# Patient Record
Sex: Male | Born: 1945 | Race: White | Hispanic: No | Marital: Single | Smoking: Former smoker
Health system: Southern US, Community
[De-identification: ages and names within clinical notes are randomized; demographics above are authoritative.]

## PROBLEM LIST (undated history)

## (undated) DIAGNOSIS — K759 Inflammatory liver disease, unspecified: Secondary | ICD-10-CM

## (undated) DIAGNOSIS — F99 Mental disorder, not otherwise specified: Secondary | ICD-10-CM

## (undated) DIAGNOSIS — F419 Anxiety disorder, unspecified: Secondary | ICD-10-CM

## (undated) DIAGNOSIS — I10 Essential (primary) hypertension: Secondary | ICD-10-CM

## (undated) DIAGNOSIS — M1990 Unspecified osteoarthritis, unspecified site: Secondary | ICD-10-CM

## (undated) DIAGNOSIS — F32A Depression, unspecified: Secondary | ICD-10-CM

## (undated) DIAGNOSIS — E119 Type 2 diabetes mellitus without complications: Secondary | ICD-10-CM

## (undated) DIAGNOSIS — K219 Gastro-esophageal reflux disease without esophagitis: Secondary | ICD-10-CM

## (undated) DIAGNOSIS — R519 Headache, unspecified: Secondary | ICD-10-CM

## (undated) DIAGNOSIS — C801 Malignant (primary) neoplasm, unspecified: Secondary | ICD-10-CM

## (undated) DIAGNOSIS — F329 Major depressive disorder, single episode, unspecified: Secondary | ICD-10-CM

## (undated) DIAGNOSIS — N281 Cyst of kidney, acquired: Secondary | ICD-10-CM

## (undated) DIAGNOSIS — Z87442 Personal history of urinary calculi: Secondary | ICD-10-CM

## (undated) DIAGNOSIS — K859 Acute pancreatitis without necrosis or infection, unspecified: Secondary | ICD-10-CM

## (undated) DIAGNOSIS — Z87891 Personal history of nicotine dependence: Secondary | ICD-10-CM

## (undated) HISTORY — DX: Personal history of nicotine dependence: Z87.891

## (undated) HISTORY — PX: TONSILLECTOMY: SUR1361

## (undated) HISTORY — PX: COLONOSCOPY: SHX174

## (undated) HISTORY — PX: EYE SURGERY: SHX253

## (undated) HISTORY — PX: CERVICAL SPINE SURGERY: SHX589

## (undated) HISTORY — PX: PILONIDAL CYST EXCISION: SHX744

## (undated) HISTORY — PX: CIRCUMCISION: SUR203

---

## 2009-06-07 ENCOUNTER — Ambulatory Visit: Payer: Self-pay | Admitting: Gastroenterology

## 2010-12-09 DIAGNOSIS — K859 Acute pancreatitis without necrosis or infection, unspecified: Secondary | ICD-10-CM

## 2010-12-09 HISTORY — DX: Acute pancreatitis without necrosis or infection, unspecified: K85.90

## 2011-06-16 ENCOUNTER — Inpatient Hospital Stay: Payer: Self-pay | Admitting: *Deleted

## 2012-01-01 ENCOUNTER — Emergency Department: Payer: Self-pay | Admitting: *Deleted

## 2012-02-18 ENCOUNTER — Ambulatory Visit: Payer: Self-pay | Admitting: Physician Assistant

## 2012-04-10 ENCOUNTER — Ambulatory Visit: Payer: Self-pay | Admitting: Family Medicine

## 2012-04-17 ENCOUNTER — Ambulatory Visit: Payer: Self-pay | Admitting: Internal Medicine

## 2012-04-17 LAB — CBC CANCER CENTER
Basophil #: 0.1 x10 3/mm (ref 0.0–0.1)
Basophil: 1 %
Comment - H1-Com1: NORMAL
Eosinophil %: 1.2 %
HCT: 49.7 % (ref 40.0–52.0)
HGB: 17.5 g/dL (ref 13.0–18.0)
Lymphocyte %: 26.8 %
Lymphocytes: 19 %
MCH: 34.7 pg — ABNORMAL HIGH (ref 26.0–34.0)
MCHC: 35.2 g/dL (ref 32.0–36.0)
MCV: 98 fL (ref 80–100)
Monocyte #: 0.9 x10 3/mm (ref 0.2–1.0)
Monocytes: 8 %
Neutrophil #: 4.4 x10 3/mm (ref 1.4–6.5)
Neutrophil %: 59.3 %
RBC: 5.05 10*6/uL (ref 4.40–5.90)
RDW: 13.1 % (ref 11.5–14.5)
Segmented Neutrophils: 60 %
Variant Lymphocyte: 12 %
WBC: 7.4 x10 3/mm (ref 3.8–10.6)

## 2012-04-17 LAB — FOLATE: Folic Acid: 29.2 ng/mL (ref 3.1–100.0)

## 2012-04-17 LAB — IRON AND TIBC
Iron Saturation: 86 %
Iron: 275 ug/dL — ABNORMAL HIGH (ref 65–175)
Unbound Iron-Bind.Cap.: 43 ug/dL

## 2012-04-17 LAB — RETICULOCYTES
Absolute Retic Count: 0.091 10*6/uL — ABNORMAL HIGH (ref 0.024–0.084)
Reticulocyte: 1.8 % — ABNORMAL HIGH (ref 0.5–1.5)

## 2012-04-27 ENCOUNTER — Ambulatory Visit: Payer: Self-pay | Admitting: Nephrology

## 2012-05-07 ENCOUNTER — Other Ambulatory Visit (HOSPITAL_COMMUNITY): Payer: Self-pay | Admitting: Gastroenterology

## 2012-05-07 DIAGNOSIS — B182 Chronic viral hepatitis C: Secondary | ICD-10-CM

## 2012-05-09 ENCOUNTER — Ambulatory Visit: Payer: Self-pay | Admitting: Internal Medicine

## 2012-05-12 ENCOUNTER — Other Ambulatory Visit: Payer: Self-pay | Admitting: Radiology

## 2012-05-13 ENCOUNTER — Ambulatory Visit: Payer: Self-pay | Admitting: Orthopedic Surgery

## 2012-05-18 ENCOUNTER — Encounter (HOSPITAL_COMMUNITY): Payer: Self-pay

## 2012-05-18 ENCOUNTER — Ambulatory Visit (HOSPITAL_COMMUNITY)
Admission: RE | Admit: 2012-05-18 | Discharge: 2012-05-18 | Disposition: A | Discharge status: Home / Self Care | Payer: Medicare Other | Source: Ambulatory Visit | Attending: Gastroenterology | Admitting: Gastroenterology

## 2012-05-18 DIAGNOSIS — B182 Chronic viral hepatitis C: Secondary | ICD-10-CM

## 2012-05-18 HISTORY — DX: Anxiety disorder, unspecified: F41.9

## 2012-05-18 HISTORY — DX: Essential (primary) hypertension: I10

## 2012-05-18 HISTORY — DX: Inflammatory liver disease, unspecified: K75.9

## 2012-05-18 LAB — PROTIME-INR: Prothrombin Time: 14 seconds (ref 11.6–15.2)

## 2012-05-18 LAB — CBC
Hemoglobin: 17.6 g/dL — ABNORMAL HIGH (ref 13.0–17.0)
MCH: 34.4 pg — ABNORMAL HIGH (ref 26.0–34.0)
MCHC: 35.1 g/dL (ref 30.0–36.0)
Platelets: 118 10*3/uL — ABNORMAL LOW (ref 150–400)

## 2012-05-18 MED ORDER — FENTANYL CITRATE 0.05 MG/ML IJ SOLN
INTRAMUSCULAR | Status: AC | PRN
  Administered 2012-05-18 (×2): 50 ug via INTRAVENOUS

## 2012-05-18 MED ORDER — SODIUM CHLORIDE 0.9 % IV SOLN: Freq: Once | INTRAVENOUS | Status: DC

## 2012-05-18 MED ORDER — MIDAZOLAM HCL 2 MG/2ML IJ SOLN
INTRAMUSCULAR | Status: AC
  Filled 2012-05-18: qty 6

## 2012-05-18 MED ORDER — FENTANYL CITRATE 0.05 MG/ML IJ SOLN
INTRAMUSCULAR | Status: AC
  Filled 2012-05-18: qty 4

## 2012-05-18 MED ORDER — MIDAZOLAM HCL 5 MG/5ML IJ SOLN
INTRAMUSCULAR | Status: AC | PRN
  Administered 2012-05-18 (×2): 1 mg via INTRAVENOUS

## 2012-05-18 NOTE — H&P (Signed)
Chief Complaint: Hepatitis C Referring Physician:Oh HPI: Herbert Chapman is an 66 y.o. male with hep C is referred for liver biopsy prior to treatment. He has no other complaints.  Past Medical History:  Past Medical History  Diagnosis Date  . Hypertension   . Hepatitis   . Anxiety     Past Surgical History:  Past Surgical History  Procedure Date  . Cervical spine surgery     Family History: No family history on file.  Social History:  does not have a smoking history on file. He does not have any smokeless tobacco history on file. His alcohol and drug histories not on file.  Allergies: No Known Allergies  Medications: ASA, Pepcid, Prozac, Microzide, Prinvil, Toprol XL  Please HPI for pertinent positives, otherwise complete 10 system ROS negative.  Physical Exam: Blood pressure 163/89, pulse 58, temperature 98.3 F (36.8 C), temperature source Oral, resp. rate 18, height 5\' 7"  (1.702 m), weight 185 lb (83.915 kg), SpO2 98.00%. Body mass index is 28.97 kg/(m^2).   General Appearance:  Alert, cooperative, no distress, appears stated age  Head:  Normocephalic, without obvious abnormality, atraumatic  ENT: Unremarkable  Neck: Supple, symmetrical, trachea midline, no adenopathy, thyroid: not enlarged, symmetric, no tenderness/mass/nodules  Lungs:   Clear to auscultation bilaterally, no w/r/r, respirations unlabored without use of accessory muscles.  Chest Wall:  No tenderness or deformity  Heart:  Regular rate and rhythm, S1, S2 normal, no murmur, rub or gallop. Carotids 2+ without bruit.  Abdomen:   Soft, non-tender, non distended. Bowel sounds active all four quadrants,  no masses, no organomegaly.  Extremities: Extremities normal, atraumatic, no cyanosis or edema  Neurologic: Normal affect, no gross deficits.   Results for orders placed during the hospital encounter of 05/18/12 (from the past 48 hour(s))  CBC     Status: Abnormal (Preliminary result)   Collection Time   05/18/12  8:57 AM      Component Value Range Comment   WBC 8.2  4.0 - 10.5 (K/uL)    RBC 5.12  4.22 - 5.81 (MIL/uL)    Hemoglobin 17.6 (*) 13.0 - 17.0 (g/dL)    HCT 91.4  78.2 - 95.6 (%)    MCV 98.0  78.0 - 100.0 (fL)    MCH 34.4 (*) 26.0 - 34.0 (pg)    MCHC 35.1  30.0 - 36.0 (g/dL)    RDW 21.3  08.6 - 57.8 (%)    Platelets PENDING  150 - 400 (K/uL)    No results found.  Assessment/Plan Hepatitis C Labs pending For US guided random biopsy today. Discussed procedure including risks and complications. Consent in chart.  Brayton El PA-C 05/18/2012, 9:29 AM

## 2012-05-18 NOTE — ED Notes (Signed)
Wound, right upper flank dressing clean dry and intact.  Pt denies discomfort

## 2012-05-18 NOTE — ED Notes (Signed)
Dressing to right upper flank clean dry and intact.  No drainage noted

## 2012-05-18 NOTE — Procedures (Signed)
Technically successful US guided biopsy of right lobe of liver.  No immediate complications.   

## 2012-05-18 NOTE — Discharge Instructions (Signed)
Liver Biopsy A liver biopsy is done to confirm or prove a suspected problem. The liver is a large organ in the upper right hand of your abdomen. To do the test, the doctor puts a small needle into the right side of your abdomen. A tiny piece of liver tissue is taken and sent for testing. This should not be painful as the skin is injected with a local anesthetic that numbs the area.  HOW A BIOPSY IS PERFORMED This is often performed as a same day surgery. This can be done in a hospital or clinic. Biopsies are often done under local anesthesia which makes the area of biopsy numb. Sometimes sedation is given to help patients relax. If you are taking blood thinning medications or medications containing aspirin, this must be discussed with your caregiver before the test. This medication may need to be stopped for up to 7 days before the procedure, or the dose may need to be changed. You should review all of your other medications with your caregiver before the test. You must remain in bed for 1 to 2 hours after the test. Having something to read may help pass the time.  LET YOUR CAREGIVERS KNOW ABOUT THE FOLLOWING:  Allergies.   Medications taken including herbs, eye drops, over -the- counter medications, and creams.   Use of steroids (by mouth or creams).   Previous problems with anesthetics or novocaine.   Possibility of pregnancy, if this applies.   History of blood clots (thrombophlebitis).   History of bleeding or blood problems.   Previous surgery.   Other health problems.  BEFORE THE PROCEDURE You should be present 60 minutes prior to your procedure or as directed. Check in at the admissions desk for filling out necessary forms if not pre-registered. There will be consent forms to sign prior to the procedure. There is a waiting area for your family while you are having your biopsy. AFTER THE PROCEDURE  After your biopsy, you will be taken to the recovery area where a nurse will watch  and check your progress.   You may have to lie on your right side for 1 to 2 hours.   Your blood pressure and pulse will be checked often.   If you are having pain or feel sick, tell your nurse.   After 1 to 2 hours, if you are going home, you may sit in a chair and get dressed. The nurse will let you know when you can get up.   Once you are doing well, barring other problems, you will be allowed to go home. Once at home, putting an ice pack on your operative site may help with discomfort and keep swelling down.   You may resume a normal diet and activities as directed.   Change dressings as directed.   Only take over-the-counter or prescription medicines for pain, discomfort, or fever as directed by your caregiver.   Call for your results as instructed by your caregiver. Remember it is your job to be sure you get the results of your biopsy and any additional tests performed on the sample taken. Do not assume everything is fine if you do not hear from your caregiver.  HOME CARE INSTRUCTIONS   You should rest for one to two days or as instructed.   You will need to have a responsible adult take you home and stay with you overnight.   Do not lift over 5 lbs. or play contact sports for two weeks.     Do not drive for 24 hours.   Do not take medication containing aspirin or drink alcohol for one week after this test.  SEEK MEDICAL CARE IF:   There is increased bleeding (more than a small spot) from the biopsy site.   You have redness, swelling, or increasing pain in the biopsy site.   You develop swelling or pain in the abdomen.   You have an unexplained oral temperature over 102 F (38.9 C).   You notice a foul smell coming from the wound or dressing.  SEEK IMMEDIATE MEDICAL CARE IF:   You develop a rash.   You have difficulty breathing.   You have allergic problems such as itching or swelling or shortness of breath.  Document Released: 02/15/2004 Document Revised:  11/14/2011 Document Reviewed: 07/05/2008 Exit[employer] Patient Information 2012 [employer]. 

## 2012-06-14 ENCOUNTER — Emergency Department: Payer: Self-pay | Admitting: Emergency Medicine

## 2012-06-14 LAB — COMPREHENSIVE METABOLIC PANEL
Bilirubin,Total: 0.5 mg/dL (ref 0.2–1.0)
Calcium, Total: 9 mg/dL (ref 8.5–10.1)
Chloride: 96 mmol/L — ABNORMAL LOW (ref 98–107)
Co2: 32 mmol/L (ref 21–32)
Creatinine: 1.14 mg/dL (ref 0.60–1.30)
EGFR (African American): >60
EGFR (Non-African Amer.): >60
Osmolality: 285 (ref 275–301)
SGPT (ALT): 93 U/L — ABNORMAL HIGH

## 2012-06-14 LAB — CBC
HCT: 50.9 % (ref 40.0–52.0)
RDW: 12.8 % (ref 11.5–14.5)
WBC: 7.6 10*3/uL (ref 3.8–10.6)

## 2012-09-29 ENCOUNTER — Encounter (HOSPITAL_COMMUNITY): Payer: Self-pay | Admitting: Pharmacy Technician

## 2012-09-29 ENCOUNTER — Other Ambulatory Visit: Payer: Self-pay | Admitting: Neurological Surgery

## 2012-10-06 ENCOUNTER — Encounter (HOSPITAL_COMMUNITY)
Admission: RE | Admit: 2012-10-06 | Discharge: 2012-10-06 | Disposition: A | Discharge status: Home / Self Care | Payer: Medicare Other | Source: Ambulatory Visit | Attending: Neurological Surgery | Admitting: Neurological Surgery

## 2012-10-06 ENCOUNTER — Ambulatory Visit (HOSPITAL_COMMUNITY)
Admission: RE | Admit: 2012-10-06 | Discharge: 2012-10-06 | Disposition: A | Discharge status: Home / Self Care | Payer: Medicare Other | Source: Ambulatory Visit | Attending: Neurological Surgery | Admitting: Neurological Surgery

## 2012-10-06 ENCOUNTER — Encounter (HOSPITAL_COMMUNITY): Payer: Self-pay

## 2012-10-06 DIAGNOSIS — E119 Type 2 diabetes mellitus without complications: Secondary | ICD-10-CM | POA: Insufficient documentation

## 2012-10-06 DIAGNOSIS — Z01812 Encounter for preprocedural laboratory examination: Secondary | ICD-10-CM | POA: Insufficient documentation

## 2012-10-06 DIAGNOSIS — Z01818 Encounter for other preprocedural examination: Secondary | ICD-10-CM | POA: Insufficient documentation

## 2012-10-06 HISTORY — DX: Major depressive disorder, single episode, unspecified: F32.9

## 2012-10-06 HISTORY — DX: Depression, unspecified: F32.A

## 2012-10-06 HISTORY — DX: Gastro-esophageal reflux disease without esophagitis: K21.9

## 2012-10-06 HISTORY — DX: Malignant (primary) neoplasm, unspecified: C80.1

## 2012-10-06 HISTORY — DX: Mental disorder, not otherwise specified: F99

## 2012-10-06 HISTORY — DX: Type 2 diabetes mellitus without complications: E11.9

## 2012-10-06 HISTORY — DX: Unspecified osteoarthritis, unspecified site: M19.90

## 2012-10-06 HISTORY — DX: Acute pancreatitis without necrosis or infection, unspecified: K85.90

## 2012-10-06 LAB — SURGICAL PCR SCREEN: MRSA, PCR: NEGATIVE

## 2012-10-06 LAB — COMPREHENSIVE METABOLIC PANEL
Albumin: 3.9 g/dL (ref 3.5–5.2)
Alkaline Phosphatase: 92 U/L (ref 39–117)
BUN: 18 mg/dL (ref 6–23)
CO2: 29 mEq/L (ref 19–32)
Chloride: 101 mEq/L (ref 96–112)
Creatinine, Ser: 0.95 mg/dL (ref 0.50–1.35)
GFR calc non Af Amer: 84 mL/min — ABNORMAL LOW (ref >90)
Potassium: 4.9 mEq/L (ref 3.5–5.1)
Total Bilirubin: 0.6 mg/dL (ref 0.3–1.2)

## 2012-10-06 LAB — CBC WITH DIFFERENTIAL/PLATELET
Basophils Absolute: 0 10*3/uL (ref 0.0–0.1)
Basophils Relative: 0 % (ref 0–1)
Eosinophils Absolute: 0 10*3/uL (ref 0.0–0.7)
HCT: 43.3 % (ref 39.0–52.0)
Hemoglobin: 14.9 g/dL (ref 13.0–17.0)
MCH: 34.1 pg — ABNORMAL HIGH (ref 26.0–34.0)
MCHC: 34.4 g/dL (ref 30.0–36.0)
Monocytes Absolute: 0.7 10*3/uL (ref 0.1–1.0)
Monocytes Relative: 16 % — ABNORMAL HIGH (ref 3–12)
Neutrophils Relative %: 47 % (ref 43–77)
RDW: 13.9 % (ref 11.5–15.5)

## 2012-10-06 LAB — PROTIME-INR: Prothrombin Time: 13 seconds (ref 11.6–15.2)

## 2012-10-06 MED ORDER — DEXAMETHASONE SODIUM PHOSPHATE 10 MG/ML IJ SOLN
10.0000 mg | INTRAMUSCULAR | Status: AC
  Administered 2012-10-07: 10 mg via INTRAVENOUS
  Filled 2012-10-06: qty 1

## 2012-10-06 MED ORDER — CEFAZOLIN SODIUM-DEXTROSE 2-3 GM-% IV SOLR
2.0000 g | INTRAVENOUS | Status: AC
  Administered 2012-10-07: 2 g via INTRAVENOUS
  Filled 2012-10-06: qty 50

## 2012-10-06 NOTE — Progress Notes (Signed)
Patient informed Nurse that he had a stress test several years ago but was unaware of location. Patient denied having a cardiac cath, or sleep study.

## 2012-10-07 ENCOUNTER — Inpatient Hospital Stay (HOSPITAL_COMMUNITY): Payer: Medicare Other | Admitting: Anesthesiology

## 2012-10-07 ENCOUNTER — Encounter (HOSPITAL_COMMUNITY): Payer: Self-pay | Admitting: *Deleted

## 2012-10-07 ENCOUNTER — Encounter (HOSPITAL_COMMUNITY): Payer: Self-pay | Admitting: Anesthesiology

## 2012-10-07 ENCOUNTER — Encounter (HOSPITAL_COMMUNITY)
Admission: RE | Disposition: A | Discharge status: Home / Self Care | Payer: Self-pay | Source: Ambulatory Visit | Attending: Neurological Surgery

## 2012-10-07 ENCOUNTER — Ambulatory Visit (HOSPITAL_COMMUNITY)
Admission: RE | Admit: 2012-10-07 | Discharge: 2012-10-07 | Disposition: A | Discharge status: Home / Self Care | Payer: Medicare Other | Source: Ambulatory Visit | Attending: Neurological Surgery | Admitting: Neurological Surgery

## 2012-10-07 ENCOUNTER — Inpatient Hospital Stay (HOSPITAL_COMMUNITY): Payer: Medicare Other

## 2012-10-07 DIAGNOSIS — B1920 Unspecified viral hepatitis C without hepatic coma: Secondary | ICD-10-CM | POA: Insufficient documentation

## 2012-10-07 DIAGNOSIS — Z79899 Other long term (current) drug therapy: Secondary | ICD-10-CM | POA: Insufficient documentation

## 2012-10-07 DIAGNOSIS — I10 Essential (primary) hypertension: Secondary | ICD-10-CM | POA: Insufficient documentation

## 2012-10-07 DIAGNOSIS — E119 Type 2 diabetes mellitus without complications: Secondary | ICD-10-CM | POA: Insufficient documentation

## 2012-10-07 DIAGNOSIS — Z01812 Encounter for preprocedural laboratory examination: Secondary | ICD-10-CM | POA: Insufficient documentation

## 2012-10-07 DIAGNOSIS — Z01810 Encounter for preprocedural cardiovascular examination: Secondary | ICD-10-CM | POA: Insufficient documentation

## 2012-10-07 DIAGNOSIS — M48061 Spinal stenosis, lumbar region without neurogenic claudication: Secondary | ICD-10-CM | POA: Insufficient documentation

## 2012-10-07 DIAGNOSIS — Z01818 Encounter for other preprocedural examination: Secondary | ICD-10-CM | POA: Insufficient documentation

## 2012-10-07 DIAGNOSIS — M47817 Spondylosis without myelopathy or radiculopathy, lumbosacral region: Principal | ICD-10-CM | POA: Insufficient documentation

## 2012-10-07 DIAGNOSIS — Z9889 Other specified postprocedural states: Secondary | ICD-10-CM

## 2012-10-07 HISTORY — PX: LUMBAR LAMINECTOMY/DECOMPRESSION MICRODISCECTOMY: SHX5026

## 2012-10-07 LAB — GLUCOSE, CAPILLARY
Glucose-Capillary: 123 mg/dL — ABNORMAL HIGH (ref 70–99)
Glucose-Capillary: 164 mg/dL — ABNORMAL HIGH (ref 70–99)
Glucose-Capillary: 269 mg/dL — ABNORMAL HIGH (ref 70–99)

## 2012-10-07 SURGERY — LUMBAR LAMINECTOMY/DECOMPRESSION MICRODISCECTOMY 2 LEVELS
Anesthesia: General | Site: Spine Lumbar | Laterality: Left | Wound class: Clean

## 2012-10-07 MED ORDER — MEPERIDINE HCL 25 MG/ML IJ SOLN: 6.2500 mg | INTRAMUSCULAR | Status: DC | PRN

## 2012-10-07 MED ORDER — OXYCODONE HCL 5 MG/5ML PO SOLN: 5.0000 mg | Freq: Once | ORAL | Status: AC | PRN

## 2012-10-07 MED ORDER — FENTANYL CITRATE 0.05 MG/ML IJ SOLN
INTRAMUSCULAR | Status: DC | PRN
  Administered 2012-10-07 (×3): 50 ug via INTRAVENOUS

## 2012-10-07 MED ORDER — ZOLPIDEM TARTRATE 5 MG PO TABS: 5.0000 mg | ORAL_TABLET | Freq: Every evening | ORAL | Status: DC | PRN

## 2012-10-07 MED ORDER — ONDANSETRON HCL 4 MG/2ML IJ SOLN: 4.0000 mg | INTRAMUSCULAR | Status: DC | PRN

## 2012-10-07 MED ORDER — BACITRACIN 50000 UNITS IM SOLR
INTRAMUSCULAR | Status: AC
  Filled 2012-10-07: qty 1

## 2012-10-07 MED ORDER — EPHEDRINE SULFATE 50 MG/ML IJ SOLN
INTRAMUSCULAR | Status: DC | PRN
  Administered 2012-10-07: 5 mg via INTRAVENOUS
  Administered 2012-10-07 (×2): 10 mg via INTRAVENOUS
  Administered 2012-10-07: 5 mg via INTRAVENOUS
  Administered 2012-10-07: 10 mg via INTRAVENOUS

## 2012-10-07 MED ORDER — RIBAVIRIN 200 MG PO CAPS
600.0000 mg | ORAL_CAPSULE | Freq: Two times a day (BID) | ORAL | Status: DC
  Filled 2012-10-07: qty 3

## 2012-10-07 MED ORDER — SODIUM CHLORIDE 0.9 % IJ SOLN
3.0000 mL | Freq: Two times a day (BID) | INTRAMUSCULAR | Status: DC
  Administered 2012-10-07: 3 mL via INTRAVENOUS

## 2012-10-07 MED ORDER — ARTIFICIAL TEARS OP OINT
TOPICAL_OINTMENT | OPHTHALMIC | Status: DC | PRN
  Administered 2012-10-07: 1 via OPHTHALMIC

## 2012-10-07 MED ORDER — HYDROCODONE-ACETAMINOPHEN 5-325 MG PO TABS: 1.0000 | ORAL_TABLET | Freq: Four times a day (QID) | ORAL | Status: DC | PRN

## 2012-10-07 MED ORDER — HYDROCHLOROTHIAZIDE 25 MG PO TABS
25.0000 mg | ORAL_TABLET | Freq: Every day | ORAL | Status: DC
Start: 2012-10-07 — End: 2012-10-07
  Administered 2012-10-07: 25 mg via ORAL
  Filled 2012-10-07: qty 1

## 2012-10-07 MED ORDER — SENNA 8.6 MG PO TABS: 1.0000 | ORAL_TABLET | Freq: Two times a day (BID) | ORAL | Status: DC

## 2012-10-07 MED ORDER — LIDOCAINE HCL (CARDIAC) 20 MG/ML IV SOLN
INTRAVENOUS | Status: DC | PRN
  Administered 2012-10-07: 50 mg via INTRAVENOUS

## 2012-10-07 MED ORDER — VECURONIUM BROMIDE 10 MG IV SOLR
INTRAVENOUS | Status: DC | PRN
  Administered 2012-10-07: 10 mg via INTRAVENOUS

## 2012-10-07 MED ORDER — ACETAMINOPHEN 325 MG PO TABS: 650.0000 mg | ORAL_TABLET | ORAL | Status: DC | PRN

## 2012-10-07 MED ORDER — CYCLOBENZAPRINE HCL 10 MG PO TABS
10.0000 mg | ORAL_TABLET | Freq: Three times a day (TID) | ORAL | Status: DC | PRN
  Administered 2012-10-07: 10 mg via ORAL
  Filled 2012-10-07: qty 1

## 2012-10-07 MED ORDER — 0.9 % SODIUM CHLORIDE (POUR BTL) OPTIME
TOPICAL | Status: DC | PRN
  Administered 2012-10-07: 1000 mL

## 2012-10-07 MED ORDER — FLUOXETINE HCL 20 MG PO CAPS
20.0000 mg | ORAL_CAPSULE | Freq: Three times a day (TID) | ORAL | Status: DC
  Administered 2012-10-07: 20 mg via ORAL
  Filled 2012-10-07 (×2): qty 1

## 2012-10-07 MED ORDER — SODIUM CHLORIDE 0.9 % IR SOLN
Status: DC | PRN
  Administered 2012-10-07: 09:00:00

## 2012-10-07 MED ORDER — ACETAMINOPHEN 650 MG RE SUPP: 650.0000 mg | RECTAL | Status: DC | PRN

## 2012-10-07 MED ORDER — MIDAZOLAM HCL 5 MG/5ML IJ SOLN
INTRAMUSCULAR | Status: DC | PRN
  Administered 2012-10-07: 1 mg via INTRAVENOUS

## 2012-10-07 MED ORDER — MENTHOL 3 MG MT LOZG: 1.0000 | LOZENGE | OROMUCOSAL | Status: DC | PRN

## 2012-10-07 MED ORDER — HEMOSTATIC AGENTS (NO CHARGE) OPTIME
TOPICAL | Status: DC | PRN
  Administered 2012-10-07: 1 via TOPICAL

## 2012-10-07 MED ORDER — MORPHINE SULFATE 2 MG/ML IJ SOLN
1.0000 mg | INTRAMUSCULAR | Status: DC | PRN
Start: 2012-10-07 — End: 2012-10-07

## 2012-10-07 MED ORDER — OXYCODONE HCL 5 MG PO TABS
ORAL_TABLET | ORAL | Status: AC
  Filled 2012-10-07: qty 1

## 2012-10-07 MED ORDER — HYDROCODONE-ACETAMINOPHEN 5-325 MG PO TABS
1.0000 | ORAL_TABLET | ORAL | Status: DC | PRN
  Administered 2012-10-07 (×2): 2 via ORAL
  Filled 2012-10-07 (×2): qty 2

## 2012-10-07 MED ORDER — SODIUM CHLORIDE 0.9 % IV SOLN
INTRAVENOUS | Status: AC
  Filled 2012-10-07: qty 500

## 2012-10-07 MED ORDER — OXYCODONE HCL 5 MG PO TABS
5.0000 mg | ORAL_TABLET | Freq: Once | ORAL | Status: AC | PRN
  Administered 2012-10-07: 5 mg via ORAL

## 2012-10-07 MED ORDER — METOPROLOL SUCCINATE ER 100 MG PO TB24: 100.0000 mg | ORAL_TABLET | Freq: Every day | ORAL | Status: DC

## 2012-10-07 MED ORDER — BUPIVACAINE HCL (PF) 0.25 % IJ SOLN
INTRAMUSCULAR | Status: DC | PRN
  Administered 2012-10-07: 6 mL

## 2012-10-07 MED ORDER — PHENOL 1.4 % MT LIQD: 1.0000 | OROMUCOSAL | Status: DC | PRN

## 2012-10-07 MED ORDER — LISINOPRIL 20 MG PO TABS
20.0000 mg | ORAL_TABLET | Freq: Every day | ORAL | Status: DC
Start: 2012-10-07 — End: 2012-10-07
  Administered 2012-10-07: 20 mg via ORAL
  Filled 2012-10-07: qty 1

## 2012-10-07 MED ORDER — NEOSTIGMINE METHYLSULFATE 1 MG/ML IJ SOLN
INTRAMUSCULAR | Status: DC | PRN
  Administered 2012-10-07: 3 mg via INTRAVENOUS

## 2012-10-07 MED ORDER — LACTATED RINGERS IV SOLN
INTRAVENOUS | Status: DC | PRN
  Administered 2012-10-07 (×2): via INTRAVENOUS

## 2012-10-07 MED ORDER — HYDROMORPHONE HCL PF 1 MG/ML IJ SOLN
0.2500 mg | INTRAMUSCULAR | Status: DC | PRN
  Administered 2012-10-07 (×2): 0.5 mg via INTRAVENOUS

## 2012-10-07 MED ORDER — METFORMIN HCL 500 MG PO TABS
500.0000 mg | ORAL_TABLET | Freq: Two times a day (BID) | ORAL | Status: DC
  Administered 2012-10-07: 500 mg via ORAL
  Filled 2012-10-07 (×2): qty 1

## 2012-10-07 MED ORDER — HYDROMORPHONE HCL PF 1 MG/ML IJ SOLN
INTRAMUSCULAR | Status: AC
  Filled 2012-10-07: qty 1

## 2012-10-07 MED ORDER — POTASSIUM CHLORIDE IN NACL 20-0.9 MEQ/L-% IV SOLN
INTRAVENOUS | Status: DC
  Filled 2012-10-07 (×2): qty 1000

## 2012-10-07 MED ORDER — GLYCOPYRROLATE 0.2 MG/ML IJ SOLN
INTRAMUSCULAR | Status: DC | PRN
  Administered 2012-10-07: .6 mg via INTRAVENOUS

## 2012-10-07 MED ORDER — THROMBIN 5000 UNITS EX KIT
PACK | CUTANEOUS | Status: DC | PRN
  Administered 2012-10-07 (×2): 5000 [IU] via TOPICAL

## 2012-10-07 MED ORDER — SODIUM CHLORIDE 0.9 % IJ SOLN: 3.0000 mL | INTRAMUSCULAR | Status: DC | PRN

## 2012-10-07 MED ORDER — CEFAZOLIN SODIUM 1-5 GM-% IV SOLN
1.0000 g | Freq: Three times a day (TID) | INTRAVENOUS | Status: DC
  Administered 2012-10-07: 1 g via INTRAVENOUS
  Filled 2012-10-07 (×2): qty 50

## 2012-10-07 MED ORDER — PROPOFOL 10 MG/ML IV BOLUS
INTRAVENOUS | Status: DC | PRN
  Administered 2012-10-07: 200 mg via INTRAVENOUS

## 2012-10-07 MED ORDER — ACETAMINOPHEN 10 MG/ML IV SOLN
1000.0000 mg | Freq: Four times a day (QID) | INTRAVENOUS | Status: DC
  Administered 2012-10-07 (×2): 1000 mg via INTRAVENOUS
  Filled 2012-10-07 (×4): qty 100

## 2012-10-07 MED ORDER — PROMETHAZINE HCL 25 MG/ML IJ SOLN: 6.2500 mg | INTRAMUSCULAR | Status: DC | PRN

## 2012-10-07 SURGICAL SUPPLY — 46 items
BAG DECANTER FOR FLEXI CONT (MISCELLANEOUS) ×2 IMPLANT
BENZOIN TINCTURE PRP APPL 2/3 (GAUZE/BANDAGES/DRESSINGS) ×2 IMPLANT
BUR MATCHSTICK NEURO 3.0 LAGG (BURR) ×2 IMPLANT
CANISTER SUCTION 2500CC (MISCELLANEOUS) ×2 IMPLANT
CLOTH BEACON ORANGE TIMEOUT ST (SAFETY) ×2 IMPLANT
CONT SPEC 4OZ CLIKSEAL STRL BL (MISCELLANEOUS) ×2 IMPLANT
DRAPE LAPAROTOMY 100X72X124 (DRAPES) ×2 IMPLANT
DRAPE MICROSCOPE LEICA (MISCELLANEOUS) ×2 IMPLANT
DRAPE MICROSCOPE ZEISS OPMI (DRAPES) IMPLANT
DRAPE POUCH INSTRU U-SHP 10X18 (DRAPES) ×2 IMPLANT
DRAPE SURG 17X23 STRL (DRAPES) ×2 IMPLANT
DRESSING TELFA 8X3 (GAUZE/BANDAGES/DRESSINGS) ×2 IMPLANT
DRSG OPSITE 4X5.5 SM (GAUZE/BANDAGES/DRESSINGS) ×2 IMPLANT
DURAPREP 26ML APPLICATOR (WOUND CARE) ×2 IMPLANT
ELECT REM PT RETURN 9FT ADLT (ELECTROSURGICAL) ×2
ELECTRODE REM PT RTRN 9FT ADLT (ELECTROSURGICAL) ×1 IMPLANT
EVACUATOR 1/8 PVC DRAIN (DRAIN) ×2 IMPLANT
GAUZE SPONGE 4X4 16PLY XRAY LF (GAUZE/BANDAGES/DRESSINGS) IMPLANT
GLOVE BIO SURGEON STRL SZ8 (GLOVE) ×2 IMPLANT
GLOVE BIOGEL PI IND STRL 8 (GLOVE) ×3 IMPLANT
GLOVE BIOGEL PI IND STRL 8.5 (GLOVE) ×1 IMPLANT
GLOVE BIOGEL PI INDICATOR 8 (GLOVE) ×3
GLOVE BIOGEL PI INDICATOR 8.5 (GLOVE) ×1
GLOVE ECLIPSE 7.5 STRL STRAW (GLOVE) ×8 IMPLANT
GOWN BRE IMP SLV AUR LG STRL (GOWN DISPOSABLE) ×2 IMPLANT
GOWN BRE IMP SLV AUR XL STRL (GOWN DISPOSABLE) ×2 IMPLANT
GOWN STRL REIN 2XL LVL4 (GOWN DISPOSABLE) ×2 IMPLANT
HEMOSTAT POWDER KIT SURGIFOAM (HEMOSTASIS) IMPLANT
KIT BASIN OR (CUSTOM PROCEDURE TRAY) ×2 IMPLANT
KIT ROOM TURNOVER OR (KITS) ×2 IMPLANT
NEEDLE HYPO 25X1 1.5 SAFETY (NEEDLE) ×2 IMPLANT
NEEDLE SPNL 20GX3.5 QUINCKE YW (NEEDLE) ×2 IMPLANT
NS IRRIG 1000ML POUR BTL (IV SOLUTION) ×2 IMPLANT
PACK LAMINECTOMY NEURO (CUSTOM PROCEDURE TRAY) ×2 IMPLANT
PAD ARMBOARD 7.5X6 YLW CONV (MISCELLANEOUS) ×10 IMPLANT
RUBBERBAND STERILE (MISCELLANEOUS) ×4 IMPLANT
SPONGE SURGIFOAM ABS GEL SZ50 (HEMOSTASIS) ×2 IMPLANT
STRIP CLOSURE SKIN 1/2X4 (GAUZE/BANDAGES/DRESSINGS) ×2 IMPLANT
SUT VIC AB 0 CT1 18XCR BRD8 (SUTURE) ×1 IMPLANT
SUT VIC AB 0 CT1 8-18 (SUTURE) ×1
SUT VIC AB 2-0 CP2 18 (SUTURE) ×2 IMPLANT
SUT VIC AB 3-0 SH 8-18 (SUTURE) ×2 IMPLANT
SYR 20ML ECCENTRIC (SYRINGE) ×2 IMPLANT
TOWEL OR 17X24 6PK STRL BLUE (TOWEL DISPOSABLE) ×2 IMPLANT
TOWEL OR 17X26 10 PK STRL BLUE (TOWEL DISPOSABLE) ×2 IMPLANT
WATER STERILE IRR 1000ML POUR (IV SOLUTION) ×2 IMPLANT

## 2012-10-07 NOTE — Anesthesia Preprocedure Evaluation (Addendum)
Anesthesia Evaluation  Patient identified by MRN, date of birth, ID band Patient awake    Reviewed: Allergy & Precautions, H&P , NPO status , Patient's Chart, lab work & pertinent test results, reviewed documented beta blocker date and time   Airway Mallampati: II TM Distance: >3 FB Neck ROM: full    Dental  (+) Teeth Intact and Dental Advidsory Given   Pulmonary  breath sounds clear to auscultation        Cardiovascular hypertension, On Home Beta Blockers Rhythm:Regular Rate:Normal     Neuro/Psych    GI/Hepatic GERD-  Medicated and Controlled,(+) Hepatitis -, C  Endo/Other  diabetes, Well Controlled, Type 2  Renal/GU      Musculoskeletal   Abdominal   Peds  Hematology   Anesthesia Other Findings   Reproductive/Obstetrics                          Anesthesia Physical Anesthesia Plan  ASA: II  Anesthesia Plan: General   Post-op Pain Management:    Induction: Intravenous  Airway Management Planned: Oral ETT  Additional Equipment:   Intra-op Plan:   Post-operative Plan: Extubation in OR  Informed Consent: I have reviewed the patients History and Physical, chart, labs and discussed the procedure including the risks, benefits and alternatives for the proposed anesthesia with the patient or authorized representative who has indicated his/her understanding and acceptance.   Dental Advisory Given and Dental advisory given  Plan Discussed with: Anesthesiologist, CRNA and Surgeon  Anesthesia Plan Comments:        Anesthesia Quick Evaluation

## 2012-10-07 NOTE — Discharge Summary (Signed)
Physician Discharge Summary  Patient ID: Herbert Chapman MRN: [id number] DOB/AGE: Apr 14, 1946 66 y.o.  Admit date: 10/07/2012 Discharge date: 10/07/2012  Admission Diagnoses: Lumbar spinal stenosis    Discharge Diagnoses: Same   Discharged Condition: good  Hospital Course: The patient was admitted on 10/07/2012 and taken to the operating room where the patient underwent decompressive lumbar laminectomy. The patient tolerated the procedure well and was taken to the recovery room and then to the floor in stable condition. The hospital course was routine. There were no complications. The wound remained clean dry and intact. Pt had appropriate back soreness. No complaints of leg pain or new N/T/W. The patient remained afebrile with stable vital signs, and tolerated a regular diet. The patient continued to increase activities, and pain was well controlled with oral pain medications.   Consults: None  Significant Diagnostic Studies:  Results for orders placed during the hospital encounter of 10/07/12  GLUCOSE, CAPILLARY      Component Value Range   Glucose-Capillary 130 (*) 70 - 99 mg/dL  GLUCOSE, CAPILLARY      Component Value Range   Glucose-Capillary 123 (*) 70 - 99 mg/dL  GLUCOSE, CAPILLARY      Component Value Range   Glucose-Capillary 164 (*) 70 - 99 mg/dL    Chest 2 View  40/98/1191  *RADIOLOGY REPORT*  Clinical Data: Preoperative evaluation for lumbar spine surgery. History of diabetes  CHEST - 2 VIEW  Comparison: None.  Findings: Heart and mediastinal contours are within normal limits. Lung fields appear clear with no signs of focal infiltrate or congestive failure.  No pleural fluid or significant peribronchial cuffing is noted.  Bony structures demonstrate mild degenerative changes of the thoracic spine are otherwise intact.  IMPRESSION: No worrisome focal or acute abnormality identified   Original Report Authenticated By: Bertha Stakes, M.D.    Dg Lumbar Spine 2-3  Views  10/07/2012  *RADIOLOGY REPORT*  Clinical Data: Lumbar hemilaminectomy.  LUMBAR SPINE - 2-3 VIEW  Comparison: None  Findings: First lateral intraoperative image demonstrates posterior surgical instruments directed at the L3-4 and L4-5 levels.  Second lateral intraoperative image demonstrates posterior surgical instruments at L2-3.  IMPRESSION: Intraoperative localization as above.   Original Report Authenticated By: Cyndie Chime, M.D.     Antibiotics:  Anti-infectives     Start     Dose/Rate Route Frequency Ordered Stop   10/07/12 2200   ribavirin (REBETOL) capsule 600 mg        600 mg Oral 2 times daily 10/07/12 1124     10/07/12 1600   ceFAZolin (ANCEF) IVPB 1 g/50 mL premix        1 g 100 mL/hr over 30 Minutes Intravenous Every 8 hours 10/07/12 1124 10/08/12 0759   10/07/12 0915   bacitracin 50,000 Units in sodium chloride irrigation 0.9 % 500 mL irrigation  Status:  Discontinued          As needed 10/07/12 0916 10/07/12 1001   10/07/12 0751   bacitracin 47829 UNITS injection     Comments: DAY, DORY: cabinet override         10/07/12 0751 10/07/12 1959   10/07/12 0600   ceFAZolin (ANCEF) IVPB 2 g/50 mL premix        2 g 100 mL/hr over 30 Minutes Intravenous On call to O.R. 10/06/12 1435 10/07/12 0835          Discharge Exam: Blood pressure 130/76, pulse 88, temperature 96 F (35.6 C), temperature source Axillary, resp. rate  17, SpO2 92.00%. Neurologic: Grossly normal Incision clean dry and intact  Discharge Medications:     Medication List     As of 10/07/2012  3:55 PM    TAKE these medications         aspirin 81 MG tablet   Take 162 mg by mouth daily.      famotidine 20 MG tablet   Commonly known as: PEPCID   Take 20 mg by mouth daily as needed. For reflux      FLUoxetine 20 MG capsule   Commonly known as: PROZAC   Take 20 mg by mouth 3 (three) times daily.      hydrochlorothiazide 25 MG tablet   Commonly known as: HYDRODIURIL   Take 25 mg by  mouth daily.      HYDROcodone-acetaminophen 5-325 MG per tablet   Commonly known as: NORCO/VICODIN   Take 1-2 tablets by mouth every 6 (six) hours as needed for pain.      lisinopril 20 MG tablet   Commonly known as: PRINIVIL,ZESTRIL   Take 20 mg by mouth daily.      metFORMIN 500 MG tablet   Commonly known as: GLUCOPHAGE   Take 500 mg by mouth 2 (two) times daily with a meal.      metoprolol succinate 100 MG 24 hr tablet   Commonly known as: TOPROL-XL   Take 100 mg by mouth daily. Take with or immediately following a meal.      peginterferon alfa-2b 50 MCG/0.5ML injection   Commonly known as: PEG-INTRON   Inject 50 mcg into the skin once a week.      ribavirin 200 MG capsule   Commonly known as: REBETOL   Take 600 mg by mouth 2 (two) times daily.        Disposition: Home  Final Dx: Decompressive lumbar laminectomy     Discharge Orders    Future Orders Please Complete By Expires   Diet - low sodium heart healthy      Increase activity slowly      Discharge instructions      Comments:   Avoid bending twisting or lifting   Remove dressing in 48 hours      Call MD for:  temperature >100.4      Call MD for:  persistant nausea and vomiting      Call MD for:  severe uncontrolled pain      Call MD for:  redness, tenderness, or signs of infection (pain, swelling, redness, odor or green/yellow discharge around incision site)      Call MD for:  difficulty breathing, headache or visual disturbances      Call MD for:  hives         Follow-up Information    Follow up with Melitza Metheny S, MD. Schedule an appointment as soon as possible for a visit in 2 weeks.   Contact information:   [address] [place] [address] [phone]           Signed: Tia Alert 10/07/2012, 3:55 PM

## 2012-10-07 NOTE — Preoperative (Signed)
Beta Blockers   Reason not to administer Beta Blockers:Not Applicable 

## 2012-10-07 NOTE — Plan of Care (Signed)
Problem: Consults Goal: Diagnosis - Spinal Surgery Outcome: Completed/Met Date Met:  10/07/12 Lumbar Laminectomy (Complex)     

## 2012-10-07 NOTE — Op Note (Signed)
10/07/2012  9:59 AM  PATIENT:  Herbert Chapman  66 y.o. male  PRE-OPERATIVE DIAGNOSIS:  Lumbar spondylosis with lumbar spinal stenosis L2-3 and L4-5 with left leg pain  POST-OPERATIVE DIAGNOSIS:  Same  PROCEDURE:  Decompressive hemilaminectomy, medial facetectomy, and foraminotomies L2-3 and L4-5 on the left  SURGEON:  Marikay Alar, MD  ASSISTANTS: Dr. Newell Coral  ANESTHESIA:   General  EBL: Less than 100 ml  Total I/O In: 1500 [I.V.:1500] Out: 100 [Blood:100]  BLOOD ADMINISTERED:none  DRAINS: Medium Hemovac   SPECIMEN:  No Specimen  INDICATION FOR PROCEDURE: This patient presented with significant left leg pain that failed to respond to medical management and epidural steroid injections. He had an MRI which showed significant spinal stenosis at L2-3 and L4-5. There was a grade 1 spondylolisthesis at L4-5. Recommended simple decompressive hemilaminectomy at both levels to decompress the left side of the canal and hopefully improve his left leg pain. Patient understood the risks, benefits, and alternatives and potential outcomes and wished to proceed.  PROCEDURE DETAILS: The patient was taken to the operating room and after induction of adequate generalized endotracheal anesthesia, the patient was rolled into the prone position on the Wilson frame and all pressure points were padded. The lumbar region was cleaned and then prepped with DuraPrep and draped in the usual sterile fashion. 5 cc of local anesthesia was injected and then a dorsal midline incision was made and carried down to the lumbo sacral fascia. The fascia was opened and the paraspinous musculature was taken down in a subperiosteal fashion to expose L2-3 L3-4 and L4-5 on the left. Intraoperative x-ray confirmed my level, and then I used a combination of the high-speed drill and the Kerrison punches to perform a hemilaminectomy, medial facetectomy, and foraminotomy at  L2-3 and L4-5 on the left. The underlying yellow ligament was  opened and removed in a piecemeal fashion to expose the underlying dura and exiting nerve root at each level. I undercut the lateral recess and dissected down until I was medial to and distal to the pedicle. The nerve root was well decompressed at each level. I then palpated with a coronary dilator along the nerve root and into the foramen to assure adequate decompression. I felt no more compression of the nerve root. I also retracted the nerve root medially to inspected the disc space to make sure there was no disc protrusion. I irrigated with saline solution containing bacitracin. Achieved hemostasis with bipolar cautery, lined the dura with Gelfoam, placed a medium Hemovac drain through separate stab incision and then closed the fascia with 0 Vicryl. I closed the subcutaneous tissues with 2-0 Vicryl and the subcuticular tissues with 3-0 Vicryl. The skin was then closed with benzoin and Steri-Strips. The drapes were removed, a sterile dressing was applied. The patient was awakened from general anesthesia and transferred to the recovery room in stable condition. At the end of the procedure all sponge, needle and instrument counts were correct.   PLAN OF CARE: Admit for overnight observation  PATIENT DISPOSITION:  PACU - hemodynamically stable.   Delay start of Pharmacological VTE agent (>24hrs) due to surgical blood loss or risk of bleeding:  yes

## 2012-10-07 NOTE — Anesthesia Procedure Notes (Signed)
Procedure Name: Intubation Date/Time: 10/07/2012 8:44 AM Performed by: Carmela Rima Pre-anesthesia Checklist: Patient identified, Timeout performed, Emergency Drugs available, Suction available and Patient being monitored Patient Re-evaluated:Patient Re-evaluated prior to inductionOxygen Delivery Method: Circle system utilized Preoxygenation: Pre-oxygenation with 100% oxygen Intubation Type: IV induction Ventilation: Mask ventilation without difficulty Laryngoscope Size: Mac and 3 Grade View: Grade II Tube type: Oral Tube size: 7.5 mm Number of attempts: 1 Intubation method: anterior anatomy. Placement Confirmation: ETT inserted through vocal cords under direct vision,  positive ETCO2 and breath sounds checked- equal and bilateral Secured at: 24 cm Tube secured with: Tape Dental Injury: Teeth and Oropharynx as per pre-operative assessment

## 2012-10-07 NOTE — Anesthesia Postprocedure Evaluation (Signed)
  Anesthesia Post-op Note  Patient: Herbert Chapman  Procedure(s) Performed: Procedure(s) (LRB) with comments: LUMBAR LAMINECTOMY/DECOMPRESSION MICRODISCECTOMY 2 LEVELS (Left) - Left Lumbar two-three,lumbar four-five hemilaminectomy  Patient Location: PACU  Anesthesia Type:General  Level of Consciousness: awake  Airway and Oxygen Therapy: Patient Spontanous Breathing  Post-op Pain: mild  Post-op Assessment: Post-op Vital signs reviewed  Post-op Vital Signs: stable  Complications: No apparent anesthesia complications

## 2012-10-07 NOTE — Transfer of Care (Signed)
Immediate Anesthesia Transfer of Care Note  Patient: Herbert Chapman  Procedure(s) Performed: Procedure(s) (LRB) with comments: LUMBAR LAMINECTOMY/DECOMPRESSION MICRODISCECTOMY 2 LEVELS (Left) - Left Lumbar two-three,lumbar four-five hemilaminectomy  Patient Location: PACU  Anesthesia Type:General  Level of Consciousness: awake, alert  and oriented  Airway & Oxygen Therapy: Patient Spontanous Breathing and Patient connected to nasal cannula oxygen  Post-op Assessment: Report given to PACU RN, Post -op Vital signs reviewed and stable and Patient moving all extremities X 4  Post vital signs: Reviewed and stable  Complications: No apparent anesthesia complications

## 2012-10-07 NOTE — H&P (Signed)
Subjective: Patient is a 66 y.o. male admitted for DLL L2-3, L4-5 Left. Onset of symptoms was several months ago, gradually worsening since that time.  The pain is rated severe, and is located at the across the lower back and radiates to LLE. The pain is described as aching and occurs intermittently. The symptoms have been progressive. Symptoms are exacerbated by exercise. MRI or CT showed stenosis/ spondylolisthesis.   Past Medical History  Diagnosis Date  . Hypertension   . Anxiety   . GERD (gastroesophageal reflux disease)   . Diabetes mellitus without complication   . Mental disorder   . Depression   . Pancreatitis 2012  . Cancer     squamous cell CA removed from skin  . Arthritis   . Hepatitis     Hep C    Past Surgical History  Procedure Date  . Cervical spine surgery   . Tonsillectomy   . Eye surgery     bilateral cataract removal; implants  . Colonoscopy     Prior to Admission medications   Medication Sig Start Date End Date Taking? Authorizing Provider  aspirin 81 MG tablet Take 162 mg by mouth daily.   Yes Historical Provider, MD  famotidine (PEPCID) 20 MG tablet Take 20 mg by mouth daily as needed. For reflux   Yes Historical Provider, MD  FLUoxetine (PROZAC) 20 MG capsule Take 20 mg by mouth 3 (three) times daily.    Yes Historical Provider, MD  hydrochlorothiazide (HYDRODIURIL) 25 MG tablet Take 25 mg by mouth daily.   Yes Historical Provider, MD  lisinopril (PRINIVIL,ZESTRIL) 20 MG tablet Take 20 mg by mouth daily.   Yes Historical Provider, MD  metFORMIN (GLUCOPHAGE) 500 MG tablet Take 500 mg by mouth 2 (two) times daily with a meal.   Yes Historical Provider, MD  metoprolol succinate (TOPROL-XL) 100 MG 24 hr tablet Take 100 mg by mouth daily. Take with or immediately following a meal.   Yes Historical Provider, MD  peginterferon alfa-2b (PEG-INTRON) 50 MCG/0.5ML injection Inject 50 mcg into the skin once a week.   Yes Historical Provider, MD  ribavirin (REBETOL)  200 MG capsule Take 600 mg by mouth 2 (two) times daily.   Yes Historical Provider, MD   No Known Allergies  History  Substance Use Topics  . Smoking status: Current Every Day Smoker -- 0.5 packs/day for 35 years  . Smokeless tobacco: Not on file  . Alcohol Use: Yes     occassionally    History reviewed. No pertinent family history.   Review of Systems  Positive ROS: neg  All other systems have been reviewed and were otherwise negative with the exception of those mentioned in the HPI and as above.  Objective: Vital signs in last 24 hours: Temp:  [98 F (36.7 C)-99.1 F (37.3 C)] 98 F (36.7 C) (10/30 0636) Pulse Rate:  [67-80] 67  (10/30 0636) Resp:  [18-20] 18  (10/30 0636) BP: (109-167)/(72-85) 167/85 mmHg (10/30 0636) SpO2:  [97 %-100 %] 100 % (10/30 0636) Weight:  [80.8 kg (178 lb 2.1 oz)] 80.8 kg (178 lb 2.1 oz) (10/29 1304)  General Appearance: Alert, cooperative, no distress, appears stated age Head: Normocephalic, without obvious abnormality, atraumatic Eyes: PERRL, conjunctiva/corneas clear      Back: Symmetric, no curvature, ROM normal, no CVA tenderness Lungs: Clear to auscultation bilaterally, respirations unlabored Heart: Regular rate and rhythm Abdomen: Soft, non-tender, bowel sounds  Extremities: Extremities normal, atraumatic, no cyanosis or edema Pulses: 2+ and symmetric  all extremities Skin: Skin color, texture, turgor normal, no rashes or lesions  NEUROLOGIC:   Mental status: Alert and oriented x4,  no aphasia, good attention span, fund of knowledge, and memory Motor Exam - grossly normal Sensory Exam - grossly normal Reflexes: 1+ Coordination - grossly normal Gait - grossly normal Balance - grossly normal Cranial Nerves: I: smell Not tested  II: visual acuity  OS: nl    OD: nl  II: visual fields Full to confrontation  II: pupils Equal, round, reactive to light  III,VII: ptosis None  III,IV,VI: extraocular muscles  Full ROM  V: mastication  Normal  V: facial light touch sensation  Normal  V,VII: corneal reflex  Present  VII: facial muscle function - upper  Normal  VII: facial muscle function - lower Normal  VIII: hearing Not tested  IX: soft palate elevation  Normal  IX,X: gag reflex Present  XI: trapezius strength  5/5  XI: sternocleidomastoid strength 5/5  XI: neck flexion strength  5/5  XII: tongue strength  Normal    Data Review Lab Results  Component Value Date   WBC 4.3 10/06/2012   HGB 14.9 10/06/2012   HCT 43.3 10/06/2012   MCV 99.1 10/06/2012   PLT 82* 10/06/2012   Lab Results  Component Value Date   NA 142 10/06/2012   K 4.9 10/06/2012   CL 101 10/06/2012   CO2 29 10/06/2012   BUN 18 10/06/2012   CREATININE 0.95 10/06/2012   GLUCOSE 93 10/06/2012   Lab Results  Component Value Date   INR 0.99 10/06/2012    Assessment/Plan: Patient admitted for DLL L2-3, L4-5 Left. Patient has failed conservative therapy.  I explained the condition and procedure to the patient and answered any questions.  Patient wishes to proceed with procedure as planned. Understands risks/ benefits and typical outcomes of procedure.   Kemiya Batdorf S 10/07/2012 8:19 AM

## 2012-10-08 ENCOUNTER — Encounter (HOSPITAL_COMMUNITY): Payer: Self-pay | Admitting: Neurological Surgery

## 2012-11-02 ENCOUNTER — Ambulatory Visit: Payer: Self-pay | Admitting: Neurological Surgery

## 2012-11-16 ENCOUNTER — Emergency Department: Payer: Self-pay | Admitting: Emergency Medicine

## 2012-11-18 ENCOUNTER — Other Ambulatory Visit: Payer: Self-pay | Admitting: Neurological Surgery

## 2012-11-18 DIAGNOSIS — M549 Dorsalgia, unspecified: Secondary | ICD-10-CM

## 2012-11-23 ENCOUNTER — Ambulatory Visit
Admission: RE | Admit: 2012-11-23 | Discharge: 2012-11-23 | Disposition: A | Discharge status: Home / Self Care | Payer: Medicare Other | Source: Ambulatory Visit | Attending: Neurological Surgery | Admitting: Neurological Surgery

## 2012-11-23 ENCOUNTER — Other Ambulatory Visit: Payer: Self-pay | Admitting: Neurological Surgery

## 2012-11-23 ENCOUNTER — Encounter (HOSPITAL_COMMUNITY): Payer: Self-pay | Admitting: Pharmacy Technician

## 2012-11-23 VITALS — BP 155/70 | HR 61

## 2012-11-23 DIAGNOSIS — M5126 Other intervertebral disc displacement, lumbar region: Secondary | ICD-10-CM

## 2012-11-23 DIAGNOSIS — M549 Dorsalgia, unspecified: Secondary | ICD-10-CM

## 2012-11-23 MED ORDER — IOHEXOL 180 MG/ML  SOLN
1.0000 mL | Freq: Once | INTRAMUSCULAR | Status: AC | PRN
  Administered 2012-11-23: 1 mL via EPIDURAL

## 2012-11-23 MED ORDER — METHYLPREDNISOLONE ACETATE 40 MG/ML INJ SUSP (RADIOLOG
120.0000 mg | Freq: Once | INTRAMUSCULAR | Status: AC
  Administered 2012-11-23: 120 mg via EPIDURAL

## 2012-11-25 ENCOUNTER — Encounter (HOSPITAL_COMMUNITY)
Admission: RE | Admit: 2012-11-25 | Discharge: 2012-11-25 | Disposition: A | Discharge status: Home / Self Care | Payer: Medicare Other | Source: Ambulatory Visit | Attending: Neurological Surgery | Admitting: Neurological Surgery

## 2012-11-25 ENCOUNTER — Encounter (HOSPITAL_COMMUNITY): Payer: Self-pay

## 2012-11-25 HISTORY — DX: Cyst of kidney, acquired: N28.1

## 2012-11-25 LAB — COMPREHENSIVE METABOLIC PANEL
ALT: 23 U/L (ref 0–53)
Alkaline Phosphatase: 114 U/L (ref 39–117)
BUN: 20 mg/dL (ref 6–23)
CO2: 26 mEq/L (ref 19–32)
Calcium: 9.2 mg/dL (ref 8.4–10.5)
GFR calc Af Amer: >90 mL/min (ref >90)
GFR calc non Af Amer: 89 mL/min — ABNORMAL LOW (ref >90)
Glucose, Bld: 102 mg/dL — ABNORMAL HIGH (ref 70–99)
Potassium: 4.3 mEq/L (ref 3.5–5.1)
Total Protein: 7.3 g/dL (ref 6.0–8.3)

## 2012-11-25 LAB — CBC WITH DIFFERENTIAL/PLATELET
Basophils Absolute: 0 10*3/uL (ref 0.0–0.1)
Basophils Relative: 0 % (ref 0–1)
HCT: 38.2 % — ABNORMAL LOW (ref 39.0–52.0)
Lymphocytes Relative: 22 % (ref 12–46)
MCHC: 33 g/dL (ref 30.0–36.0)
Monocytes Absolute: 1 10*3/uL (ref 0.1–1.0)
Neutro Abs: 2.4 10*3/uL (ref 1.7–7.7)
Neutrophils Relative %: 54 % (ref 43–77)
RDW: 13 % (ref 11.5–15.5)
WBC: 4.4 10*3/uL (ref 4.0–10.5)

## 2012-11-25 LAB — SURGICAL PCR SCREEN: Staphylococcus aureus: NEGATIVE

## 2012-11-25 LAB — ABO/RH: ABO/RH(D): O NEG

## 2012-11-25 LAB — PROTIME-INR
INR: 0.94 (ref 0.00–1.49)
Prothrombin Time: 12.5 seconds (ref 11.6–15.2)

## 2012-11-25 LAB — TYPE AND SCREEN: ABO/RH(D): O NEG

## 2012-11-25 MED ORDER — DEXAMETHASONE SODIUM PHOSPHATE 10 MG/ML IJ SOLN
10.0000 mg | INTRAMUSCULAR | Status: DC
  Filled 2012-11-25: qty 1

## 2012-11-25 MED ORDER — CEFAZOLIN SODIUM-DEXTROSE 2-3 GM-% IV SOLR
2.0000 g | INTRAVENOUS | Status: AC
  Administered 2012-11-26: 2 g via INTRAVENOUS
  Filled 2012-11-25: qty 50

## 2012-11-25 NOTE — Progress Notes (Signed)
Cxr, ekg 10/13 epic

## 2012-11-25 NOTE — Pre-Procedure Instructions (Addendum)
20 Herbert Chapman  11/25/2012   Your procedure is scheduled on:  11/26/12  Report to [facility] at 1400 pM.  Call this number if you have problems the morning of surgery: [phone]   Remember:   Do not eat food or drink:After Midnight. inst light breakfast per dr office early    Take these medicines the morning of surgery with A SIP OF WATER: prozac, pain med, pepcid, metoprolol,ribavarin   Do not wear jewelry,   Do not wear lotions, powders, or perfumes. You may not wear deodorant.  Do not shave 48 hours prior to surgery. Men may shave face and neck.  Do not bring valuables to the hospital.  Contacts, dentures or bridgework may not be worn into surgery.  Leave suitcase in the car. After surgery it may be brought to your room.  For patients admitted to the hospital, checkout time is 11:00 AM the day of discharge.   Patients discharged the day of surgery will not be allowed to drive home.  Name and phone number of your driver:partner [phone] ronald touvell  Special Instructions: Shower using CHG 2 nights before surgery and the night before surgery.  If you shower the day of surgery use CHG.  Use special wash - you have one bottle of CHG for all showers.  You should use approximately 1/3 of the bottle for each shower.   Please read over the following fact sheets that you were given: Pain Booklet, Coughing and Deep Breathing, Blood Transfusion Information, MRSA Information and Surgical Site Infection Prevention

## 2012-11-26 ENCOUNTER — Encounter (HOSPITAL_COMMUNITY): Payer: Self-pay | Admitting: Anesthesiology

## 2012-11-26 ENCOUNTER — Encounter (HOSPITAL_COMMUNITY): Payer: Self-pay | Admitting: Neurological Surgery

## 2012-11-26 ENCOUNTER — Encounter (HOSPITAL_COMMUNITY)
Admission: RE | Disposition: A | Discharge status: Home / Self Care | Payer: Self-pay | Source: Ambulatory Visit | Attending: Neurological Surgery

## 2012-11-26 ENCOUNTER — Inpatient Hospital Stay (HOSPITAL_COMMUNITY): Payer: Medicare Other | Admitting: Anesthesiology

## 2012-11-26 ENCOUNTER — Inpatient Hospital Stay (HOSPITAL_COMMUNITY): Payer: Medicare Other

## 2012-11-26 ENCOUNTER — Inpatient Hospital Stay (HOSPITAL_COMMUNITY)
Admission: RE | Admit: 2012-11-26 | Discharge: 2012-11-28 | DRG: 460 | Disposition: A | Discharge status: Home / Self Care | Payer: Medicare Other | Source: Ambulatory Visit | Attending: Neurological Surgery | Admitting: Neurological Surgery

## 2012-11-26 ENCOUNTER — Encounter (HOSPITAL_COMMUNITY): Payer: Self-pay | Admitting: *Deleted

## 2012-11-26 DIAGNOSIS — F17200 Nicotine dependence, unspecified, uncomplicated: Secondary | ICD-10-CM | POA: Diagnosis present

## 2012-11-26 DIAGNOSIS — I10 Essential (primary) hypertension: Secondary | ICD-10-CM | POA: Diagnosis present

## 2012-11-26 DIAGNOSIS — M4310 Spondylolisthesis, site unspecified: Secondary | ICD-10-CM | POA: Diagnosis present

## 2012-11-26 DIAGNOSIS — Z7982 Long term (current) use of aspirin: Secondary | ICD-10-CM

## 2012-11-26 DIAGNOSIS — K219 Gastro-esophageal reflux disease without esophagitis: Secondary | ICD-10-CM | POA: Diagnosis present

## 2012-11-26 DIAGNOSIS — Z79899 Other long term (current) drug therapy: Secondary | ICD-10-CM

## 2012-11-26 DIAGNOSIS — M4804 Spinal stenosis, thoracic region: Principal | ICD-10-CM | POA: Diagnosis present

## 2012-11-26 DIAGNOSIS — M216X9 Other acquired deformities of unspecified foot: Secondary | ICD-10-CM | POA: Diagnosis present

## 2012-11-26 DIAGNOSIS — E119 Type 2 diabetes mellitus without complications: Secondary | ICD-10-CM | POA: Diagnosis present

## 2012-11-26 DIAGNOSIS — B1920 Unspecified viral hepatitis C without hepatic coma: Secondary | ICD-10-CM | POA: Diagnosis present

## 2012-11-26 LAB — GLUCOSE, CAPILLARY: Glucose-Capillary: 89 mg/dL (ref 70–99)

## 2012-11-26 SURGERY — POSTERIOR LUMBAR FUSION 1 LEVEL
Anesthesia: General | Site: Back

## 2012-11-26 MED ORDER — LACTATED RINGERS IV SOLN
INTRAVENOUS | Status: DC | PRN
  Administered 2012-11-26 (×3): via INTRAVENOUS

## 2012-11-26 MED ORDER — ASPIRIN 81 MG PO TABS: 162.0000 mg | ORAL_TABLET | Freq: Every day | ORAL | Status: DC

## 2012-11-26 MED ORDER — FENTANYL CITRATE 0.05 MG/ML IJ SOLN
INTRAMUSCULAR | Status: DC | PRN
  Administered 2012-11-26: 150 ug via INTRAVENOUS
  Administered 2012-11-26: 100 ug via INTRAVENOUS

## 2012-11-26 MED ORDER — ACETAMINOPHEN 10 MG/ML IV SOLN
INTRAVENOUS | Status: AC
  Administered 2012-11-26: 1000 mg via INTRAVENOUS
  Filled 2012-11-26: qty 100

## 2012-11-26 MED ORDER — DEXAMETHASONE 4 MG PO TABS
4.0000 mg | ORAL_TABLET | Freq: Four times a day (QID) | ORAL | Status: DC
  Administered 2012-11-26 – 2012-11-27 (×4): 4 mg via ORAL
  Filled 2012-11-26 (×10): qty 1

## 2012-11-26 MED ORDER — ACETAMINOPHEN 650 MG RE SUPP: 650.0000 mg | RECTAL | Status: DC | PRN

## 2012-11-26 MED ORDER — MORPHINE SULFATE 2 MG/ML IJ SOLN
1.0000 mg | INTRAMUSCULAR | Status: DC | PRN
  Administered 2012-11-26 – 2012-11-28 (×4): 2 mg via INTRAVENOUS
  Filled 2012-11-26 (×4): qty 1

## 2012-11-26 MED ORDER — SODIUM CHLORIDE 0.9 % IJ SOLN: 3.0000 mL | INTRAMUSCULAR | Status: DC | PRN

## 2012-11-26 MED ORDER — SODIUM CHLORIDE 0.9 % IR SOLN
Status: DC | PRN
  Administered 2012-11-26: 17:00:00

## 2012-11-26 MED ORDER — DEXAMETHASONE SODIUM PHOSPHATE 4 MG/ML IJ SOLN
4.0000 mg | Freq: Four times a day (QID) | INTRAMUSCULAR | Status: DC
  Administered 2012-11-28 (×2): 4 mg via INTRAVENOUS
  Filled 2012-11-26 (×8): qty 1

## 2012-11-26 MED ORDER — OXYCODONE HCL 5 MG PO TABS
10.0000 mg | ORAL_TABLET | Freq: Four times a day (QID) | ORAL | Status: DC | PRN
  Administered 2012-11-26 – 2012-11-28 (×6): 10 mg via ORAL
  Filled 2012-11-26 (×7): qty 2

## 2012-11-26 MED ORDER — OXYCODONE HCL 5 MG PO TABS: 5.0000 mg | ORAL_TABLET | Freq: Once | ORAL | Status: DC | PRN

## 2012-11-26 MED ORDER — RIBAVIRIN 200 MG PO CAPS
600.0000 mg | ORAL_CAPSULE | Freq: Two times a day (BID) | ORAL | Status: DC
  Administered 2012-11-27: 600 mg via ORAL
  Filled 2012-11-26 (×6): qty 3

## 2012-11-26 MED ORDER — METFORMIN HCL 500 MG PO TABS
500.0000 mg | ORAL_TABLET | Freq: Two times a day (BID) | ORAL | Status: DC
  Administered 2012-11-27 – 2012-11-28 (×3): 500 mg via ORAL
  Filled 2012-11-26 (×5): qty 1

## 2012-11-26 MED ORDER — ARTIFICIAL TEARS OP OINT
TOPICAL_OINTMENT | OPHTHALMIC | Status: DC | PRN
  Administered 2012-11-26: 1 via OPHTHALMIC

## 2012-11-26 MED ORDER — POTASSIUM CHLORIDE IN NACL 20-0.9 MEQ/L-% IV SOLN
INTRAVENOUS | Status: DC
  Administered 2012-11-27 (×2): via INTRAVENOUS
  Filled 2012-11-26 (×6): qty 1000

## 2012-11-26 MED ORDER — METOPROLOL SUCCINATE ER 100 MG PO TB24
100.0000 mg | ORAL_TABLET | Freq: Every day | ORAL | Status: DC
  Administered 2012-11-27: 100 mg via ORAL
  Filled 2012-11-26 (×2): qty 1

## 2012-11-26 MED ORDER — LIDOCAINE HCL 4 % MT SOLN
OROMUCOSAL | Status: DC | PRN
  Administered 2012-11-26: 4 mL via TOPICAL

## 2012-11-26 MED ORDER — PROPOFOL 10 MG/ML IV BOLUS
INTRAVENOUS | Status: DC | PRN
  Administered 2012-11-26 (×2): 20 mg via INTRAVENOUS
  Administered 2012-11-26 (×3): 10 mg via INTRAVENOUS
  Administered 2012-11-26 (×2): 20 mg via INTRAVENOUS
  Administered 2012-11-26: 200 mg via INTRAVENOUS
  Administered 2012-11-26: 10 mg via INTRAVENOUS
  Administered 2012-11-26: 20 mg via INTRAVENOUS

## 2012-11-26 MED ORDER — CEFAZOLIN SODIUM 1-5 GM-% IV SOLN
1.0000 g | Freq: Three times a day (TID) | INTRAVENOUS | Status: AC
  Administered 2012-11-27 (×2): 1 g via INTRAVENOUS
  Filled 2012-11-26 (×3): qty 50

## 2012-11-26 MED ORDER — ONDANSETRON HCL 4 MG/2ML IJ SOLN: 4.0000 mg | INTRAMUSCULAR | Status: DC | PRN

## 2012-11-26 MED ORDER — HYDROMORPHONE HCL PF 1 MG/ML IJ SOLN
0.2500 mg | INTRAMUSCULAR | Status: DC | PRN
  Administered 2012-11-26 (×2): 0.5 mg via INTRAVENOUS

## 2012-11-26 MED ORDER — METHOCARBAMOL 100 MG/ML IJ SOLN
500.0000 mg | Freq: Four times a day (QID) | INTRAVENOUS | Status: DC | PRN
  Filled 2012-11-26: qty 5

## 2012-11-26 MED ORDER — LIDOCAINE HCL (CARDIAC) 20 MG/ML IV SOLN
INTRAVENOUS | Status: DC | PRN
  Administered 2012-11-26: 50 mg via INTRAVENOUS

## 2012-11-26 MED ORDER — PHENOL 1.4 % MT LIQD: 1.0000 | OROMUCOSAL | Status: DC | PRN

## 2012-11-26 MED ORDER — BUPIVACAINE HCL (PF) 0.25 % IJ SOLN
INTRAMUSCULAR | Status: DC | PRN
  Administered 2012-11-26: 10 mL

## 2012-11-26 MED ORDER — METOCLOPRAMIDE HCL 5 MG/ML IJ SOLN: 10.0000 mg | Freq: Once | INTRAMUSCULAR | Status: DC | PRN

## 2012-11-26 MED ORDER — BACITRACIN 50000 UNITS IM SOLR
INTRAMUSCULAR | Status: AC
  Filled 2012-11-26: qty 1

## 2012-11-26 MED ORDER — MENTHOL 3 MG MT LOZG
1.0000 | LOZENGE | OROMUCOSAL | Status: DC | PRN
  Administered 2012-11-27: 3 mg via ORAL
  Filled 2012-11-26: qty 9

## 2012-11-26 MED ORDER — THROMBIN 20000 UNITS EX SOLR
CUTANEOUS | Status: DC | PRN
  Administered 2012-11-26: 17:00:00 via TOPICAL

## 2012-11-26 MED ORDER — HYDROMORPHONE HCL PF 1 MG/ML IJ SOLN
INTRAMUSCULAR | Status: AC
  Filled 2012-11-26: qty 1

## 2012-11-26 MED ORDER — ACETAMINOPHEN 10 MG/ML IV SOLN
1000.0000 mg | Freq: Four times a day (QID) | INTRAVENOUS | Status: AC
  Administered 2012-11-27 (×4): 1000 mg via INTRAVENOUS
  Filled 2012-11-26 (×5): qty 100

## 2012-11-26 MED ORDER — CELECOXIB 200 MG PO CAPS
200.0000 mg | ORAL_CAPSULE | Freq: Two times a day (BID) | ORAL | Status: DC
  Administered 2012-11-27: 200 mg via ORAL
  Filled 2012-11-26 (×5): qty 1

## 2012-11-26 MED ORDER — FLUOXETINE HCL 20 MG PO CAPS
20.0000 mg | ORAL_CAPSULE | Freq: Three times a day (TID) | ORAL | Status: DC
  Administered 2012-11-27 (×3): 20 mg via ORAL
  Filled 2012-11-26 (×7): qty 1

## 2012-11-26 MED ORDER — ASPIRIN 81 MG PO CHEW
162.0000 mg | CHEWABLE_TABLET | Freq: Every day | ORAL | Status: DC
  Administered 2012-11-27: 162 mg via ORAL
  Filled 2012-11-26: qty 2
  Filled 2012-11-26 (×2): qty 1
  Filled 2012-11-26: qty 2

## 2012-11-26 MED ORDER — ONDANSETRON HCL 4 MG/2ML IJ SOLN
INTRAMUSCULAR | Status: DC | PRN
  Administered 2012-11-26: 4 mg via INTRAVENOUS

## 2012-11-26 MED ORDER — GLYCOPYRROLATE 0.2 MG/ML IJ SOLN
INTRAMUSCULAR | Status: DC | PRN
  Administered 2012-11-26: 0.2 mg via INTRAVENOUS

## 2012-11-26 MED ORDER — FAMOTIDINE 20 MG PO TABS
20.0000 mg | ORAL_TABLET | Freq: Every day | ORAL | Status: DC | PRN
Start: 2012-11-26 — End: 2012-11-28
  Filled 2012-11-26: qty 1

## 2012-11-26 MED ORDER — HYDROMORPHONE HCL PF 1 MG/ML IJ SOLN
INTRAMUSCULAR | Status: DC | PRN
  Administered 2012-11-26 (×2): 0.5 mg via INTRAVENOUS

## 2012-11-26 MED ORDER — PHENYLEPHRINE HCL 10 MG/ML IJ SOLN
10.0000 mg | INTRAVENOUS | Status: DC | PRN
  Administered 2012-11-26: 50 ug/min via INTRAVENOUS

## 2012-11-26 MED ORDER — LISINOPRIL 20 MG PO TABS
20.0000 mg | ORAL_TABLET | Freq: Every day | ORAL | Status: DC
  Filled 2012-11-26 (×2): qty 1

## 2012-11-26 MED ORDER — ACETAMINOPHEN 325 MG PO TABS: 650.0000 mg | ORAL_TABLET | ORAL | Status: DC | PRN

## 2012-11-26 MED ORDER — DEXAMETHASONE SODIUM PHOSPHATE 4 MG/ML IJ SOLN
INTRAMUSCULAR | Status: DC | PRN
  Administered 2012-11-26: 4 mg via INTRAVENOUS

## 2012-11-26 MED ORDER — 0.9 % SODIUM CHLORIDE (POUR BTL) OPTIME
TOPICAL | Status: DC | PRN
  Administered 2012-11-26: 1000 mL

## 2012-11-26 MED ORDER — INFLUENZA VIRUS VACC SPLIT PF IM SUSP
0.5000 mL | INTRAMUSCULAR | Status: AC
  Administered 2012-11-27: 0.5 mL via INTRAMUSCULAR
  Filled 2012-11-26: qty 0.5

## 2012-11-26 MED ORDER — OXYCODONE HCL 5 MG/5ML PO SOLN: 5.0000 mg | Freq: Once | ORAL | Status: DC | PRN

## 2012-11-26 MED ORDER — DEXAMETHASONE SODIUM PHOSPHATE 4 MG/ML IJ SOLN: INTRAMUSCULAR | Status: DC | PRN

## 2012-11-26 MED ORDER — SUCCINYLCHOLINE CHLORIDE 20 MG/ML IJ SOLN
INTRAMUSCULAR | Status: DC | PRN
  Administered 2012-11-26: 150 mg via INTRAVENOUS

## 2012-11-26 MED ORDER — METHOCARBAMOL 100 MG/ML IJ SOLN
500.0000 mg | INTRAVENOUS | Status: AC
  Administered 2012-11-26: 500 mg via INTRAVENOUS
  Filled 2012-11-26: qty 5

## 2012-11-26 MED ORDER — SODIUM CHLORIDE 0.9 % IJ SOLN
3.0000 mL | Freq: Two times a day (BID) | INTRAMUSCULAR | Status: DC
  Administered 2012-11-27: 3 mL via INTRAVENOUS

## 2012-11-26 MED ORDER — HYDROCHLOROTHIAZIDE 25 MG PO TABS
25.0000 mg | ORAL_TABLET | Freq: Every day | ORAL | Status: DC
  Filled 2012-11-26 (×2): qty 1

## 2012-11-26 MED ORDER — EPHEDRINE SULFATE 50 MG/ML IJ SOLN
INTRAMUSCULAR | Status: DC | PRN
  Administered 2012-11-26 (×3): 10 mg via INTRAVENOUS

## 2012-11-26 MED ORDER — SODIUM CHLORIDE 0.9 % IV SOLN
INTRAVENOUS | Status: AC
  Filled 2012-11-26: qty 500

## 2012-11-26 MED ORDER — SODIUM CHLORIDE 0.9 % IV SOLN
250.0000 mL | INTRAVENOUS | Status: DC
  Administered 2012-11-28: 250 mL via INTRAVENOUS

## 2012-11-26 MED ORDER — METHOCARBAMOL 500 MG PO TABS
500.0000 mg | ORAL_TABLET | Freq: Four times a day (QID) | ORAL | Status: DC | PRN
  Administered 2012-11-27 (×2): 500 mg via ORAL
  Filled 2012-11-26 (×2): qty 1

## 2012-11-26 SURGICAL SUPPLY — 61 items
BAG DECANTER FOR FLEXI CONT (MISCELLANEOUS) ×2 IMPLANT
BENZOIN TINCTURE PRP APPL 2/3 (GAUZE/BANDAGES/DRESSINGS) ×2 IMPLANT
BLADE SURG ROTATE 9660 (MISCELLANEOUS) ×2 IMPLANT
BONE MATRIX OSTEOCEL PLUS 5CC (Bone Implant) ×2 IMPLANT
BUR MATCHSTICK NEURO 3.0 LAGG (BURR) ×2 IMPLANT
CAGE COROENT PLIF 10X28-8 LUMB (Cage) ×4 IMPLANT
CANISTER SUCTION 2500CC (MISCELLANEOUS) ×2 IMPLANT
CLIP NEUROVISION LG (CLIP) ×2 IMPLANT
CLOTH BEACON ORANGE TIMEOUT ST (SAFETY) ×2 IMPLANT
CONT SPEC 4OZ CLIKSEAL STRL BL (MISCELLANEOUS) ×4 IMPLANT
COVER BACK TABLE 24X17X13 BIG (DRAPES) IMPLANT
COVER TABLE BACK 60X90 (DRAPES) ×2 IMPLANT
DRAPE C-ARM 42X72 X-RAY (DRAPES) ×2 IMPLANT
DRAPE C-ARMOR (DRAPES) ×2 IMPLANT
DRAPE LAPAROTOMY 100X72X124 (DRAPES) ×2 IMPLANT
DRAPE POUCH INSTRU U-SHP 10X18 (DRAPES) ×2 IMPLANT
DRAPE SURG 17X23 STRL (DRAPES) ×2 IMPLANT
DRESSING TELFA 8X3 (GAUZE/BANDAGES/DRESSINGS) ×2 IMPLANT
DRSG OPSITE 4X5.5 SM (GAUZE/BANDAGES/DRESSINGS) ×2 IMPLANT
DURAPREP 26ML APPLICATOR (WOUND CARE) ×2 IMPLANT
ELECT REM PT RETURN 9FT ADLT (ELECTROSURGICAL) ×2
ELECTRODE REM PT RTRN 9FT ADLT (ELECTROSURGICAL) ×1 IMPLANT
EVACUATOR 1/8 PVC DRAIN (DRAIN) ×2 IMPLANT
GAUZE SPONGE 4X4 16PLY XRAY LF (GAUZE/BANDAGES/DRESSINGS) IMPLANT
GLOVE BIO SURGEON STRL SZ 6.5 (GLOVE) ×6 IMPLANT
GLOVE BIO SURGEON STRL SZ8 (GLOVE) ×4 IMPLANT
GLOVE BIOGEL PI IND STRL 7.0 (GLOVE) ×1 IMPLANT
GLOVE BIOGEL PI IND STRL 8.5 (GLOVE) ×2 IMPLANT
GLOVE BIOGEL PI INDICATOR 7.0 (GLOVE) ×1
GLOVE BIOGEL PI INDICATOR 8.5 (GLOVE) ×2
GLOVE EXAM NITRILE MD LF STRL (GLOVE) ×2 IMPLANT
GOWN BRE IMP SLV AUR LG STRL (GOWN DISPOSABLE) ×2 IMPLANT
GOWN BRE IMP SLV AUR XL STRL (GOWN DISPOSABLE) ×4 IMPLANT
GOWN STRL REIN 2XL LVL4 (GOWN DISPOSABLE) IMPLANT
HEADS TULIP (Orthopedic Implant) ×4 IMPLANT
HEMOSTAT POWDER KIT SURGIFOAM (HEMOSTASIS) IMPLANT
KIT BASIN OR (CUSTOM PROCEDURE TRAY) ×2 IMPLANT
KIT ROOM TURNOVER OR (KITS) ×2 IMPLANT
LIGHT SOURCE ANGLE TIP STR 7FT (MISCELLANEOUS) ×2 IMPLANT
MILL MEDIUM DISP (BLADE) IMPLANT
NEEDLE HYPO 25X1 1.5 SAFETY (NEEDLE) ×2 IMPLANT
NS IRRIG 1000ML POUR BTL (IV SOLUTION) ×2 IMPLANT
PACK LAMINECTOMY NEURO (CUSTOM PROCEDURE TRAY) ×2 IMPLANT
PAD ARMBOARD 7.5X6 YLW CONV (MISCELLANEOUS) ×6 IMPLANT
ROD PRE-BENT 35MM (Rod) ×4 IMPLANT
SCREW 5.0X30MM (Screw) ×4 IMPLANT
SCREW LOCK 5.5MM ROD (Screw) ×8 IMPLANT
SCREW SHANK MASTLIS 5.5X30 (Screw) ×4 IMPLANT
SPONGE LAP 4X18 X RAY DECT (DISPOSABLE) IMPLANT
SPONGE SURGIFOAM ABS GEL 100 (HEMOSTASIS) ×2 IMPLANT
STRIP CLOSURE SKIN 1/2X4 (GAUZE/BANDAGES/DRESSINGS) ×2 IMPLANT
SUT VIC AB 0 CT1 18XCR BRD8 (SUTURE) ×2 IMPLANT
SUT VIC AB 0 CT1 8-18 (SUTURE) ×2
SUT VIC AB 2-0 CP2 18 (SUTURE) ×4 IMPLANT
SUT VIC AB 3-0 SH 8-18 (SUTURE) ×4 IMPLANT
SYR 20ML ECCENTRIC (SYRINGE) ×2 IMPLANT
TOWEL OR 17X24 6PK STRL BLUE (TOWEL DISPOSABLE) ×2 IMPLANT
TOWEL OR 17X26 10 PK STRL BLUE (TOWEL DISPOSABLE) ×2 IMPLANT
TRAP SPECIMEN MUCOUS 40CC (MISCELLANEOUS) ×2 IMPLANT
TRAY FOLEY CATH 14FRSI W/METER (CATHETERS) ×2 IMPLANT
WATER STERILE IRR 1000ML POUR (IV SOLUTION) ×2 IMPLANT

## 2012-11-26 NOTE — Op Note (Signed)
11/26/2012  7:23 PM  PATIENT:  Herbert Chapman  66 y.o. 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 Decompressive lumbar laminectomy (Gill-type) L4-5 requiring more work than would be required of the typical PLIF procedure in order to adequately decompress the neural elements.  2. Posterior lumbar interbody fusion L4-5 using PEEK interbody cages packed with morcellized allograft and autograft   3. Posterior fixation L4-5 using cortical pedicle screws.    SURGEON:  Marikay Alar, MD  ASSISTANTS: Dr. Franky Macho  ANESTHESIA:  General  EBL: 200 ml     BLOOD ADMINISTERED:none  DRAINS: Hemovac   INDICATION FOR PROCEDURE: This patient underwent a left-sided hemilaminectomy for spinal stenosis at L4-5 about 7 weeks ago because he had left leg pain. Soon after surgery he developed right leg pain. MRI showed significant spinal stenosis L4-5 on the right. He then presented with progressive footdrop which started about a week ago, and I recommended urgent decompression and instrumented fusion at L4-5.  Patient understood the risks, benefits, and alternatives and potential outcomes and wished to proceed.  PROCEDURE DETAILS:  The patient was brought to the operating room. After induction of generalized endotracheal anesthesia the patient was rolled into the prone position on chest rolls and all pressure points were padded. The patient's lumbar region was cleaned and then prepped with DuraPrep and draped in the usual sterile fashion. Anesthesia was injected and then a dorsal midline incision was made and carried down to the lumbosacral fascia. The fascia was opened and the paraspinous musculature was taken down in a subperiosteal fashion to expose L4-5. Intraoperative fluoroscopy confirmed my level, and I started by placing my cortical pedicle screws at L4 bilaterally. AP fluoroscopy was used  to identify the screw entry is to 3 mm medial to the pars. I drilled in an upward and outward direction utilizing EMG monitoring. We then tapped and then placed 50 by 30 mm pedicle screws in a medial to lateral projection at L4 bilaterally. I then turned my attention to the interbody fusion and the spinous process was removed and complete lumbar laminectomies, hemi- facetectomies, and foraminotomies were performed at L4-5 bilaterally. The yellow ligament was removed to expose the underlying dura and nerve roots, and generous foraminotomies were performed to adequately decompress the neural elements. Once the decompression was complete, I turned my attention to the posterior lower lumbar interbody fusion. The epidural venous vasculature was coagulated and cut sharply. Disc space was incised and the initial discectomy was performed with pituitary rongeurs. The disc space was distracted with sequential distractors to a height of 9 mm. We then used a series of scrapers and shavers to prepare the endplates for fusion. The midline was prepared with Epstein curettes. Once the complete discectomy was finished, we packed an appropriate sized peek interbody cage with local autograft and morcellized allograft, gently retracted the nerve root, and tapped the cage into position at L4-5 bilaterally. . The midline was packed with morselized autograft and allograft. We then turned our attention to the posterior fixation at L5. The pedicle screw entry zones were identified utilizing surface landmarks and fluoroscopy. We probed each pedicle with the pedicle probe and tapped each pedicle with the appropriate tap. We palpated with a ball probe to assure no break in the cortex. We then placed 5 5 x 30 mm pedicle screws into the pedicles bilaterally at L5.  We then placed lordotic rods into  the multiaxial screw heads of the pedicle screws and locked these in position with the locking caps and anti-torque device. We then checked our  construct with AP and lateral fluoroscopy. Irrigated with copious amounts of bacitracin-containing saline solution. Placed a medium Hemovac drain through separate stab incision. Inspected the nerve roots once again to assure adequate decompression, lined to the dura with Gelfoam, and closed the muscle and the fascia with 0 Vicryl. Closed the subcutaneous tissues with 2-0 Vicryl and subcuticular tissues with 3-0 Vicryl. The skin was closed with benzoin and Steri-Strips. Dressing was then applied, the patient was awakened from general anesthesia and transported to the recovery room in stable condition. At the end of the procedure all sponge, needle and instrument counts were correct.   PLAN OF CARE: Admit to inpatient   PATIENT DISPOSITION:  PACU - hemodynamically stable.   Delay start of Pharmacological VTE agent (>24hrs) due to surgical blood loss or risk of bleeding:  yes

## 2012-11-26 NOTE — H&P (Signed)
Subjective: Patient is a 66 y.o. male admitted for PLIF L4-5. He underwent a left-sided hemilaminectomy about 6 or 7 weeks ago but developed severe right leg pain just after surgery. Onset of symptoms was several weeks ago, rapidly worsening since that time.  The pain is rated intense, and is located at the across the lower back and radiates to right lower extremity. Over the last week he has developed a progressive right foot drop . The pain is described as aching and sharp and occurs all day. The symptoms have been progressive. Symptoms are exacerbated by exercise and standing. MRI or CT showed severe right lateral recess stenosis L4-5 with spondylolisthesis.   Past Medical History  Diagnosis Date  . Hypertension   . Anxiety   . GERD (gastroesophageal reflux disease)   . Diabetes mellitus without complication   . Mental disorder   . Depression   . Pancreatitis 2012  . Cancer     squamous cell CA removed from skin  . Arthritis   . Hepatitis     Hep C  . Renal cysts, acquired, bilateral     Past Surgical History  Procedure Date  . Cervical spine surgery   . Tonsillectomy   . Eye surgery     bilateral cataract removal; implants  . Colonoscopy   . Lumbar laminectomy/decompression microdiscectomy 10/07/2012    Procedure: LUMBAR LAMINECTOMY/DECOMPRESSION MICRODISCECTOMY 2 LEVELS;  Surgeon: Tia Alert, MD;  Location: [facility];  Service: Neurosurgery;  Laterality: Left;  Left Lumbar two-three,lumbar four-five hemilaminectomy    Prior to Admission medications   Medication Sig Start Date End Date Taking? Authorizing Provider  aspirin 81 MG tablet Take 162 mg by mouth daily.   Yes Historical Provider, MD  famotidine (PEPCID) 20 MG tablet Take 20 mg by mouth daily as needed. For reflux   Yes Historical Provider, MD  FLUoxetine (PROZAC) 20 MG capsule Take 20 mg by mouth 3 (three) times daily.    Yes Historical Provider, MD  hydrochlorothiazide (HYDRODIURIL) 25 MG tablet Take 25 mg by  mouth daily.   Yes Historical Provider, MD  HYDROcodone-acetaminophen (NORCO/VICODIN) 5-325 MG per tablet Take 1-2 tablets by mouth every 6 (six) hours as needed for pain. 10/07/12  Yes Tia Alert, MD  lisinopril (PRINIVIL,ZESTRIL) 20 MG tablet Take 20 mg by mouth daily.   Yes Historical Provider, MD  metFORMIN (GLUCOPHAGE) 500 MG tablet Take 500 mg by mouth 2 (two) times daily with a meal.   Yes Historical Provider, MD  metoprolol succinate (TOPROL-XL) 100 MG 24 hr tablet Take 100 mg by mouth daily. Take with or immediately following a meal.   Yes Historical Provider, MD  peginterferon alfa-2b (PEG-INTRON) 50 MCG/0.5ML injection Inject 50 mcg into the skin once a week.   Yes Historical Provider, MD  ribavirin (REBETOL) 200 MG capsule Take 600 mg by mouth 2 (two) times daily.   Yes Historical Provider, MD   No Known Allergies  History  Substance Use Topics  . Smoking status: Current Every Day Smoker -- 0.5 packs/day for 35 years    Types: Cigarettes  . Smokeless tobacco: Not on file  . Alcohol Use: Yes     Comment: occassionally    History reviewed. No pertinent family history.   Review of Systems  Positive ROS: neg  All other systems have been reviewed and were otherwise negative with the exception of those mentioned in the HPI and as above.  Objective: Vital signs in last 24 hours: Temp:  [97.8  F (36.6 C)] 97.8 F (36.6 C) (12/19 1414) Pulse Rate:  [58] 58  (12/19 1414) Resp:  [16] 16  (12/19 1414) BP: (145)/(80) 145/80 mmHg (12/19 1414) SpO2:  [98 %] 98 % (12/19 1414)  General Appearance: Alert, cooperative, no distress, appears stated age Head: Normocephalic, without obvious abnormality, atraumatic Eyes: PERRL, conjunctiva/corneas clear, EOM's intact, fundi benign, both eyes      Ears: Normal TM's and external ear canals, both ears Throat: Lips, mucosa, and tongue normal; teeth and gums normal Neck: Supple, symmetrical, trachea midline, no adenopathy; thyroid: No  enlargement/tenderness/nodules; no carotid bruit or JVD Back: Symmetric, no curvature, ROM normal, no CVA tenderness Lungs: Clear to auscultation bilaterally, respirations unlabored Heart: Regular rate and rhythm, S1 and S2 normal, no murmur, rub or gallop Abdomen: Soft, non-tender, bowel sounds active all four quadrants, no masses, no organomegaly Extremities: Extremities normal, atraumatic, no cyanosis or edema Pulses: 2+ and symmetric all extremities Skin: Skin color, texture, turgor normal, no rashes or lesions  NEUROLOGIC:   Mental status: Alert and oriented x4,  no aphasia, good attention span, fund of knowledge, and memory Motor Exam - grossly normal except for Right footdrop Sensory Exam - grossly normal Reflexes: 1+ Coordination - grossly normal Gait - grossly normal Balance - grossly normal Cranial Nerves: I: smell Not tested  II: visual acuity  OS: nl    OD: nl  II: visual fields Full to confrontation  II: pupils Equal, round, reactive to light  III,VII: ptosis None  III,IV,VI: extraocular muscles  Full ROM  V: mastication Normal  V: facial light touch sensation  Normal  V,VII: corneal reflex  Present  VII: facial muscle function - upper  Normal  VII: facial muscle function - lower Normal  VIII: hearing Not tested  IX: soft palate elevation  Normal  IX,X: gag reflex Present  XI: trapezius strength  5/5  XI: sternocleidomastoid strength 5/5  XI: neck flexion strength  5/5  XII: tongue strength  Normal    Data Review Lab Results  Component Value Date   WBC 4.4 11/25/2012   HGB 12.6* 11/25/2012   HCT 38.2* 11/25/2012   MCV 95.7 11/25/2012   PLT 123* 11/25/2012   Lab Results  Component Value Date   NA 138 11/25/2012   K 4.3 11/25/2012   CL 101 11/25/2012   CO2 26 11/25/2012   BUN 20 11/25/2012   CREATININE 0.84 11/25/2012   GLUCOSE 102* 11/25/2012   Lab Results  Component Value Date   INR 0.94 11/25/2012    Assessment/Plan: Patient admitted for  PLIF L4-5. Patient has failed conservative therapy.  I explained the condition and procedure to the patient and answered any questions.  Patient wishes to proceed with procedure as planned. Understands risks/ benefits and typical outcomes of procedure.   Kristofer Schaffert S 11/26/2012 3:56 PM

## 2012-11-26 NOTE — Anesthesia Preprocedure Evaluation (Addendum)
Anesthesia Evaluation  Patient identified by MRN, date of birth, ID band Patient awake    Reviewed: Allergy & Precautions, H&P , NPO status , Patient's Chart, lab work & pertinent test results, reviewed documented beta blocker date and time   Airway Mallampati: I TM Distance: >3 FB Neck ROM: Full    Dental  (+) Teeth Intact and Dental Advisory Given   Pulmonary  breath sounds clear to auscultation        Cardiovascular hypertension, Pt. on medications Rhythm:Regular     Neuro/Psych Anxiety Depression    GI/Hepatic GERD-  Medicated,(+) Hepatitis -, C  Endo/Other  diabetes, Well Controlled  Renal/GU Renal disease     Musculoskeletal   Abdominal (+)  Abdomen: soft. Bowel sounds: normal.  Peds  Hematology   Anesthesia Other Findings   Reproductive/Obstetrics                         Anesthesia Physical Anesthesia Plan  ASA: III  Anesthesia Plan:    Post-op Pain Management:    Induction:   Airway Management Planned:   Additional Equipment:   Intra-op Plan:   Post-operative Plan:   Informed Consent:   Plan Discussed with:   Anesthesia Plan Comments:         Anesthesia Quick Evaluation

## 2012-11-26 NOTE — OR Nursing (Signed)
Neuromonitoring needles placed by B. Clinton, Charity fundraiser with guidance from Brigitte Pulse, [place] [employer] Rep

## 2012-11-26 NOTE — Transfer of Care (Signed)
Immediate Anesthesia Transfer of Care Note  Patient: Herbert Chapman  Procedure(s) Performed: Procedure(s) (LRB) with comments: POSTERIOR LUMBAR FUSION 1 LEVEL (N/A) - Poserior Lumbar Four-Five Interbody and Fusion  Patient Location: PACU  Anesthesia Type:General  Level of Consciousness: awake, alert , oriented and patient cooperative  Airway & Oxygen Therapy: Patient Spontanous Breathing and Patient connected to nasal cannula oxygen  Post-op Assessment: Report given to PACU RN, Post -op Vital signs reviewed and stable and Patient moving all extremities X 4  Post vital signs: Reviewed and stable  Complications: No apparent anesthesia complications

## 2012-11-26 NOTE — Anesthesia Postprocedure Evaluation (Signed)
Anesthesia Post Note  Patient: Herbert Chapman  Procedure(s) Performed: Procedure(s) (LRB): POSTERIOR LUMBAR FUSION 1 LEVEL (N/A)  Anesthesia type: general  Patient location: PACU  Post pain: Pain level controlled  Post assessment: Patient's Cardiovascular Status Stable  Last Vitals:  Filed Vitals:   11/26/12 2015  BP: 150/76  Pulse: 69  Temp:   Resp: 20    Post vital signs: Reviewed and stable  Level of consciousness: sedated  Complications: No apparent anesthesia complications

## 2012-11-26 NOTE — Anesthesia Procedure Notes (Signed)
Procedure Name: Intubation Date/Time: 11/26/2012 4:32 PM Performed by: Ellin Goodie Pre-anesthesia Checklist: Patient identified, Emergency Drugs available, Suction available, Patient being monitored and Timeout performed Patient Re-evaluated:Patient Re-evaluated prior to inductionOxygen Delivery Method: Circle system utilized Preoxygenation: Pre-oxygenation with 100% oxygen Intubation Type: IV induction, Cricoid Pressure applied and Rapid sequence Ventilation: Mask ventilation without difficulty Laryngoscope Size: Mac and 4 Grade View: Grade I Tube type: Oral Tube size: 7.5 mm Number of attempts: 1 Airway Equipment and Method: Stylet Placement Confirmation: ETT inserted through vocal cords under direct vision,  positive ETCO2 and breath sounds checked- equal and bilateral Secured at: 22 cm Tube secured with: Tape Dental Injury: Teeth and Oropharynx as per pre-operative assessment

## 2012-11-27 LAB — GLUCOSE, CAPILLARY: Glucose-Capillary: 210 mg/dL — ABNORMAL HIGH (ref 70–99)

## 2012-11-27 NOTE — Progress Notes (Signed)
Utilization review completed.  

## 2012-11-27 NOTE — Progress Notes (Signed)
Patient ID: Herbert Chapman, male   DOB: 06/28/1946, 66 y.o.   MRN: [id number] Doing well postoperatively. Right leg pain is gone. Right foot drop is stable compared to preop. He looks comfortable and has good strength otherwise. Up today with therapy. Likely home tomorrow.

## 2012-11-27 NOTE — Plan of Care (Signed)
Problem: Consults Goal: Diagnosis - Spinal Surgery Outcome: Completed/Met Date Met:  11/27/12 Thoraco/Lumbar Spine Fusion

## 2012-11-27 NOTE — Evaluation (Signed)
Occupational Therapy Evaluation Patient Details Name: Herbert Chapman MRN: [id number] DOB: [birth year] Today's Date: 11/27/2012 Time: 2952-8413 OT Time Calculation (min): 18 min  OT Assessment / Plan / Recommendation Clinical Impression  This 66 yo  male s/p back fusion presents to acute OT with problems below. Will benefit from acute OT without need for follow up.    OT Assessment  Patient needs continued OT Services    Follow Up Recommendations  No OT follow up    Barriers to Discharge None    Equipment Recommendations  None recommended by OT       Frequency  Min 2X/week    Precautions / Restrictions Precautions Precautions: Back Precaution Booklet Issued: Yes (comment) Precaution Comments: pt educated on 3/3 back precautions Required Braces or Orthoses: Spinal Brace Spinal Brace: Lumbar corset;Applied in sitting position Restrictions Weight Bearing Restrictions: No   Pertinent Vitals/Pain Back, Rn made aware, repositioned    ADL  Eating/Feeding: Simulated;Independent Where Assessed - Eating/Feeding: Edge of bed Grooming: Simulated;Set up Where Assessed - Grooming: Unsupported sitting Upper Body Bathing: Simulated;Set up Where Assessed - Upper Body Bathing: Unsupported sitting Lower Body Bathing: Simulated;Minimal assistance Where Assessed - Lower Body Bathing: Unsupported sit to stand Upper Body Dressing: Simulated;Set up Where Assessed - Upper Body Dressing: Unsupported sitting Lower Body Dressing: Simulated;Moderate assistance (without AE) Where Assessed - Lower Body Dressing: Unsupported sit to stand Toilet Transfer: Simulated;Min guard Toilet Transfer Method: Sit to Barista:  (Bed around to other side of bed) Toileting - Clothing Manipulation and Hygiene: Simulated;Min guard Where Assessed - Glass blower/designer Manipulation and Hygiene: Standing Equipment Used: Back brace;Rolling walker Transfers/Ambulation Related to ADLs: Min guard  A ADL Comments: Can easily cross one leg (left) over the other to get to foot, but not the other leg (right)    OT Diagnosis: Generalized weakness;Acute pain  OT Problem List: Decreased strength;Decreased range of motion;Pain;Decreased knowledge of use of DME or AE;Decreased knowledge of precautions OT Treatment Interventions: Self-care/ADL training;DME and/or AE instruction;Patient/family education;Balance training   OT Goals Acute Rehab OT Goals OT Goal Formulation: With patient Time For Goal Achievement: 12/04/12 Potential to Achieve Goals: Good ADL Goals Pt Will Perform Lower Body Dressing: with set-up;with supervision;Sit to stand from chair;Sit to stand from bed;Unsupported;with adaptive equipment ADL Goal: Lower Body Dressing - Progress: Goal set today Pt Will Transfer to Toilet: with modified independence;Ambulation;with DME;Comfort height toilet;Grab bars ADL Goal: Toilet Transfer - Progress: Goal set today Pt Will Perform Toileting - Clothing Manipulation: with modified independence;Standing ADL Goal: Toileting - Clothing Manipulation - Progress: Goal set today Pt Will Perform Toileting - Hygiene: with modified independence;Sit to stand from 3-in-1/toilet ADL Goal: Toileting - Hygiene - Progress: Goal set today Pt Will Perform Tub/Shower Transfer: with supervision;Ambulation;with DME;Shower seat with back ADL Goal: Web designer - Progress: Goal set today Miscellaneous OT Goals Miscellaneous OT Goal #1: Pt will be Mod I in and OOB for BADLs. OT Goal: Miscellaneous Goal #1 - Progress: Goal set today Miscellaneous OT Goal #2: Pt will be able to state and follow back precautions independently OT Goal: Miscellaneous Goal #2 - Progress: Goal set today Miscellaneous OT Goal #3: Pt will be able to donn brace independently OT Goal: Miscellaneous Goal #3 - Progress: Goal set today  Visit Information  Last OT Received On: 11/27/12 Assistance Needed: +1 PT/OT  Co-Evaluation/Treatment: Yes (partial)    Subjective Data  Subjective: I don't think I will need the walker once I get the foot brace Patient Stated Goal:  Go home tomorrow   Prior Functioning     Home Living Lives With: Significant other Available Help at Discharge: Family (works during the day, home during the weekend) Type of Home: Mobile home Home Access: Stairs to enter Secretary/administrator of Steps: 7 Entrance Stairs-Rails: Right;Left;Can reach both Home Layout: One level Bathroom Shower/Tub: Forensic scientist: Standard Bathroom Accessibility: Yes How Accessible: Accessible via walker Home Adaptive Equipment: None Prior Function Level of Independence: Independent Able to Take Stairs?: Yes Driving: Yes Vocation: Part time employment 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) Communication Communication: No difficulties Dominant Hand: Right            Cognition  Overall Cognitive Status: Appears within functional limits for tasks assessed/performed Arousal/Alertness: Awake/alert Orientation Level: Appears intact for tasks assessed Behavior During Session: [facility] for tasks performed    Extremity/Trunk Assessment Right Upper Extremity Assessment RUE ROM/Strength/Tone: Within functional levels Left Upper Extremity Assessment LUE ROM/Strength/Tone: Within functional levels     Mobility Bed Mobility Bed Mobility: Rolling Right;Right Sidelying to Sit;Sitting - Scoot to Edge of Bed Rolling Right: 5: Supervision;With rail Right Sidelying to Sit: 5: Supervision;HOB flat;With rails Sitting - Scoot to Edge of Bed: 5: Supervision Transfers Transfers: Sit to Stand;Stand to Sit Sit to Stand: 5: Supervision;With upper extremity assist;From bed Stand to Sit: 5: Supervision;With upper extremity assist;To bed Details for Transfer Assistance: VCs for safe hand placement              End of Session OT - End of  Session Equipment Utilized During Treatment: Back brace Activity Tolerance: Patient tolerated treatment well Patient left: in chair;with call bell/phone within reach Nurse Communication:  (Nurse in room when pt was ambulating with +1 A (min))       Evette Georges [phone] 11/27/2012, 9:24 AM

## 2012-11-27 NOTE — Progress Notes (Signed)
Patient foley removed at 0600. Fluids encouraged. Patient then ambulated around his room with moderate pain. Ambulated well.

## 2012-11-27 NOTE — Evaluation (Signed)
Physical Therapy Evaluation Patient Details Name: Herbert Chapman MRN: [id number] DOB: 1946-03-07 Today's Date: 11/27/2012 Time:  -     PT Assessment / Plan / Recommendation Clinical Impression  Pt s/p PLF L4-5. Pt with significant R foot drop in which he is able to compensate during ambulation. Awaiting R AFO prior to discharge. Pt will benefit from skilled PT in the acute care setting in order to maximize functional mobility, strength, and safety prior to d/c home    PT Assessment  Patient needs continued PT services    Follow Up Recommendations  No PT follow up;Supervision for mobility/OOB    Does the patient have the potential to tolerate intense rehabilitation      Barriers to Discharge        Equipment Recommendations  Rolling walker with 5" wheels    Recommendations for Other Services     Frequency Min 5X/week    Precautions / Restrictions Precautions Precautions: Back Precaution Booklet Issued: Yes (comment) Precaution Comments: pt educated on 3/3 back precautions Required Braces or Orthoses: Spinal Brace Spinal Brace: Lumbar corset;Applied in sitting position Restrictions Weight Bearing Restrictions: No   Pertinent Vitals/Pain Pain 7/10. RN present and meds given      Mobility  Bed Mobility Bed Mobility: Rolling Right;Right Sidelying to Sit;Sitting - Scoot to Edge of Bed Rolling Right: 5: Supervision;With rail Right Sidelying to Sit: 5: Supervision;HOB flat;With rails Sitting - Scoot to Edge of Bed: 5: Supervision Details for Bed Mobility Assistance: Cues for proper sequencing to maintain back precautions Transfers Transfers: Sit to Stand;Stand to Sit Sit to Stand: 5: Supervision;With upper extremity assist;From bed Stand to Sit: 5: Supervision;With upper extremity assist;To bed Details for Transfer Assistance: VCs for safe hand placement Ambulation/Gait Ambulation/Gait Assistance: 4: Min guard Ambulation Distance (Feet): 100 Feet Assistive device: Rolling  walker Ambulation/Gait Assistance Details: VC for safe distance to RW to avoid bending forward. Pt with R foot drop in which he was able to compensate with R hip hike. Cues for proper technique Gait Pattern: Step-to pattern;Decreased dorsiflexion - right;Right hip hike;Trunk flexed Gait velocity: slow gait speed    Shoulder Instructions     Exercises     PT Diagnosis: Acute pain;Difficulty walking  PT Problem List: Decreased strength;Decreased activity tolerance;Decreased balance;Decreased mobility;Decreased range of motion;Decreased knowledge of use of DME;Decreased safety awareness;Decreased knowledge of precautions;Pain PT Treatment Interventions: Gait training;DME instruction;Stair training;Functional mobility training;Therapeutic activities;Patient/family education   PT Goals Acute Rehab PT Goals PT Goal Formulation: With patient Time For Goal Achievement: 12/04/12 Potential to Achieve Goals: Good Pt will go Supine/Side to Sit: with modified independence PT Goal: Supine/Side to Sit - Progress: Goal set today Pt will go Sit to Supine/Side: with modified independence PT Goal: Sit to Supine/Side - Progress: Goal set today Pt will go Sit to Stand: with modified independence PT Goal: Sit to Stand - Progress: Goal set today Pt will go Stand to Sit: with modified independence PT Goal: Stand to Sit - Progress: Goal set today Pt will Transfer Bed to Chair/Chair to Bed: with modified independence PT Transfer Goal: Bed to Chair/Chair to Bed - Progress: Goal set today Pt will Ambulate: >150 feet;with modified independence;with least restrictive assistive device PT Goal: Ambulate - Progress: Goal set today Pt will Go Up / Down Stairs: 6-9 stairs;with supervision;with rail(s) PT Goal: Up/Down Stairs - Progress: Goal set today  Visit Information  Last PT Received On: 11/27/12 Assistance Needed: +1    Subjective Data  Patient Stated Goal: to go home  Prior Functioning  Home  Living Lives With: Significant other Available Help at Discharge: Family (works during the day, home during the weekend) Type of Home: Mobile home Home Access: Stairs to enter Secretary/administrator of Steps: 7 Entrance Stairs-Rails: Right;Left;Can reach both Home Layout: One level Bathroom Shower/Tub: Forensic scientist: Standard Bathroom Accessibility: Yes How Accessible: Accessible via walker Home Adaptive Equipment: None Prior Function Level of Independence: Independent Able to Take Stairs?: Yes Driving: Yes Vocation: Part time employment 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) Communication Communication: No difficulties Dominant Hand: Right    Cognition  Overall Cognitive Status: Appears within functional limits for tasks assessed/performed Arousal/Alertness: Awake/alert Orientation Level: Appears intact for tasks assessed Behavior During Session: [facility] for tasks performed    Extremity/Trunk Assessment Right Upper Extremity Assessment RUE ROM/Strength/Tone: Within functional levels Left Upper Extremity Assessment LUE ROM/Strength/Tone: Within functional levels Right Lower Extremity Assessment RLE ROM/Strength/Tone: Deficits RLE ROM/Strength/Tone Deficits: Hip and Knee WFL. PF 4/5. DF 2/5 RLE Sensation: WFL - Light Touch Left Lower Extremity Assessment LLE ROM/Strength/Tone: Within functional levels LLE Sensation: WFL - Light Touch   Balance    End of Session PT - End of Session Equipment Utilized During Treatment: Gait belt;Back brace (awaiting AFo) Activity Tolerance: Patient tolerated treatment well Patient left: in chair;with call bell/phone within reach Nurse Communication: Mobility status  GP     Milana Kidney 11/27/2012, 9:39 AM  11/27/2012 Milana Kidney DPT PAGER: [phone] OFFICE: [phone]

## 2012-11-28 NOTE — Progress Notes (Signed)
Pt discharged to home. Dc instructions given. No concerns voiced. Male family member at bedside at time of DC. Voiced understanding of DC instructions. Left unit in wheelchair pushed by nurse tech, accompanied by male family member. Left in good condition. Vwilliams,rn.

## 2012-11-28 NOTE — Plan of Care (Signed)
Problem: Discharge Progression Outcomes Goal: Ambulates without assistance Outcome: Completed/Met Date Met:  11/28/12 Standby asst

## 2012-11-28 NOTE — Discharge Summary (Signed)
Physician Discharge Summary  Patient ID: Herbert Chapman MRN: [id number] DOB/AGE: 67-Oct-1947 67 y.o.  Admit date: 11/26/2012 Discharge date: 11/28/2012  Admission Diagnoses:lumbar radiculopathy, right foot drop Lumbar spondylosis  Discharge Diagnoses: lumbar radiculopathy, right foot drop Lumbar spondylosis  Active Problems:  * No active hospital problems. *    Discharged Condition: good  Hospital Course: Mr. [name] was admitted to the hospital for right lower extremity weakness. He had a lumbar decompression and fusion which went well. Post operatively he has voided, walked, and eaten. He continues with a foot drop. Wound is clean, dry, and without signs of infection.   Consults: None  Significant Diagnostic Studies: none  Treatments: surgery: 1. Decompressive lumbar laminectomy (Gill-type) L4-5 requiring more work than would be required of the typical PLIF procedure in order to adequately decompress the neural elements.  2. Posterior lumbar interbody fusion L4-5 using PEEK interbody cages packed with morcellized allograft and autograft  3. Posterior fixation L4-5 using cortical pedicle screws.    Discharge Exam: Blood pressure 152/67, pulse 58, temperature 98.1 F (36.7 C), temperature source Oral, resp. rate 18, height 5\' 7"  (1.702 m), weight 78.926 kg (174 lb), SpO2 98.00%. General appearance: alert, cooperative and appears stated age Neurologic: Mental status: Alert, oriented, thought content appropriate Cranial nerves: normal Motor: no new deficits, continues with foot drop on right.   Disposition: 01-Home or Self Care     Medication List     As of 11/28/2012 11:19 AM    TAKE these medications         aspirin 81 MG tablet   Take 162 mg by mouth daily.      famotidine 20 MG tablet   Commonly known as: PEPCID   Take 20 mg by mouth daily as needed. For reflux      FLUoxetine 20 MG capsule   Commonly known as: PROZAC   Take 20 mg by mouth 3 (three) times daily.       hydrochlorothiazide 25 MG tablet   Commonly known as: HYDRODIURIL   Take 25 mg by mouth daily.      HYDROcodone-acetaminophen 5-325 MG per tablet   Commonly known as: NORCO/VICODIN   Take 1-2 tablets by mouth every 6 (six) hours as needed for pain.      lisinopril 20 MG tablet   Commonly known as: PRINIVIL,ZESTRIL   Take 20 mg by mouth daily.      metFORMIN 500 MG tablet   Commonly known as: GLUCOPHAGE   Take 500 mg by mouth 2 (two) times daily with a meal.      metoprolol succinate 100 MG 24 hr tablet   Commonly known as: TOPROL-XL   Take 100 mg by mouth daily. Take with or immediately following a meal.      peginterferon alfa-2b 50 MCG/0.5ML injection   Commonly known as: PEG-INTRON   Inject 50 mcg into the skin once a week.      ribavirin 200 MG capsule   Commonly known as: REBETOL   Take 600 mg by mouth 2 (two) times daily.           Follow-up Information    Follow up with JONES,DAVID S, MD. Schedule an appointment as soon as possible for a visit in 2 weeks.   Contact information:   [address] [place] [address] [phone]          Signed: Zhamir Pirro L 11/28/2012, 11:19 AM

## 2012-11-28 NOTE — Progress Notes (Signed)
Occupational Therapy Treatment and Dishcharge Patient Details Name: Herbert Chapman MRN: [id number] DOB: Apr 17, 1946 Today's Date: 11/28/2012 Time: 4098-1191 OT Time Calculation (min): 15 min  OT Assessment / Plan / Recommendation Comments on Treatment Session All education completed, D/C from acute OT.    Follow Up Recommendations  No OT follow up       Equipment Recommendations  None recommended by OT       Frequency Min 2X/week   Plan Discharge plan remains appropriate    Precautions / Restrictions Precautions Precautions: Back Precaution Comments: Pt required prompting to recall "no arching" as well as occassional cueing for No twisting with functional mobility Required Braces or Orthoses: Spinal Brace;Other Brace/Splint Spinal Brace: Lumbar corset;Applied in sitting position Other Brace/Splint: R AFO Restrictions Weight Bearing Restrictions: No       ADL  Tub/Shower Transfer: Performed;Supervision/safety Tub/Shower Transfer Method: Science writer:  (side step into tub) Equipment Used: Rolling walker ADL Comments: Pt's partner reports they can get a shower chair from family if the patient feels like he needs it. Pt cannot currently cross his legs to get to his feet but reports that his partner can A him prn for LB B/D.      OT Goals ADL Goals ADL Goal: Tub/Shower Transfer - Progress: Met  Visit Information  Last OT Received On: 11/28/12 Assistance Needed: +1          Cognition  Overall Cognitive Status: Appears within functional limits for tasks assessed/performed Arousal/Alertness: Awake/alert Orientation Level: Appears intact for tasks assessed Behavior During Session: [facility] for tasks performed    Mobility   Bed Mobility Bed Mobility: Not assessed Transfers Sit to Stand: 5: Supervision;With upper extremity assist;From bed Stand to Sit: 5: Supervision;With upper extremity assist;To bed Details for Transfer Assistance: Cues to  reinforce safest hand placement             End of Session OT - End of Session Equipment Utilized During Treatment: Back brace Activity Tolerance: Patient tolerated treatment well Patient left:  (sitting EOB waiting to be taken down for D/C)       Evette Georges [phone] 11/28/2012, 3:36 PM

## 2012-11-28 NOTE — Progress Notes (Signed)
Physical Therapy Treatment Patient Details Name: Herbert Chapman MRN: [id number] DOB: 1946/01/04 Today's Date: 11/28/2012 Time: 6644-0347 PT Time Calculation (min): 23 min  PT Assessment / Plan / Recommendation Comments on Treatment Session  Pt moving well at this date.  R AFO utilized this session.  Pt reports AFO fits comfortably & makes ambulating easier.  Pt ambulated ~200' with RW & performed stairs this session.  Pt with d/c orders at this time.  Pt is appropriate to d/c home today.       Follow Up Recommendations  No PT follow up;Supervision for mobility/OOB     Does the patient have the potential to tolerate intense rehabilitation     Barriers to Discharge        Equipment Recommendations  Rolling walker with 5" wheels    Recommendations for Other Services    Frequency Min 5X/week   Plan Discharge plan remains appropriate    Precautions / Restrictions Precautions Precautions: Back Precaution Comments: Pt required prompting to recall "no arching" as well as occassional cueing for No twisting with functional mobility Required Braces or Orthoses: Spinal Brace;Other Brace/Splint Spinal Brace: Lumbar corset;Applied in sitting position Other Brace/Splint: R AFO Restrictions Weight Bearing Restrictions: No       Mobility  Bed Mobility Bed Mobility: Not assessed Transfers Transfers: Sit to Stand;Stand to Sit Sit to Stand: 5: Supervision;With upper extremity assist;From bed Stand to Sit: 5: Supervision;With upper extremity assist;To bed Details for Transfer Assistance: Cues to reinforce safest hand placement Ambulation/Gait Ambulation/Gait Assistance: 5: Supervision Ambulation Distance (Feet): 200 Feet Assistive device: Rolling walker Ambulation/Gait Assistance Details: Cues to reinforce "No twisting".  Pt ambulated with R AFO.   Gait Pattern: Step-through pattern;Decreased stride length Stairs: Yes Stairs Assistance: 4: Min guard Stair Management Technique: Two  rails;Step to pattern;Forwards Number of Stairs: 6     Exercises     PT Diagnosis:    PT Problem List:   PT Treatment Interventions:     PT Goals Acute Rehab PT Goals Time For Goal Achievement: 12/04/12 Potential to Achieve Goals: Good Pt will go Supine/Side to Sit: with modified independence Pt will go Sit to Supine/Side: with modified independence Pt will go Sit to Stand: with modified independence PT Goal: Sit to Stand - Progress: Progressing toward goal Pt will go Stand to Sit: with modified independence PT Goal: Stand to Sit - Progress: Progressing toward goal Pt will Transfer Bed to Chair/Chair to Bed: with modified independence Pt will Ambulate: >150 feet;with modified independence;with least restrictive assistive device PT Goal: Ambulate - Progress: Progressing toward goal Pt will Go Up / Down Stairs: 6-9 stairs;with supervision;with rail(s) PT Goal: Up/Down Stairs - Progress: Progressing toward goal  Visit Information  Last PT Received On: 11/28/12 Assistance Needed: +1    Subjective Data  Subjective: "Im doing so much better" Patient Stated Goal: to go home   Cognition  Overall Cognitive Status: Appears within functional limits for tasks assessed/performed Arousal/Alertness: Awake/alert Orientation Level: Appears intact for tasks assessed Behavior During Session: [facility] for tasks performed    Balance     End of Session PT - End of Session Equipment Utilized During Treatment: Gait belt;Back brace Activity Tolerance: Patient tolerated treatment well Patient left: Other (comment);with family/visitor present;with call bell/phone within reach (sitting EOB)     Verdell Face, PTA [phone] 11/28/2012

## 2012-11-30 MED FILL — Sodium Chloride IV Soln 0.9%: INTRAVENOUS | Qty: 1000 | Status: AC

## 2012-11-30 MED FILL — Heparin Sodium (Porcine) Inj 1000 Unit/ML: INTRAMUSCULAR | Qty: 30 | Status: AC

## 2012-11-30 MED FILL — Sodium Chloride Irrigation Soln 0.9%: Qty: 3000 | Status: AC

## 2013-03-29 ENCOUNTER — Other Ambulatory Visit (HOSPITAL_COMMUNITY): Payer: Self-pay | Admitting: Neurological Surgery

## 2013-03-29 DIAGNOSIS — M25561 Pain in right knee: Secondary | ICD-10-CM

## 2013-03-30 ENCOUNTER — Ambulatory Visit (HOSPITAL_COMMUNITY): Payer: Medicare Other

## 2013-03-31 ENCOUNTER — Ambulatory Visit (HOSPITAL_COMMUNITY)
Admission: RE | Admit: 2013-03-31 | Discharge: 2013-03-31 | Disposition: A | Discharge status: Home / Self Care | Payer: Medicare Other | Source: Ambulatory Visit | Attending: Neurological Surgery | Admitting: Neurological Surgery

## 2013-03-31 DIAGNOSIS — M5430 Sciatica, unspecified side: Secondary | ICD-10-CM | POA: Insufficient documentation

## 2013-03-31 DIAGNOSIS — M25561 Pain in right knee: Secondary | ICD-10-CM

## 2013-03-31 DIAGNOSIS — M79609 Pain in unspecified limb: Secondary | ICD-10-CM

## 2013-03-31 NOTE — Progress Notes (Signed)
*  PRELIMINARY RESULTS* Vascular Ultrasound Left lower extremity venous duplex has been completed.  Preliminary findings: Right:  No evidence of DVT, superficial thrombosis, or Baker's cyst.  Called report to Crystal at Dr. Yetta Barre' office.  Farrel Demark, RDMS, RVT  03/31/2013, 2:00 PM

## 2013-04-21 ENCOUNTER — Emergency Department: Payer: Self-pay | Admitting: Emergency Medicine

## 2013-04-22 LAB — URINALYSIS, COMPLETE
Bacteria: NONE SEEN
Bilirubin,UR: NEGATIVE
Blood: NEGATIVE
Glucose,UR: NEGATIVE mg/dL (ref 0–75)
Leukocyte Esterase: NEGATIVE
Nitrite: NEGATIVE
Ph: 6 (ref 4.5–8.0)
Protein: NEGATIVE
RBC,UR: <1 /HPF (ref 0–5)
Specific Gravity: 1.016 (ref 1.003–1.030)
Squamous Epithelial: NONE SEEN
WBC UR: NONE SEEN /HPF (ref 0–5)
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
## 2013-06-09 IMAGING — CT CT CERVICAL SPINE WITHOUT CONTRAST
1 series · 12 of 14 positions shown, 15 images · non-contrast
Comparison: none

REASON FOR EXAM: pain
COMMENTS:

PROCEDURE:     CT  - CT CERVICAL SPINE WO  - January 01, 2012 [DATE]
RESULT:     Colon pain.

[Series 5: axial · axial · 0.28mm/px · z∈[-249,-84]mm · 12 of 100 slices shown, 15 images]
[im 8/100  soft-tissue]
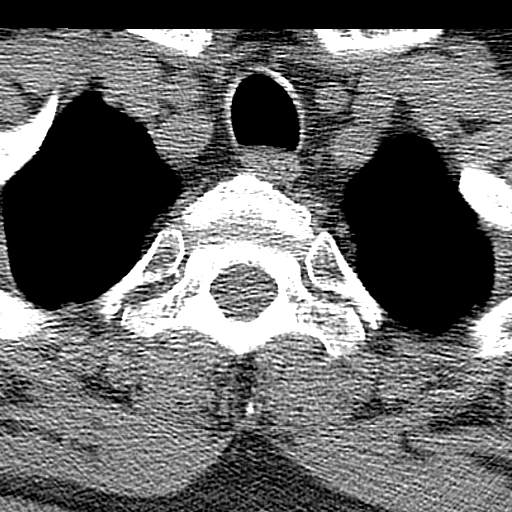
[im 8/100  bone]
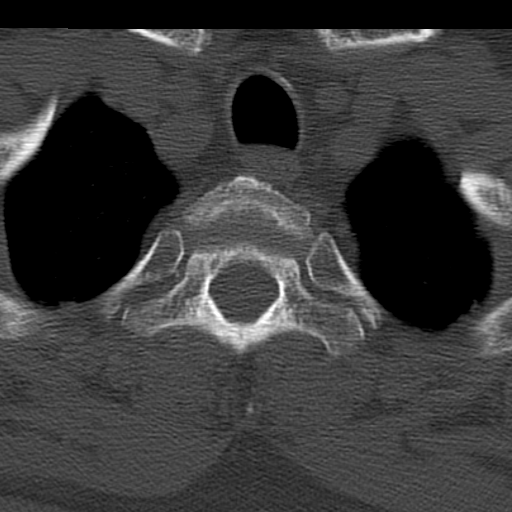
[im 16/100  bone]
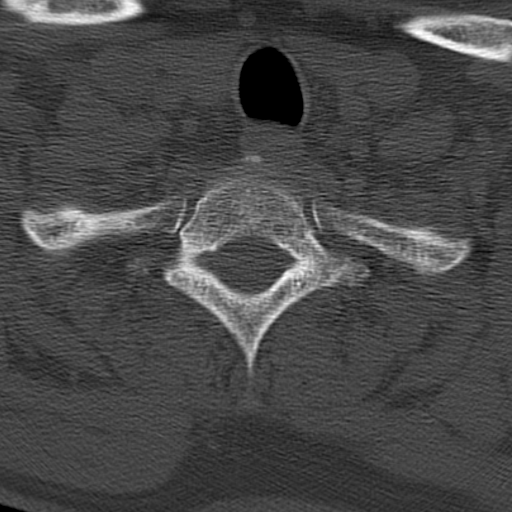
[im 23/100  bone]
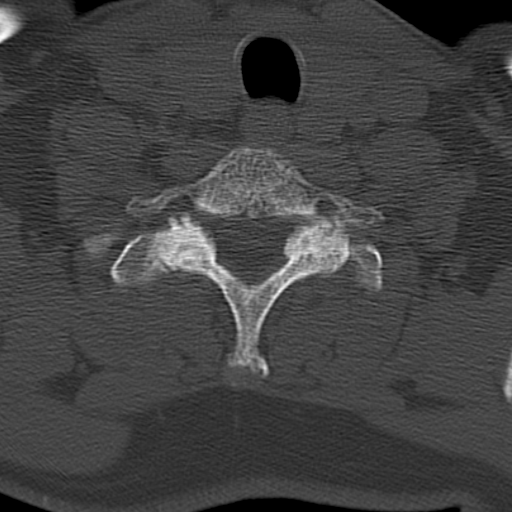
[im 31/100  bone]
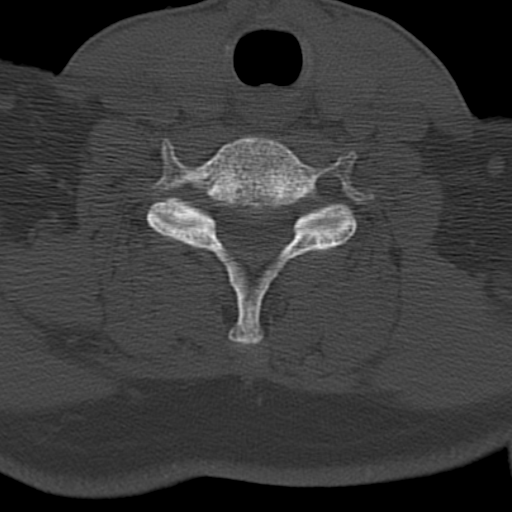
[im 39/100  soft-tissue]
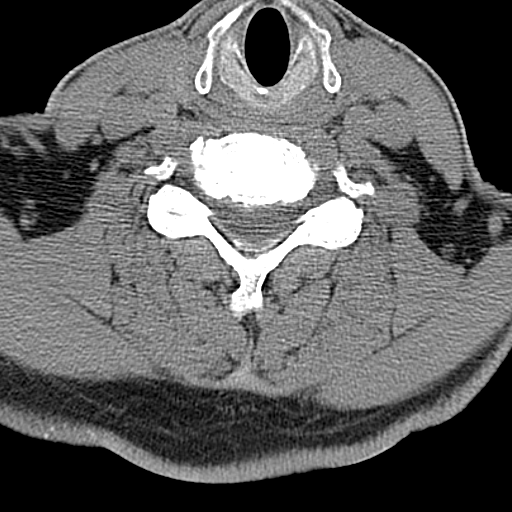
[im 39/100  bone]
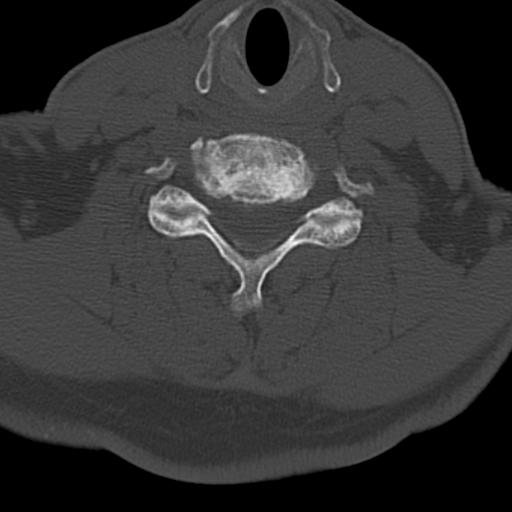
[im 46/100  bone]
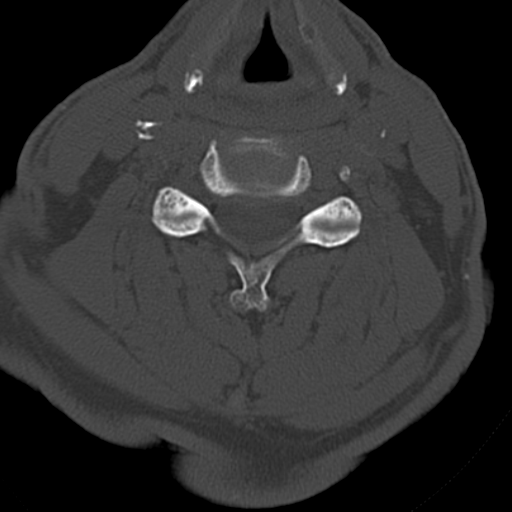
[im 54/100  bone]
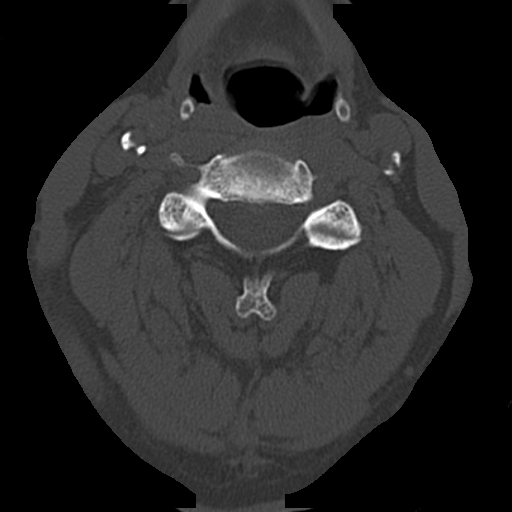
[im 61/100  bone]
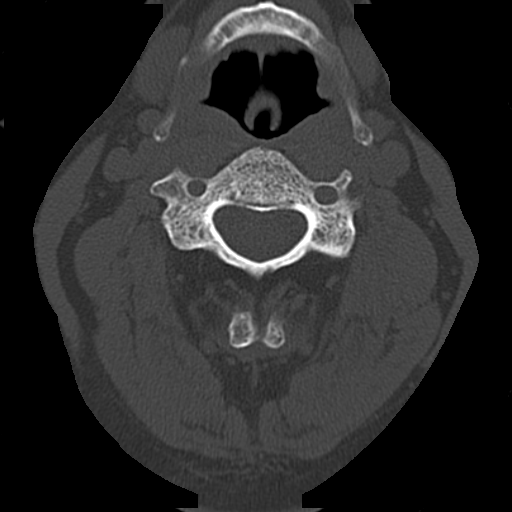
[im 69/100  soft-tissue]
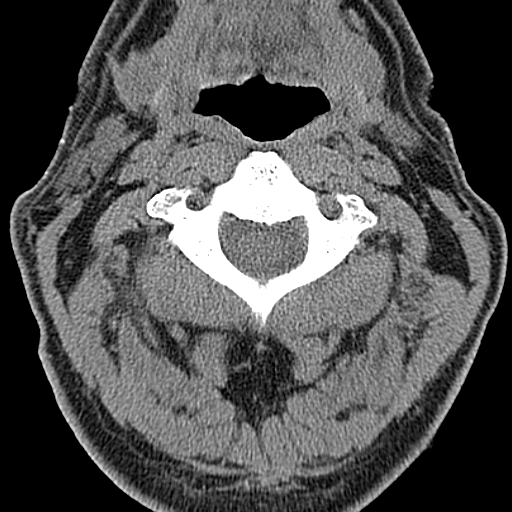
[im 69/100  bone]
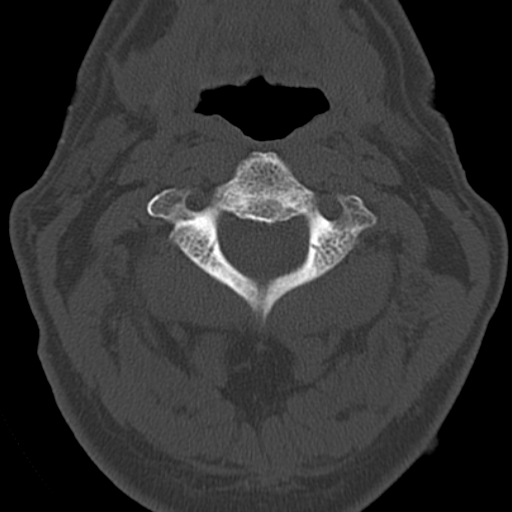
[im 77/100  bone]
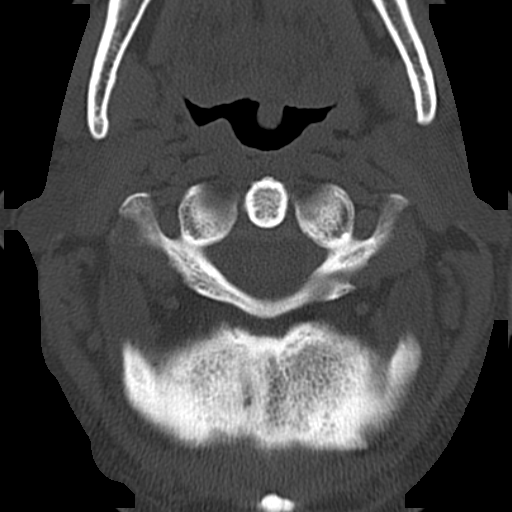
[im 84/100  bone]
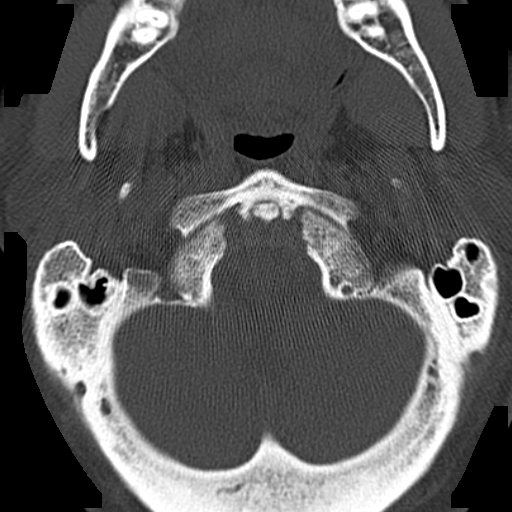
[im 92/100  bone]
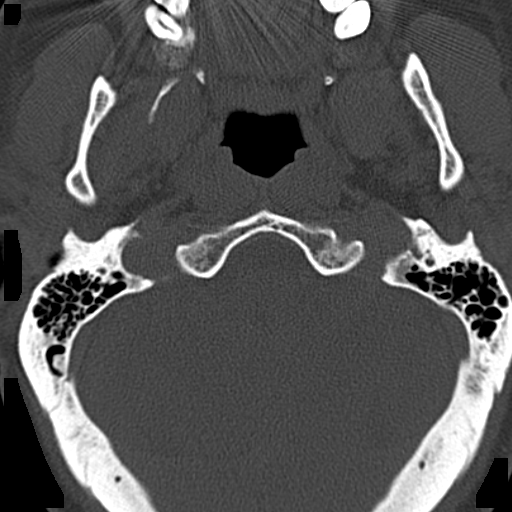

[12 of 14 positions shown; findings below may reference images not displayed]

FINDINGS: Standard CT of the cervical spine is obtained. Carotid vascular
calcification present. No evidence of fracture or dislocation. Prominent
degenerative changes noted C5-C6.
IMPRESSION: 1. Severe degenerative change C5-C6. No acute abnormality.
2. Carotid vascular disease.

## 2013-11-30 ENCOUNTER — Emergency Department: Payer: Self-pay | Admitting: Emergency Medicine

## 2013-11-30 LAB — COMPREHENSIVE METABOLIC PANEL
Albumin: 4.1 g/dL (ref 3.4–5.0)
Alkaline Phosphatase: 113 U/L
BUN: 22 mg/dL — ABNORMAL HIGH (ref 7–18)
Chloride: 95 mmol/L — ABNORMAL LOW (ref 98–107)
Co2: 29 mmol/L (ref 21–32)
Creatinine: 0.89 mg/dL (ref 0.60–1.30)
EGFR (African American): >60
EGFR (Non-African Amer.): >60
Glucose: 237 mg/dL — ABNORMAL HIGH (ref 65–99)
Potassium: 3.9 mmol/L (ref 3.5–5.1)
SGOT(AST): 32 U/L (ref 15–37)
SGPT (ALT): 48 U/L (ref 12–78)
Sodium: 131 mmol/L — ABNORMAL LOW (ref 136–145)
Total Protein: 8.3 g/dL — ABNORMAL HIGH (ref 6.4–8.2)

## 2013-11-30 LAB — URINALYSIS, COMPLETE
Hyaline Cast: 1
Leukocyte Esterase: NEGATIVE
Ph: 5 (ref 4.5–8.0)
Protein: 100
Specific Gravity: 1.027 (ref 1.003–1.030)
WBC UR: <1 /HPF (ref 0–5)

## 2013-11-30 LAB — CBC
HGB: 17.2 g/dL (ref 13.0–18.0)
MCH: 31.9 pg (ref 26.0–34.0)
MCHC: 34.6 g/dL (ref 32.0–36.0)
MCV: 92 fL (ref 80–100)
Platelet: 184 10*3/uL (ref 150–440)
RBC: 5.38 10*6/uL (ref 4.40–5.90)
RDW: 13.8 % (ref 11.5–14.5)
WBC: 12.6 10*3/uL — ABNORMAL HIGH (ref 3.8–10.6)

## 2014-10-03 ENCOUNTER — Ambulatory Visit: Payer: Self-pay | Admitting: Gastroenterology

## 2015-03-16 DIAGNOSIS — G8929 Other chronic pain: Secondary | ICD-10-CM | POA: Insufficient documentation

## 2015-03-16 DIAGNOSIS — N401 Enlarged prostate with lower urinary tract symptoms: Secondary | ICD-10-CM | POA: Insufficient documentation

## 2015-03-16 DIAGNOSIS — M549 Dorsalgia, unspecified: Secondary | ICD-10-CM

## 2015-08-16 ENCOUNTER — Other Ambulatory Visit: Payer: Self-pay | Admitting: Anesthesiology

## 2015-08-16 DIAGNOSIS — M5416 Radiculopathy, lumbar region: Secondary | ICD-10-CM

## 2015-08-23 ENCOUNTER — Ambulatory Visit
Admission: RE | Admit: 2015-08-23 | Discharge: 2015-08-23 | Disposition: A | Discharge status: Home / Self Care | Payer: Medicare Other | Source: Ambulatory Visit | Attending: Anesthesiology | Admitting: Anesthesiology

## 2015-08-23 DIAGNOSIS — M5416 Radiculopathy, lumbar region: Secondary | ICD-10-CM | POA: Diagnosis not present

## 2015-08-23 DIAGNOSIS — M5126 Other intervertebral disc displacement, lumbar region: Secondary | ICD-10-CM | POA: Diagnosis not present

## 2015-08-23 MED ORDER — GADOBENATE DIMEGLUMINE 529 MG/ML IV SOLN
20.0000 mL | Freq: Once | INTRAVENOUS | Status: AC | PRN
  Administered 2015-08-23: 17 mL via INTRAVENOUS

## 2015-11-07 ENCOUNTER — Other Ambulatory Visit: Payer: Self-pay | Admitting: Family Medicine

## 2015-11-07 ENCOUNTER — Encounter: Payer: Self-pay | Admitting: Family Medicine

## 2015-11-07 DIAGNOSIS — Z87891 Personal history of nicotine dependence: Secondary | ICD-10-CM

## 2015-11-07 HISTORY — DX: Personal history of nicotine dependence: Z87.891

## 2015-11-08 ENCOUNTER — Inpatient Hospital Stay: Payer: Medicare Other | Attending: Family Medicine | Admitting: Family Medicine

## 2015-11-08 ENCOUNTER — Encounter: Payer: Self-pay | Admitting: Family Medicine

## 2015-11-08 ENCOUNTER — Ambulatory Visit
Admission: RE | Admit: 2015-11-08 | Discharge: 2015-11-08 | Disposition: A | Discharge status: Home / Self Care | Payer: Medicare Other | Source: Ambulatory Visit | Attending: Family Medicine | Admitting: Family Medicine

## 2015-11-08 DIAGNOSIS — Z87891 Personal history of nicotine dependence: Secondary | ICD-10-CM

## 2015-11-08 DIAGNOSIS — I2510 Atherosclerotic heart disease of native coronary artery without angina pectoris: Secondary | ICD-10-CM | POA: Insufficient documentation

## 2015-11-08 DIAGNOSIS — Z122 Encounter for screening for malignant neoplasm of respiratory organs: Secondary | ICD-10-CM

## 2015-11-08 NOTE — Progress Notes (Signed)
In accordance with CMS guidelines, patient has meet eligibility criteria including age, absence of signs or symptoms of lung cancer, the specific calculation of cigarette smoking pack-years was 35 years and is a current smoker.   A shared decision-making session was conducted prior to the performance of CT scan. This includes one or more decision aids, includes benefits and harms of screening, follow-up diagnostic testing, over-diagnosis, false positive rate, and total radiation exposure.  Counseling on the importance of adherence to annual lung cancer LDCT screening, impact of co-morbidities, and ability or willingness to undergo diagnosis and treatment is imperative for compliance of the program.  Counseling on the importance of continued smoking cessation for former smokers; the importance of smoking cessation for current smokers and information about tobacco cessation interventions have been given to patient including the [place] at [facility], 1800 quit [place], as well as Cancer Center specific smoking cessation programs.  Written order for lung cancer screening with LDCT has been given to the patient and any and all questions have been answered to the best of my abilities.   Yearly follow up will be scheduled by Shawn Perkins, Thoracic Navigator.   

## 2015-11-13 ENCOUNTER — Telehealth: Payer: Self-pay | Admitting: *Deleted

## 2015-11-13 NOTE — Telephone Encounter (Signed)
Notified patient of LDCT lung cancer screening results of Lung Rads 1S finding with recommendation for 12 month follow up imaging. Also notified of incidental finding noted below. Patient verbalizes understanding.   IMPRESSION: 1. Lung-RADS Category 1S, negative. Continue annual screening with low-dose chest CT without contrast in 12 months. 2. The "S" modifier above refers to potentially clinically significant non lung cancer related findings. Specifically, there is atherosclerosis, including three-vessel coronary artery disease. Please note that although the presence of coronary artery calcium documents the presence of coronary artery disease, the severity of this disease and any potential stenosis cannot be assessed on this non-gated CT examination. Assessment for potential risk factor modification, dietary therapy or pharmacologic therapy may be warranted, if clinically indicated.

## 2016-02-23 ENCOUNTER — Other Ambulatory Visit: Payer: Self-pay | Admitting: Infectious Diseases

## 2016-02-23 DIAGNOSIS — N183 Chronic kidney disease, stage 3 unspecified: Secondary | ICD-10-CM | POA: Insufficient documentation

## 2016-02-23 DIAGNOSIS — I7090 Unspecified atherosclerosis: Secondary | ICD-10-CM | POA: Insufficient documentation

## 2016-03-05 ENCOUNTER — Ambulatory Visit
Admission: RE | Admit: 2016-03-05 | Discharge: 2016-03-05 | Disposition: A | Discharge status: Home / Self Care | Payer: Medicare Other | Source: Ambulatory Visit | Attending: Infectious Diseases | Admitting: Infectious Diseases

## 2016-03-05 DIAGNOSIS — N281 Cyst of kidney, acquired: Secondary | ICD-10-CM | POA: Diagnosis not present

## 2016-03-05 DIAGNOSIS — N183 Chronic kidney disease, stage 3 unspecified: Secondary | ICD-10-CM

## 2016-03-28 ENCOUNTER — Emergency Department
Admission: EM | Admit: 2016-03-28 | Discharge: 2016-03-29 | Disposition: A | Discharge status: Home / Self Care | Payer: Medicare Other | Attending: Emergency Medicine | Admitting: Emergency Medicine

## 2016-03-28 DIAGNOSIS — K219 Gastro-esophageal reflux disease without esophagitis: Secondary | ICD-10-CM | POA: Insufficient documentation

## 2016-03-28 DIAGNOSIS — Z79891 Long term (current) use of opiate analgesic: Secondary | ICD-10-CM | POA: Diagnosis not present

## 2016-03-28 DIAGNOSIS — I10 Essential (primary) hypertension: Secondary | ICD-10-CM | POA: Insufficient documentation

## 2016-03-28 DIAGNOSIS — E119 Type 2 diabetes mellitus without complications: Secondary | ICD-10-CM | POA: Diagnosis not present

## 2016-03-28 DIAGNOSIS — F329 Major depressive disorder, single episode, unspecified: Secondary | ICD-10-CM | POA: Diagnosis not present

## 2016-03-28 DIAGNOSIS — H7291 Unspecified perforation of tympanic membrane, right ear: Secondary | ICD-10-CM | POA: Diagnosis not present

## 2016-03-28 DIAGNOSIS — M1990 Unspecified osteoarthritis, unspecified site: Secondary | ICD-10-CM | POA: Diagnosis not present

## 2016-03-28 DIAGNOSIS — F17210 Nicotine dependence, cigarettes, uncomplicated: Secondary | ICD-10-CM | POA: Insufficient documentation

## 2016-03-28 DIAGNOSIS — Z7982 Long term (current) use of aspirin: Secondary | ICD-10-CM | POA: Insufficient documentation

## 2016-03-28 DIAGNOSIS — H9211 Otorrhea, right ear: Secondary | ICD-10-CM | POA: Diagnosis present

## 2016-03-28 DIAGNOSIS — Z7984 Long term (current) use of oral hypoglycemic drugs: Secondary | ICD-10-CM | POA: Diagnosis not present

## 2016-03-28 NOTE — ED Provider Notes (Signed)
[facility] Emergency Department Provider Note  ____________________________________________  Time seen: On arrival  I have reviewed the triage vital signs and the nursing notes.   HISTORY  Chief Complaint Ear Drainage    HPI Herbert Chapman is a 70 y.o. male who presents with complaints of bleeding from his right ear. He reports this started somewhat abruptly but did have pain in his right ear prior to recurring. He does use Q-tips to clean his ears reports he may have pushed harder than normal today. He denies fevers or chills.    Past Medical History  Diagnosis Date  . Hypertension   . Anxiety   . GERD (gastroesophageal reflux disease)   . Diabetes mellitus without complication ([place])   . Mental disorder   . Depression   . Pancreatitis 2012  . Cancer ([place])     squamous cell CA removed from skin  . Arthritis   . Hepatitis     Hep C  . Renal cysts, acquired, bilateral   . Personal history of tobacco use, presenting hazards to health 11/07/2015    Patient Active Problem List   Diagnosis Date Noted  . Personal history of tobacco use, presenting hazards to health 11/07/2015    Past Surgical History  Procedure Laterality Date  . Cervical spine surgery    . Tonsillectomy    . Eye surgery      bilateral cataract removal; implants  . Colonoscopy    . Lumbar laminectomy/decompression microdiscectomy  10/07/2012    Procedure: LUMBAR LAMINECTOMY/DECOMPRESSION MICRODISCECTOMY 2 LEVELS;  Surgeon: Eustace Moore, MD;  Location: [facility];  Service: Neurosurgery;  Laterality: Left;  Left Lumbar two-three,lumbar four-five hemilaminectomy    Current Outpatient Rx  Name  Route  Sig  Dispense  Refill  . aspirin 81 MG tablet   Oral   Take 162 mg by mouth daily.         . famotidine (PEPCID) 20 MG tablet   Oral   Take 20 mg by mouth daily as needed. For reflux         . FLUoxetine (PROZAC) 20 MG capsule   Oral   Take 20 mg by mouth 3 (three)  times daily.          . hydrochlorothiazide (HYDRODIURIL) 25 MG tablet   Oral   Take 25 mg by mouth daily.         Marland Kitchen HYDROcodone-acetaminophen (NORCO/VICODIN) 5-325 MG per tablet   Oral   Take 1-2 tablets by mouth every 6 (six) hours as needed for pain.   60 tablet   1   . lisinopril (PRINIVIL,ZESTRIL) 20 MG tablet   Oral   Take 20 mg by mouth daily.         . metFORMIN (GLUCOPHAGE) 500 MG tablet   Oral   Take 500 mg by mouth 2 (two) times daily with a meal.         . metoprolol succinate (TOPROL-XL) 100 MG 24 hr tablet   Oral   Take 100 mg by mouth daily. Take with or immediately following a meal.         . peginterferon alfa-2b (PEG-INTRON) 50 MCG/0.5ML injection   Subcutaneous   Inject 50 mcg into the skin once a week.         . ribavirin (REBETOL) 200 MG capsule   Oral   Take 600 mg by mouth 2 (two) times daily.           Allergies  Review of patient's allergies indicates no known allergies.  No family history on file.  Social History Social History  Substance Use Topics  . Smoking status: Current Every Day Smoker -- 1.00 packs/day for 30 years    Types: Cigarettes  . Smokeless tobacco: Not on file  . Alcohol Use: Yes     Comment: occassionally    Review of Systems  Constitutional: Negative for fever. Eyes: Negative for Redness ENT: Negative for sore throat, decreased hearing in the right    Skin: Negative for rash. Neurological: Negative for headaches    ____________________________________________   PHYSICAL EXAM:  VITAL SIGNS: ED Triage Vitals  Enc Vitals Group     BP 03/28/16 2241 142/70 mmHg     Pulse Rate 03/28/16 2241 55     Resp 03/28/16 2241 18     Temp 03/28/16 2241 98.6 F (37 C)     Temp Source 03/28/16 2241 Oral     SpO2 03/28/16 2241 98 %     Weight 03/28/16 2241 180 lb (81.647 kg)     Height 03/28/16 2241 5\' 7"  (1.702 m)     Head Cir --      Peak Flow --      Pain Score 03/28/16 2241 3     Pain Loc --       Pain Edu? --      Excl. in [place]? --     Constitutional: Alert and oriented. Well appearing and in no distress. Eyes: Conjunctivae are normal.  ENT   Head: Normocephalic and atraumatic.   Mouth/Throat: Mucous membranes are moist. Ears: Right ear: Blood in the external canal. Tympanic membrane appears ruptured Cardiovascular: Normal rate, regular rhythm.  Respiratory: Normal respiratory effort without tachypnea nor retractions.    Neurologic:  Normal speech and language. No gross focal neurologic deficits are appreciated. Skin:  Skin is warm, dry and intact. No rash noted. Psychiatric: Mood and affect are normal. Patient exhibits appropriate insight and judgment.  ____________________________________________    LABS (pertinent positives/negatives)  Labs Reviewed - No data to display  ____________________________________________     ____________________________________________    RADIOLOGY I have personally reviewed any xrays that were ordered on this patient: None  ____________________________________________   PROCEDURES  Procedure(s) performed: none   ____________________________________________   INITIAL IMPRESSION / ASSESSMENT AND PLAN / ED COURSE  Pertinent labs & imaging results that were available during my care of the patient were reviewed by me and considered in my medical decision making (see chart for details).  Unclear cause of TM rupture, patient did have some pain prior to bleeding. I will start him on antibiotics and have him follow-up with ENT  ____________________________________________   FINAL CLINICAL IMPRESSION(S) / ED DIAGNOSES  Final diagnoses:  Ruptured tympanic membrane, right     Lavonia Drafts, MD 03/29/16 [phone]

## 2016-03-28 NOTE — ED Notes (Signed)
Pt states he felt a "pop" to right ear, states he felt pain and then noted small amt of blood from ear. States now hearing is muffled.

## 2016-03-29 MED ORDER — AMOXICILLIN 500 MG PO CAPS: 500.0000 mg | ORAL_CAPSULE | Freq: Two times a day (BID) | ORAL | Status: DC

## 2016-03-29 MED ORDER — OFLOXACIN 0.3 % OT SOLN: 5.0000 [drp] | Freq: Two times a day (BID) | OTIC | Status: DC

## 2016-03-29 NOTE — Discharge Instructions (Signed)
Eardrum Perforation  An eardrum perforation is a puncture or tear in the eardrum. This is also called a ruptured eardrum. The eardrum is a thin, round tissue inside of your ear that separates your ear canal from your middle ear. This is the tissue that detects sound and enables you to hear. An eardrum perforation can cause discomfort and hearing loss. In most cases, the eardrum will heal without treatment and with little or no permanent hearing loss.  CAUSES  An eardrum perforation can result from different causes, including:  · Sudden pressure changes that happen in situations such as scuba diving or flying in an airplane.  · Foreign objects in the ear.  · Inserting a cotton-tipped swab or any blunt object into the ear.  · Loud noise.  · Trauma to the ear.  · Attempting to remove an object from the ear.  SIGNS AND SYMPTOMS  · Hearing loss.  · Ear pain.  · Ringing in the ear.  · Discharge or bleeding from the ear.  · Dizziness.  · Vomiting.  · Facial paralysis.  DIAGNOSIS   Your health care provider will examine your ear using a tool called an otoscope. This tool allows the health care provider to see into your ear to examine your eardrum. Various types of hearing tests may also be done.  TREATMENT   Typically, the eardrum will heal on its own within a few weeks. If your eardrum does not heal, your health care provider may recommend one of the following treatments:  · A procedure to place a patch over your eardrum.  · Surgery to repair your eardrum.  HOME CARE INSTRUCTIONS   · Keep your ear dry. This will improve healing. Do not submerge your head under water until healing is complete. Do not swim or dive until your health care provider approves. While bathing or showering, protect your ear using one of these methods:    Using a waterproof earplug.    Covering a piece of cotton with petroleum jelly and placing it in your outer ear canal.  · Take medicines only as directed by your health care provider.  · Avoid  blowing your nose if possible. If you blow your nose, do it gently. Forceful blowing increases the pressure in your middle ear. This may cause further injury or may delay your healing.  · Resume your normal activities after the perforation has healed. Your health care provider can tell you when this has occurred.  · Talk to your health care provider before you fly on an airplane. Air travel is generally allowed with a perforated eardrum.  · Keep all follow-up visits as directed by your health care provider. This is important.  SEEK MEDICAL CARE IF:  · You have a fever.  SEEK IMMEDIATE MEDICAL CARE IF:  · You have blood or pus coming from your ear.  · You have dizziness or problems with balance.  · You have nausea or vomiting.  · You have increased pain.     This information is not intended to replace advice given to you by your health care provider. Make sure you discuss any questions you have with your health care provider.     Document Released: 11/22/2000 Document Revised: 12/16/2014 Document Reviewed: 07/04/2014  Elsevier Interactive Patient Education ©2016 [employer]

## 2016-10-15 ENCOUNTER — Telehealth: Payer: Self-pay | Admitting: *Deleted

## 2016-10-15 NOTE — Telephone Encounter (Signed)
Notified patient that annual lung cancer screening low dose CT scan is due. Confirmed that patient is within the age range of 55-77, and asymptomatic, (no signs or symptoms of lung cancer). Patient denies illness that would prevent curative treatment for lung cancer if found. The patient is a former smoker, quit 09/08/16, with a 30 pack year history. The shared decision making visit was done 11/08/15. Patient is agreeable for CT scan being scheduled, pending insurance approval.

## 2016-12-05 ENCOUNTER — Other Ambulatory Visit: Payer: Self-pay | Admitting: *Deleted

## 2016-12-05 DIAGNOSIS — Z87891 Personal history of nicotine dependence: Secondary | ICD-10-CM

## 2016-12-09 HISTORY — PX: SPINAL CORD STIMULATOR IMPLANT: SHX2422

## 2016-12-20 ENCOUNTER — Ambulatory Visit: Admission: RE | Admit: 2016-12-20 | Payer: Medicare Other | Source: Ambulatory Visit

## 2017-01-08 ENCOUNTER — Other Ambulatory Visit: Payer: Self-pay | Admitting: Neurological Surgery

## 2017-01-08 DIAGNOSIS — M48062 Spinal stenosis, lumbar region with neurogenic claudication: Secondary | ICD-10-CM

## 2017-01-13 ENCOUNTER — Other Ambulatory Visit: Payer: Medicare Other

## 2017-01-13 ENCOUNTER — Ambulatory Visit
Admission: RE | Admit: 2017-01-13 | Discharge: 2017-01-13 | Disposition: A | Discharge status: Home / Self Care | Payer: Medicare Other | Source: Ambulatory Visit | Attending: Neurological Surgery | Admitting: Neurological Surgery

## 2017-01-13 DIAGNOSIS — E119 Type 2 diabetes mellitus without complications: Secondary | ICD-10-CM | POA: Insufficient documentation

## 2017-01-13 DIAGNOSIS — F329 Major depressive disorder, single episode, unspecified: Secondary | ICD-10-CM | POA: Insufficient documentation

## 2017-01-13 DIAGNOSIS — I10 Essential (primary) hypertension: Secondary | ICD-10-CM | POA: Insufficient documentation

## 2017-01-13 DIAGNOSIS — IMO0002 Reserved for concepts with insufficient information to code with codable children: Secondary | ICD-10-CM | POA: Insufficient documentation

## 2017-01-13 DIAGNOSIS — M48062 Spinal stenosis, lumbar region with neurogenic claudication: Secondary | ICD-10-CM

## 2017-01-13 DIAGNOSIS — F32A Depression, unspecified: Secondary | ICD-10-CM | POA: Insufficient documentation

## 2017-01-13 DIAGNOSIS — B1920 Unspecified viral hepatitis C without hepatic coma: Secondary | ICD-10-CM | POA: Insufficient documentation

## 2017-01-13 MED ORDER — DIAZEPAM 5 MG PO TABS
5.0000 mg | ORAL_TABLET | Freq: Once | ORAL | Status: AC
  Administered 2017-01-13: 5 mg via ORAL

## 2017-01-13 MED ORDER — IOPAMIDOL (ISOVUE-M 200) INJECTION 41%
15.0000 mL | Freq: Once | INTRAMUSCULAR | Status: AC
  Administered 2017-01-13: 15 mL via INTRATHECAL

## 2017-01-13 NOTE — Progress Notes (Signed)
Patient states he has been off Prozac for at least the past two days.  jkl

## 2017-01-13 NOTE — Discharge Instructions (Addendum)
Myelogram Discharge Instructions  1. Go home and rest quietly for the next 24 hours.  It is important to lie flat for the next 24 hours.  Get up only to go to the restroom.  You may lie in the bed or on a couch on your back, your stomach, your left side or your right side.  You may have one pillow under your head.  You may have pillows between your knees while you are on your side or under your knees while you are on your back.  2. DO NOT drive today.  Recline the seat as far back as it will go, while still wearing your seat belt, on the way home.  3. You may get up to go to the bathroom as needed.  You may sit up for 10 minutes to eat.  You may resume your normal diet and medications unless otherwise indicated.  Drink plenty of extra fluids today and tomorrow.  4. The incidence of a spinal headache with nausea and/or vomiting is about 5% (one in 20 patients).  If you develop a headache, lie flat and drink plenty of fluids until the headache goes away.  Caffeinated beverages may be helpful.  If you develop severe nausea and vomiting or a headache that does not go away with flat bed rest, call [phone].  5. You may resume normal activities after your 24 hours of bed rest is over; however, do not exert yourself strongly or do any heavy lifting tomorrow.  6. Call your physician for a follow-up appointment.    You may resume Prozac on Tuesday, January 14, 2017 after 1:00p.m.

## 2017-01-23 ENCOUNTER — Telehealth: Payer: Self-pay | Admitting: *Deleted

## 2017-01-23 NOTE — Telephone Encounter (Signed)
Contacted patient regarding missed CT screening scan. Patient would like to reschedule. Please see prior documentation regarding eligibility.

## 2017-02-03 ENCOUNTER — Ambulatory Visit
Admission: RE | Admit: 2017-02-03 | Discharge: 2017-02-03 | Disposition: A | Discharge status: Home / Self Care | Payer: Medicare Other | Source: Ambulatory Visit | Attending: Oncology | Admitting: Oncology

## 2017-02-03 DIAGNOSIS — J439 Emphysema, unspecified: Secondary | ICD-10-CM | POA: Insufficient documentation

## 2017-02-03 DIAGNOSIS — Z87891 Personal history of nicotine dependence: Secondary | ICD-10-CM | POA: Diagnosis present

## 2017-02-03 DIAGNOSIS — I2510 Atherosclerotic heart disease of native coronary artery without angina pectoris: Secondary | ICD-10-CM | POA: Diagnosis not present

## 2017-02-07 ENCOUNTER — Encounter: Payer: Self-pay | Admitting: *Deleted

## 2018-01-27 ENCOUNTER — Telehealth: Payer: Self-pay | Admitting: *Deleted

## 2018-01-27 NOTE — Telephone Encounter (Signed)
Attempted to leave message for patient to notify them that it is time to schedule annual low dose lung cancer screening CT scan. However, since this option was not availabe, email sent to patient with information to contact me for scheduling.

## 2018-02-04 ENCOUNTER — Telehealth: Payer: Self-pay | Admitting: *Deleted

## 2018-02-04 NOTE — Telephone Encounter (Signed)
Attempted to leave message for patient to notify them that it is time to schedule annual low dose lung cancer screening CT scan. All numbers listed in EMR were tried without a voicemail option. Will mail notification.

## 2018-02-05 ENCOUNTER — Encounter: Payer: Self-pay | Admitting: *Deleted

## 2018-03-04 ENCOUNTER — Telehealth: Payer: Self-pay | Admitting: *Deleted

## 2018-03-04 DIAGNOSIS — Z87891 Personal history of nicotine dependence: Secondary | ICD-10-CM

## 2018-03-04 DIAGNOSIS — Z122 Encounter for screening for malignant neoplasm of respiratory organs: Secondary | ICD-10-CM

## 2018-03-04 NOTE — Telephone Encounter (Signed)
Notified patient that annual lung cancer screening low dose CT scan is due currently or will be in near future. Confirmed that patient is within the age range of 55-77, and asymptomatic, (no signs or symptoms of lung cancer). Patient denies illness that would prevent curative treatment for lung cancer if found. Verified smoking history, (current, 30.5 pack year). The shared decision making visit was done 11/08/15. Patient is agreeable for CT scan being scheduled.

## 2018-03-11 ENCOUNTER — Ambulatory Visit: Payer: Medicare Other

## 2018-03-13 ENCOUNTER — Ambulatory Visit
Admission: RE | Admit: 2018-03-13 | Discharge: 2018-03-13 | Disposition: A | Discharge status: Home / Self Care | Payer: Medicare Other | Source: Ambulatory Visit | Attending: Nurse Practitioner | Admitting: Nurse Practitioner

## 2018-03-13 DIAGNOSIS — Z87891 Personal history of nicotine dependence: Secondary | ICD-10-CM | POA: Insufficient documentation

## 2018-03-13 DIAGNOSIS — Z122 Encounter for screening for malignant neoplasm of respiratory organs: Secondary | ICD-10-CM

## 2018-03-13 DIAGNOSIS — J432 Centrilobular emphysema: Secondary | ICD-10-CM | POA: Diagnosis not present

## 2018-03-13 DIAGNOSIS — I2510 Atherosclerotic heart disease of native coronary artery without angina pectoris: Secondary | ICD-10-CM | POA: Diagnosis not present

## 2018-03-13 DIAGNOSIS — I700 Atherosclerosis of aorta: Secondary | ICD-10-CM | POA: Insufficient documentation

## 2018-03-13 DIAGNOSIS — N289 Disorder of kidney and ureter, unspecified: Secondary | ICD-10-CM | POA: Insufficient documentation

## 2018-03-16 ENCOUNTER — Encounter: Payer: Self-pay | Admitting: *Deleted

## 2019-02-25 ENCOUNTER — Encounter: Payer: Self-pay | Admitting: *Deleted

## 2019-02-28 ENCOUNTER — Encounter: Payer: Self-pay | Admitting: *Deleted

## 2019-05-11 ENCOUNTER — Telehealth: Payer: Self-pay | Admitting: *Deleted

## 2019-05-11 DIAGNOSIS — Z122 Encounter for screening for malignant neoplasm of respiratory organs: Secondary | ICD-10-CM

## 2019-05-11 DIAGNOSIS — Z87891 Personal history of nicotine dependence: Secondary | ICD-10-CM

## 2019-05-11 NOTE — Telephone Encounter (Signed)
Patient has been notified that annual lung cancer screening low dose CT scan is due currently or will be in near future. Confirmed that patient is within the age range of 55-77, and asymptomatic, (no signs or symptoms of lung cancer). Patient denies illness that would prevent curative treatment for lung cancer if found. Verified smoking history, (former, quit < 1 year ago, 30 pack year). The shared decision making visit was done 11/08/15. Patient is agreeable for CT scan being scheduled.

## 2019-05-14 ENCOUNTER — Ambulatory Visit
Admission: RE | Admit: 2019-05-14 | Discharge: 2019-05-14 | Disposition: A | Discharge status: Home / Self Care | Payer: Medicare Other | Source: Ambulatory Visit | Attending: Oncology | Admitting: Oncology

## 2019-05-14 ENCOUNTER — Other Ambulatory Visit: Payer: Self-pay

## 2019-05-14 DIAGNOSIS — Z87891 Personal history of nicotine dependence: Secondary | ICD-10-CM | POA: Insufficient documentation

## 2019-05-14 DIAGNOSIS — Z122 Encounter for screening for malignant neoplasm of respiratory organs: Secondary | ICD-10-CM

## 2019-05-19 ENCOUNTER — Encounter: Payer: Self-pay | Admitting: *Deleted

## 2019-08-23 ENCOUNTER — Other Ambulatory Visit: Payer: Self-pay | Admitting: Orthopedic Surgery

## 2019-08-23 DIAGNOSIS — M25511 Pain in right shoulder: Secondary | ICD-10-CM

## 2019-08-23 DIAGNOSIS — M19011 Primary osteoarthritis, right shoulder: Secondary | ICD-10-CM

## 2019-08-31 ENCOUNTER — Other Ambulatory Visit: Payer: Self-pay

## 2019-08-31 ENCOUNTER — Ambulatory Visit
Admission: RE | Admit: 2019-08-31 | Discharge: 2019-08-31 | Disposition: A | Discharge status: Home / Self Care | Payer: Medicare Other | Source: Ambulatory Visit | Attending: Orthopedic Surgery | Admitting: Orthopedic Surgery

## 2019-08-31 DIAGNOSIS — M19011 Primary osteoarthritis, right shoulder: Secondary | ICD-10-CM | POA: Insufficient documentation

## 2019-08-31 DIAGNOSIS — M25511 Pain in right shoulder: Secondary | ICD-10-CM | POA: Insufficient documentation

## 2019-09-08 ENCOUNTER — Other Ambulatory Visit: Payer: Self-pay | Admitting: Orthopedic Surgery

## 2019-09-14 ENCOUNTER — Other Ambulatory Visit: Payer: Self-pay | Admitting: Orthopedic Surgery

## 2019-09-14 DIAGNOSIS — M19011 Primary osteoarthritis, right shoulder: Secondary | ICD-10-CM

## 2019-09-22 ENCOUNTER — Ambulatory Visit
Admission: RE | Admit: 2019-09-22 | Discharge: 2019-09-22 | Disposition: A | Discharge status: Home / Self Care | Payer: Medicare Other | Source: Ambulatory Visit | Attending: Orthopedic Surgery | Admitting: Orthopedic Surgery

## 2019-09-22 ENCOUNTER — Other Ambulatory Visit: Payer: Self-pay

## 2019-09-22 DIAGNOSIS — M19011 Primary osteoarthritis, right shoulder: Secondary | ICD-10-CM

## 2019-10-08 ENCOUNTER — Other Ambulatory Visit
Admission: RE | Admit: 2019-10-08 | Discharge: 2019-10-08 | Disposition: A | Discharge status: Home / Self Care | Payer: Medicare Other | Source: Ambulatory Visit | Attending: Orthopedic Surgery | Admitting: Orthopedic Surgery

## 2019-10-08 ENCOUNTER — Other Ambulatory Visit: Payer: Self-pay

## 2019-10-08 ENCOUNTER — Other Ambulatory Visit: Payer: Medicare Other

## 2019-10-08 DIAGNOSIS — Z01818 Encounter for other preprocedural examination: Secondary | ICD-10-CM | POA: Insufficient documentation

## 2019-10-08 DIAGNOSIS — I10 Essential (primary) hypertension: Secondary | ICD-10-CM | POA: Diagnosis not present

## 2019-10-08 DIAGNOSIS — Z20828 Contact with and (suspected) exposure to other viral communicable diseases: Secondary | ICD-10-CM | POA: Diagnosis not present

## 2019-10-08 DIAGNOSIS — E118 Type 2 diabetes mellitus with unspecified complications: Secondary | ICD-10-CM | POA: Diagnosis not present

## 2019-10-08 HISTORY — DX: Headache, unspecified: R51.9

## 2019-10-08 HISTORY — DX: Personal history of urinary calculi: Z87.442

## 2019-10-08 LAB — PROTIME-INR
INR: 1.1 (ref 0.8–1.2)
Prothrombin Time: 13.7 seconds (ref 11.4–15.2)

## 2019-10-08 LAB — CBC
HCT: 47.1 % (ref 39.0–52.0)
Hemoglobin: 15.5 g/dL (ref 13.0–17.0)
MCH: 30.3 pg (ref 26.0–34.0)
MCHC: 32.9 g/dL (ref 30.0–36.0)
MCV: 92 fL (ref 80.0–100.0)
Platelets: 167 10*3/uL (ref 150–400)
RBC: 5.12 MIL/uL (ref 4.22–5.81)
RDW: 12.9 % (ref 11.5–15.5)
WBC: 8.1 10*3/uL (ref 4.0–10.5)
nRBC: 0 % (ref 0.0–0.2)

## 2019-10-08 LAB — URINALYSIS, ROUTINE W REFLEX MICROSCOPIC
Bilirubin Urine: NEGATIVE
Glucose, UA: NEGATIVE mg/dL
Hgb urine dipstick: NEGATIVE
Ketones, ur: NEGATIVE mg/dL
Leukocytes,Ua: NEGATIVE
Nitrite: NEGATIVE
Protein, ur: NEGATIVE mg/dL
Specific Gravity, Urine: 1.014 (ref 1.005–1.030)
pH: 5 (ref 5.0–8.0)

## 2019-10-08 LAB — BASIC METABOLIC PANEL
Anion gap: 9 (ref 5–15)
BUN: 18 mg/dL (ref 8–23)
CO2: 27 mmol/L (ref 22–32)
Calcium: 9.6 mg/dL (ref 8.9–10.3)
Chloride: 102 mmol/L (ref 98–111)
Creatinine, Ser: 1.09 mg/dL (ref 0.61–1.24)
GFR calc Af Amer: >60 mL/min (ref >60)
GFR calc non Af Amer: >60 mL/min (ref >60)
Glucose, Bld: 100 mg/dL — ABNORMAL HIGH (ref 70–99)
Potassium: 4.5 mmol/L (ref 3.5–5.1)
Sodium: 138 mmol/L (ref 135–145)

## 2019-10-08 LAB — SURGICAL PCR SCREEN
MRSA, PCR: NEGATIVE
Staphylococcus aureus: NEGATIVE

## 2019-10-08 LAB — TYPE AND SCREEN
ABO/RH(D): O NEG
Antibody Screen: NEGATIVE

## 2019-10-08 LAB — APTT: aPTT: 29 seconds (ref 24–36)

## 2019-10-08 NOTE — Patient Instructions (Signed)
Your procedure is scheduled on: 10-13-19 [facility] Report to Same Day Surgery 2nd floor medical mall [facility] Entrance-take elevator on left to 2nd floor.  Check in with surgery information desk.) To find out your arrival time please call [phone] between 1PM - 3PM on 10-12-19 TUESDAY  Remember: Instructions that are not followed completely may result in serious medical risk, up to and including death, or upon the discretion of your surgeon and anesthesiologist your surgery may need to be rescheduled.    _x___ 1. Do not eat food after midnight the night before your procedure. NO GUM OR CANDY AFTER MIDNIGHT. You may drink WATER up to 2 hours before you are scheduled to arrive at the hospital for your procedure.  Do not drink WATER within 2 hours of your scheduled arrival to the hospital.  Type 1 and type 2 diabetics should only drink water.   ____Ensure clear carbohydrate drink on the way to the hospital for bariatric patients  ____Ensure clear carbohydrate drink 3 hours before surgery.     __x__ 2. No Alcohol for 24 hours before or after surgery.   __x__3. No Smoking or e-cigarettes for 24 prior to surgery.  Do not use any chewable tobacco products for at least 6 hour prior to surgery   ____  4. Bring all medications with you on the day of surgery if instructed.    __x__ 5. Notify your doctor if there is any change in your medical condition     (cold, fever, infections).    x___6. On the morning of surgery brush your teeth with toothpaste and water.  You may rinse your mouth with mouth wash if you wish.  Do not swallow any toothpaste or mouthwash.   Do not wear jewelry, make-up, hairpins, clips or nail polish.  Do not wear lotions, powders, or perfumes.   Do not shave 48 hours prior to surgery. Men may shave face and neck.  Do not bring valuables to the hospital.    [facility] is not responsible for any belongings or valuables.               Contacts, dentures or  bridgework may not be worn into surgery.  Leave your suitcase in the car. After surgery it may be brought to your room.  For patients admitted to the hospital, discharge time is determined by your treatment team.  _  Patients discharged the day of surgery will not be allowed to drive home.  You will need someone to drive you home and stay with you the night of your procedure.    Please read over the following fact sheets that you were given:   [facility] Preparing for Surgery and or MRSA Information   _x___ TAKE THE FOLLOWING MEDICATION THE MORNING OF SURGERY WITH A SMALL SIP OF WATER. These include:  1. LIPITOR (ATORVASTATIN)  2. PROZAC (FLUOXETINE)  3. METOPROLOL (TOPROL)  4. OXYCODONE   5.  6.  ____Fleets enema or Magnesium Citrate as directed.   _x___ Use CHG Soap or sage wipes as directed on instruction sheet   ____ Use inhalers on the day of surgery and bring to hospital day of surgery  _X___ Stop Metformin 2 days prior to surgery-LAST DOSE Sunday 10-10-19    ____ Take 1/2 of usual insulin dose the night before surgery and none on the morning surgery.   _x___ Follow recommendations from Cardiologist, Pulmonologist or PCP regarding stopping Aspirin, Coumadin, Plavix ,Eliquis, Effient, or Pradaxa, and Pletal-LAST DOSE  OF ASPIRIN TODAY PER SURGEONS OFFICE  X____Stop Anti-inflammatories such as Advil, Aleve, Ibuprofen, Motrin, Naproxen,MELOXICAM (MOBIC) Naprosyn, Goodies powders or aspirin products. OK to take Tylenol   ____ Stop supplements until after surgery.     ____ Bring C-Pap to the hospital.

## 2019-10-09 LAB — SARS CORONAVIRUS 2 (TAT 6-24 HRS): SARS Coronavirus 2: NEGATIVE

## 2019-10-13 ENCOUNTER — Encounter: Payer: Self-pay | Admitting: Emergency Medicine

## 2019-10-13 ENCOUNTER — Encounter
Admission: RE | Disposition: A | Discharge status: Home Health | Payer: Self-pay | Source: Home / Self Care | Attending: Orthopedic Surgery

## 2019-10-13 ENCOUNTER — Other Ambulatory Visit: Payer: Self-pay

## 2019-10-13 ENCOUNTER — Inpatient Hospital Stay: Payer: Medicare Other

## 2019-10-13 ENCOUNTER — Inpatient Hospital Stay
Admission: RE | Admit: 2019-10-13 | Discharge: 2019-10-15 | DRG: 483 | Disposition: A | Discharge status: Home Health | Payer: Medicare Other | Attending: Orthopedic Surgery | Admitting: Orthopedic Surgery

## 2019-10-13 ENCOUNTER — Ambulatory Visit: Payer: Self-pay

## 2019-10-13 DIAGNOSIS — I10 Essential (primary) hypertension: Secondary | ICD-10-CM | POA: Diagnosis present

## 2019-10-13 DIAGNOSIS — Z96611 Presence of right artificial shoulder joint: Secondary | ICD-10-CM

## 2019-10-13 DIAGNOSIS — Z8719 Personal history of other diseases of the digestive system: Secondary | ICD-10-CM

## 2019-10-13 DIAGNOSIS — Z823 Family history of stroke: Secondary | ICD-10-CM

## 2019-10-13 DIAGNOSIS — Z20828 Contact with and (suspected) exposure to other viral communicable diseases: Secondary | ICD-10-CM | POA: Diagnosis present

## 2019-10-13 DIAGNOSIS — F419 Anxiety disorder, unspecified: Secondary | ICD-10-CM | POA: Diagnosis present

## 2019-10-13 DIAGNOSIS — K219 Gastro-esophageal reflux disease without esophagitis: Secondary | ICD-10-CM | POA: Diagnosis present

## 2019-10-13 DIAGNOSIS — Z8249 Family history of ischemic heart disease and other diseases of the circulatory system: Secondary | ICD-10-CM

## 2019-10-13 DIAGNOSIS — Z79899 Other long term (current) drug therapy: Secondary | ICD-10-CM

## 2019-10-13 DIAGNOSIS — Z8619 Personal history of other infectious and parasitic diseases: Secondary | ICD-10-CM | POA: Diagnosis not present

## 2019-10-13 DIAGNOSIS — G43909 Migraine, unspecified, not intractable, without status migrainosus: Secondary | ICD-10-CM | POA: Diagnosis present

## 2019-10-13 DIAGNOSIS — Z7984 Long term (current) use of oral hypoglycemic drugs: Secondary | ICD-10-CM

## 2019-10-13 DIAGNOSIS — Z09 Encounter for follow-up examination after completed treatment for conditions other than malignant neoplasm: Secondary | ICD-10-CM

## 2019-10-13 DIAGNOSIS — F329 Major depressive disorder, single episode, unspecified: Secondary | ICD-10-CM | POA: Diagnosis present

## 2019-10-13 DIAGNOSIS — E119 Type 2 diabetes mellitus without complications: Secondary | ICD-10-CM | POA: Diagnosis present

## 2019-10-13 DIAGNOSIS — M19011 Primary osteoarthritis, right shoulder: Secondary | ICD-10-CM | POA: Diagnosis present

## 2019-10-13 DIAGNOSIS — Z87891 Personal history of nicotine dependence: Secondary | ICD-10-CM | POA: Diagnosis not present

## 2019-10-13 DIAGNOSIS — Z87442 Personal history of urinary calculi: Secondary | ICD-10-CM | POA: Diagnosis not present

## 2019-10-13 DIAGNOSIS — Z7982 Long term (current) use of aspirin: Secondary | ICD-10-CM | POA: Diagnosis not present

## 2019-10-13 HISTORY — PX: TOTAL SHOULDER ARTHROPLASTY: SHX126

## 2019-10-13 LAB — GLUCOSE, CAPILLARY
Glucose-Capillary: 133 mg/dL — ABNORMAL HIGH (ref 70–99)
Glucose-Capillary: 146 mg/dL — ABNORMAL HIGH (ref 70–99)

## 2019-10-13 SURGERY — ARTHROPLASTY, SHOULDER, TOTAL
Anesthesia: General | Site: Shoulder | Laterality: Right

## 2019-10-13 MED ORDER — PROPOFOL 500 MG/50ML IV EMUL
INTRAVENOUS | Status: AC
  Filled 2019-10-13: qty 50

## 2019-10-13 MED ORDER — METHOCARBAMOL 1000 MG/10ML IJ SOLN
500.0000 mg | Freq: Four times a day (QID) | INTRAVENOUS | Status: DC | PRN
  Filled 2019-10-13: qty 5

## 2019-10-13 MED ORDER — SODIUM CHLORIDE 0.9 % IV SOLN
INTRAVENOUS | Status: DC | PRN
  Administered 2019-10-13: 15 ug/min via INTRAVENOUS

## 2019-10-13 MED ORDER — LACTATED RINGERS IV SOLN: INTRAVENOUS | Status: DC

## 2019-10-13 MED ORDER — LIDOCAINE HCL (CARDIAC) PF 100 MG/5ML IV SOSY
PREFILLED_SYRINGE | INTRAVENOUS | Status: DC | PRN
  Administered 2019-10-13: 80 mg via INTRAVENOUS

## 2019-10-13 MED ORDER — FENTANYL CITRATE (PF) 100 MCG/2ML IJ SOLN
50.0000 ug | Freq: Once | INTRAMUSCULAR | Status: AC
  Administered 2019-10-13: 10:00:00 50 ug via INTRAVENOUS

## 2019-10-13 MED ORDER — SUCCINYLCHOLINE CHLORIDE 20 MG/ML IJ SOLN
INTRAMUSCULAR | Status: DC | PRN
  Administered 2019-10-13: 100 mg via INTRAVENOUS

## 2019-10-13 MED ORDER — ROCURONIUM BROMIDE 100 MG/10ML IV SOLN
INTRAVENOUS | Status: DC | PRN
  Administered 2019-10-13: 5 mg via INTRAVENOUS

## 2019-10-13 MED ORDER — FENTANYL CITRATE (PF) 100 MCG/2ML IJ SOLN
INTRAMUSCULAR | Status: AC
  Filled 2019-10-13: qty 2

## 2019-10-13 MED ORDER — OXYCODONE HCL 5 MG PO TABS
5.0000 mg | ORAL_TABLET | ORAL | Status: DC | PRN
  Administered 2019-10-13: 10 mg via ORAL
  Administered 2019-10-14 – 2019-10-15 (×2): 5 mg via ORAL
  Administered 2019-10-15: 10 mg via ORAL
  Filled 2019-10-13 (×2): qty 2
  Filled 2019-10-13 (×2): qty 1

## 2019-10-13 MED ORDER — TRAMADOL HCL 50 MG PO TABS
50.0000 mg | ORAL_TABLET | Freq: Four times a day (QID) | ORAL | Status: DC
  Administered 2019-10-13 – 2019-10-15 (×7): 50 mg via ORAL
  Filled 2019-10-13 (×7): qty 1

## 2019-10-13 MED ORDER — HYDROMORPHONE HCL 1 MG/ML IJ SOLN
0.5000 mg | INTRAMUSCULAR | Status: DC | PRN
  Administered 2019-10-15: 1 mg via INTRAVENOUS
  Filled 2019-10-13: qty 1

## 2019-10-13 MED ORDER — MENTHOL 3 MG MT LOZG
1.0000 | LOZENGE | OROMUCOSAL | Status: DC | PRN
  Filled 2019-10-13: qty 9

## 2019-10-13 MED ORDER — FLUOXETINE HCL 20 MG PO CAPS
60.0000 mg | ORAL_CAPSULE | ORAL | Status: DC
  Administered 2019-10-14 – 2019-10-15 (×2): 60 mg via ORAL
  Filled 2019-10-13 (×2): qty 3

## 2019-10-13 MED ORDER — LISINOPRIL 20 MG PO TABS
40.0000 mg | ORAL_TABLET | ORAL | Status: DC
  Administered 2019-10-14 – 2019-10-15 (×2): 40 mg via ORAL
  Filled 2019-10-13 (×2): qty 2

## 2019-10-13 MED ORDER — SODIUM CHLORIDE 0.9 % IV SOLN
INTRAVENOUS | Status: DC
  Administered 2019-10-13 (×2): via INTRAVENOUS

## 2019-10-13 MED ORDER — BACITRACIN 50000 UNITS IM SOLR
INTRAMUSCULAR | Status: AC
  Filled 2019-10-13: qty 2

## 2019-10-13 MED ORDER — DOCUSATE SODIUM 100 MG PO CAPS
100.0000 mg | ORAL_CAPSULE | Freq: Two times a day (BID) | ORAL | Status: DC
  Administered 2019-10-14 – 2019-10-15 (×3): 100 mg via ORAL
  Filled 2019-10-13 (×3): qty 1

## 2019-10-13 MED ORDER — BUPIVACAINE LIPOSOME 1.3 % IJ SUSP
INTRAMUSCULAR | Status: DC | PRN
  Administered 2019-10-13: 20 mL via PERINEURAL

## 2019-10-13 MED ORDER — LACTATED RINGERS IV SOLN
INTRAVENOUS | Status: DC
  Administered 2019-10-13: 16:00:00 via INTRAVENOUS

## 2019-10-13 MED ORDER — DOXAZOSIN MESYLATE 4 MG PO TABS
4.0000 mg | ORAL_TABLET | Freq: Every day | ORAL | Status: DC
  Administered 2019-10-13 – 2019-10-14 (×2): 4 mg via ORAL
  Filled 2019-10-13 (×3): qty 1

## 2019-10-13 MED ORDER — ONDANSETRON HCL 4 MG/2ML IJ SOLN: 4.0000 mg | Freq: Four times a day (QID) | INTRAMUSCULAR | Status: DC | PRN

## 2019-10-13 MED ORDER — EPHEDRINE SULFATE 50 MG/ML IJ SOLN
INTRAMUSCULAR | Status: AC
  Filled 2019-10-13: qty 1

## 2019-10-13 MED ORDER — SUGAMMADEX SODIUM 200 MG/2ML IV SOLN
INTRAVENOUS | Status: DC | PRN
  Administered 2019-10-13: 50 mg via INTRAVENOUS

## 2019-10-13 MED ORDER — ACETAMINOPHEN 500 MG PO TABS
1000.0000 mg | ORAL_TABLET | Freq: Four times a day (QID) | ORAL | Status: AC
  Administered 2019-10-13 – 2019-10-14 (×4): 1000 mg via ORAL
  Filled 2019-10-13 (×4): qty 2

## 2019-10-13 MED ORDER — CEFAZOLIN SODIUM-DEXTROSE 2-4 GM/100ML-% IV SOLN
INTRAVENOUS | Status: AC
  Filled 2019-10-13: qty 100

## 2019-10-13 MED ORDER — SUGAMMADEX SODIUM 200 MG/2ML IV SOLN
INTRAVENOUS | Status: AC
  Filled 2019-10-13: qty 2

## 2019-10-13 MED ORDER — METHOCARBAMOL 500 MG PO TABS: 500.0000 mg | ORAL_TABLET | Freq: Four times a day (QID) | ORAL | Status: DC | PRN

## 2019-10-13 MED ORDER — CHLORHEXIDINE GLUCONATE 4 % EX LIQD: 60.0000 mL | Freq: Once | CUTANEOUS | Status: DC

## 2019-10-13 MED ORDER — KETOROLAC TROMETHAMINE 15 MG/ML IJ SOLN
15.0000 mg | Freq: Four times a day (QID) | INTRAMUSCULAR | Status: AC
  Administered 2019-10-13 – 2019-10-14 (×4): 15 mg via INTRAVENOUS
  Filled 2019-10-13 (×4): qty 1

## 2019-10-13 MED ORDER — SODIUM CHLORIDE 0.9 % IR SOLN
Status: DC | PRN
  Administered 2019-10-13: 12:00:00

## 2019-10-13 MED ORDER — BUPIVACAINE HCL (PF) 0.5 % IJ SOLN
INTRAMUSCULAR | Status: AC
  Filled 2019-10-13: qty 10

## 2019-10-13 MED ORDER — ACETAMINOPHEN 325 MG PO TABS
325.0000 mg | ORAL_TABLET | Freq: Four times a day (QID) | ORAL | Status: DC | PRN
  Administered 2019-10-15: 650 mg via ORAL
  Filled 2019-10-13: qty 2

## 2019-10-13 MED ORDER — KETOROLAC TROMETHAMINE 15 MG/ML IJ SOLN
INTRAMUSCULAR | Status: AC
  Administered 2019-10-13: 15 mg via INTRAVENOUS
  Filled 2019-10-13: qty 1

## 2019-10-13 MED ORDER — TRANEXAMIC ACID-NACL 1000-0.7 MG/100ML-% IV SOLN
1000.0000 mg | INTRAVENOUS | Status: AC
  Administered 2019-10-13: 1000 mg via INTRAVENOUS
  Filled 2019-10-13: qty 100

## 2019-10-13 MED ORDER — METFORMIN HCL 500 MG PO TABS
500.0000 mg | ORAL_TABLET | Freq: Two times a day (BID) | ORAL | Status: DC
  Administered 2019-10-13 – 2019-10-15 (×4): 500 mg via ORAL
  Filled 2019-10-13 (×4): qty 1

## 2019-10-13 MED ORDER — CEFAZOLIN SODIUM-DEXTROSE 2-4 GM/100ML-% IV SOLN
2.0000 g | Freq: Four times a day (QID) | INTRAVENOUS | Status: AC
  Administered 2019-10-13 – 2019-10-14 (×3): 2 g via INTRAVENOUS
  Filled 2019-10-13 (×3): qty 100

## 2019-10-13 MED ORDER — LIDOCAINE HCL (PF) 1 % IJ SOLN
INTRAMUSCULAR | Status: DC | PRN
  Administered 2019-10-13: 5 mL via SUBCUTANEOUS

## 2019-10-13 MED ORDER — MAGNESIUM HYDROXIDE 400 MG/5ML PO SUSP: 30.0000 mL | Freq: Every day | ORAL | Status: DC | PRN

## 2019-10-13 MED ORDER — OXYCODONE HCL ER 10 MG PO T12A
10.0000 mg | EXTENDED_RELEASE_TABLET | Freq: Two times a day (BID) | ORAL | Status: DC
  Administered 2019-10-13 – 2019-10-14 (×3): 10 mg via ORAL
  Filled 2019-10-13 (×3): qty 1

## 2019-10-13 MED ORDER — ASPIRIN EC 81 MG PO TBEC
162.0000 mg | DELAYED_RELEASE_TABLET | Freq: Every day | ORAL | Status: DC
  Administered 2019-10-13 – 2019-10-15 (×3): 162 mg via ORAL
  Filled 2019-10-13 (×3): qty 2

## 2019-10-13 MED ORDER — MIDAZOLAM HCL 2 MG/2ML IJ SOLN
INTRAMUSCULAR | Status: AC
  Administered 2019-10-13: 1 mg via INTRAVENOUS
  Filled 2019-10-13: qty 2

## 2019-10-13 MED ORDER — METOCLOPRAMIDE HCL 5 MG/ML IJ SOLN: 5.0000 mg | Freq: Three times a day (TID) | INTRAMUSCULAR | Status: DC | PRN

## 2019-10-13 MED ORDER — FENTANYL CITRATE (PF) 100 MCG/2ML IJ SOLN
INTRAMUSCULAR | Status: AC
  Administered 2019-10-13: 50 ug via INTRAVENOUS
  Filled 2019-10-13: qty 2

## 2019-10-13 MED ORDER — ROCURONIUM BROMIDE 50 MG/5ML IV SOLN
INTRAVENOUS | Status: AC
  Filled 2019-10-13: qty 1

## 2019-10-13 MED ORDER — ATORVASTATIN CALCIUM 10 MG PO TABS
10.0000 mg | ORAL_TABLET | ORAL | Status: DC
  Administered 2019-10-14 – 2019-10-15 (×2): 10 mg via ORAL
  Filled 2019-10-13 (×2): qty 1

## 2019-10-13 MED ORDER — EPINEPHRINE PF 1 MG/ML IJ SOLN
INTRAMUSCULAR | Status: AC
  Filled 2019-10-13: qty 1

## 2019-10-13 MED ORDER — PHENYLEPHRINE HCL (PRESSORS) 10 MG/ML IV SOLN
INTRAVENOUS | Status: DC | PRN
  Administered 2019-10-13 (×2): 50 ug via INTRAVENOUS

## 2019-10-13 MED ORDER — METOCLOPRAMIDE HCL 10 MG PO TABS: 5.0000 mg | ORAL_TABLET | Freq: Three times a day (TID) | ORAL | Status: DC | PRN

## 2019-10-13 MED ORDER — GLYCOPYRROLATE 0.2 MG/ML IJ SOLN
INTRAMUSCULAR | Status: DC | PRN
  Administered 2019-10-13: 0.2 mg via INTRAVENOUS

## 2019-10-13 MED ORDER — EPHEDRINE SULFATE 50 MG/ML IJ SOLN
INTRAMUSCULAR | Status: DC | PRN
  Administered 2019-10-13 (×6): 10 mg via INTRAVENOUS
  Administered 2019-10-13: 5 mg via INTRAVENOUS
  Administered 2019-10-13: 10 mg via INTRAVENOUS

## 2019-10-13 MED ORDER — METOPROLOL SUCCINATE ER 25 MG PO TB24
100.0000 mg | ORAL_TABLET | ORAL | Status: DC
  Administered 2019-10-14 – 2019-10-15 (×2): 100 mg via ORAL
  Filled 2019-10-13 (×2): qty 4

## 2019-10-13 MED ORDER — FAMOTIDINE 20 MG PO TABS
ORAL_TABLET | ORAL | Status: AC
  Filled 2019-10-13: qty 1

## 2019-10-13 MED ORDER — BISACODYL 10 MG RE SUPP: 10.0000 mg | Freq: Every day | RECTAL | Status: DC | PRN

## 2019-10-13 MED ORDER — LIDOCAINE HCL (PF) 2 % IJ SOLN
INTRAMUSCULAR | Status: AC
  Filled 2019-10-13: qty 10

## 2019-10-13 MED ORDER — FAMOTIDINE 20 MG PO TABS
20.0000 mg | ORAL_TABLET | Freq: Once | ORAL | Status: AC
  Administered 2019-10-13: 20 mg via ORAL

## 2019-10-13 MED ORDER — FENTANYL CITRATE (PF) 100 MCG/2ML IJ SOLN: 25.0000 ug | INTRAMUSCULAR | Status: DC | PRN

## 2019-10-13 MED ORDER — BUPIVACAINE LIPOSOME 1.3 % IJ SUSP
INTRAMUSCULAR | Status: AC
  Filled 2019-10-13: qty 20

## 2019-10-13 MED ORDER — PROPOFOL 10 MG/ML IV BOLUS
INTRAVENOUS | Status: DC | PRN
  Administered 2019-10-13: 30 mg via INTRAVENOUS
  Administered 2019-10-13: 140 mg via INTRAVENOUS

## 2019-10-13 MED ORDER — CEFAZOLIN SODIUM-DEXTROSE 2-4 GM/100ML-% IV SOLN
2.0000 g | INTRAVENOUS | Status: AC
  Administered 2019-10-13: 2 g via INTRAVENOUS

## 2019-10-13 MED ORDER — ASPIRIN 81 MG PO TABS: 162.0000 mg | ORAL_TABLET | Freq: Every day | ORAL | Status: DC

## 2019-10-13 MED ORDER — BUPIVACAINE HCL (PF) 0.5 % IJ SOLN
INTRAMUSCULAR | Status: DC | PRN
  Administered 2019-10-13: 10 mL via PERINEURAL

## 2019-10-13 MED ORDER — LIDOCAINE HCL (PF) 1 % IJ SOLN
INTRAMUSCULAR | Status: AC
  Filled 2019-10-13: qty 5

## 2019-10-13 MED ORDER — SODIUM CHLORIDE FLUSH 0.9 % IV SOLN
INTRAVENOUS | Status: AC
  Filled 2019-10-13: qty 20

## 2019-10-13 MED ORDER — MIDAZOLAM HCL 2 MG/2ML IJ SOLN
1.0000 mg | Freq: Once | INTRAMUSCULAR | Status: AC
  Administered 2019-10-13: 10:00:00 1 mg via INTRAVENOUS

## 2019-10-13 MED ORDER — ONDANSETRON HCL 4 MG PO TABS: 4.0000 mg | ORAL_TABLET | Freq: Four times a day (QID) | ORAL | Status: DC | PRN

## 2019-10-13 MED ORDER — ONDANSETRON HCL 4 MG/2ML IJ SOLN: 4.0000 mg | Freq: Once | INTRAMUSCULAR | Status: DC | PRN

## 2019-10-13 MED ORDER — SUCCINYLCHOLINE CHLORIDE 20 MG/ML IJ SOLN
INTRAMUSCULAR | Status: AC
  Filled 2019-10-13: qty 1

## 2019-10-13 MED ORDER — OXYCODONE HCL 5 MG PO TABS
10.0000 mg | ORAL_TABLET | ORAL | Status: DC | PRN
  Administered 2019-10-14: 10 mg via ORAL
  Administered 2019-10-14: 15 mg via ORAL
  Filled 2019-10-13: qty 3
  Filled 2019-10-13: qty 2

## 2019-10-13 MED ORDER — ONDANSETRON HCL 4 MG/2ML IJ SOLN
INTRAMUSCULAR | Status: DC | PRN
  Administered 2019-10-13: 4 mg via INTRAVENOUS

## 2019-10-13 MED ORDER — DEXAMETHASONE SODIUM PHOSPHATE 10 MG/ML IJ SOLN
INTRAMUSCULAR | Status: DC | PRN
  Administered 2019-10-13: 10 mg via INTRAVENOUS

## 2019-10-13 MED ORDER — MAGNESIUM CITRATE PO SOLN
1.0000 | Freq: Once | ORAL | Status: DC | PRN
  Filled 2019-10-13: qty 296

## 2019-10-13 MED ORDER — BUPIVACAINE HCL (PF) 0.25 % IJ SOLN
INTRAMUSCULAR | Status: AC
  Filled 2019-10-13: qty 30

## 2019-10-13 MED ORDER — FENTANYL CITRATE (PF) 100 MCG/2ML IJ SOLN
INTRAMUSCULAR | Status: DC | PRN
  Administered 2019-10-13: 50 ug via INTRAVENOUS

## 2019-10-13 MED ORDER — BUPIVACAINE-EPINEPHRINE (PF) 0.25% -1:200000 IJ SOLN
INTRAMUSCULAR | Status: DC | PRN
  Administered 2019-10-13: 23 mL

## 2019-10-13 MED ORDER — PHENOL 1.4 % MT LIQD
1.0000 | OROMUCOSAL | Status: DC | PRN
  Filled 2019-10-13: qty 177

## 2019-10-13 SURGICAL SUPPLY — 72 items
BLADE BOVIE TIP EXT 4 (BLADE) ×3 IMPLANT
BLADE SAW 1 (BLADE) ×3 IMPLANT
BLADE SAW 90X13X1.19 OSCILLAT (BLADE) IMPLANT
BLADE SAW 90X25X1.19 OSCILLAT (BLADE) ×2 IMPLANT
BODY ANATOMIC PROXIMAL SZ10 (Shoulder) ×2 IMPLANT
BOWL CEMENT MIX W/ADAPTER (MISCELLANEOUS) ×3 IMPLANT
BRUSH SCRUB EZ  4% CHG (MISCELLANEOUS) ×4
BRUSH SCRUB EZ 4% CHG (MISCELLANEOUS) ×2 IMPLANT
CANISTER SUCT 1200ML W/VALVE (MISCELLANEOUS) ×3 IMPLANT
CANISTER SUCT 3000ML PPV (MISCELLANEOUS) ×6 IMPLANT
CEMENT BONE 1-PACK (Cement) ×3 IMPLANT
CHLORAPREP W/TINT 26 (MISCELLANEOUS) ×6 IMPLANT
COVER BACK TABLE REUSABLE LG (DRAPES) ×3 IMPLANT
COVER WAND RF STERILE (DRAPES) ×3 IMPLANT
CRADLE LAMINECT ARM (MISCELLANEOUS) ×6 IMPLANT
DRAPE 3/4 80X56 (DRAPES) ×6 IMPLANT
DRAPE INCISE IOBAN 66X60 STRL (DRAPES) ×6 IMPLANT
DRAPE SPLIT 6X30 W/TAPE (DRAPES) ×6 IMPLANT
DRAPE U-SHAPE 47X51 STRL (DRAPES) ×9 IMPLANT
DRSG TEGADERM 6X8 (GAUZE/BANDAGES/DRESSINGS) ×3 IMPLANT
ELECT REM PT RETURN 9FT ADLT (ELECTROSURGICAL) ×3
ELECTRODE REM PT RTRN 9FT ADLT (ELECTROSURGICAL) ×1 IMPLANT
GAUZE SPONGE 4X4 12PLY STRL (GAUZE/BANDAGES/DRESSINGS) IMPLANT
GAUZE XEROFORM 1X8 LF (GAUZE/BANDAGES/DRESSINGS) ×3 IMPLANT
GLENOID ANCHOR PEG CROSSLK 48 (Orthopedic Implant) ×2 IMPLANT
GLOVE BIOGEL PI IND STRL 8.5 (GLOVE) IMPLANT
GLOVE BIOGEL PI INDICATOR 8.5 (GLOVE) ×2
GLOVE INDICATOR 8.0 STRL GRN (GLOVE) ×3 IMPLANT
GLOVE SURG ORTHO 8.0 STRL STRW (GLOVE) ×3 IMPLANT
GOWN STRL REUS W/ TWL LRG LVL3 (GOWN DISPOSABLE) ×1 IMPLANT
GOWN STRL REUS W/ TWL XL LVL3 (GOWN DISPOSABLE) ×1 IMPLANT
GOWN STRL REUS W/TWL LRG LVL3 (GOWN DISPOSABLE) ×6
GOWN STRL REUS W/TWL XL LVL3 (GOWN DISPOSABLE) ×2
HANDLE YANKAUER SUCT BULB TIP (MISCELLANEOUS) ×2 IMPLANT
HEAD HUMERAL ECC 44X18MM SHLDR (Head) ×2 IMPLANT
HOOD PEEL AWAY FLYTE STAYCOOL (MISCELLANEOUS) ×9 IMPLANT
IV NS 1000ML (IV SOLUTION) ×2
IV NS 1000ML BAXH (IV SOLUTION) ×1 IMPLANT
KIT STABILIZATION SHOULDER (MISCELLANEOUS) ×1 IMPLANT
KIT TURNOVER KIT A (KITS) ×3 IMPLANT
MASK FACE SPIDER DISP (MASK) ×3 IMPLANT
MAT ABSORB  FLUID 56X50 GRAY (MISCELLANEOUS) ×2
MAT ABSORB FLUID 56X50 GRAY (MISCELLANEOUS) ×1 IMPLANT
NDL FILTER BLUNT 18X1 1/2 (NEEDLE) IMPLANT
NDL REVERSE CUT 1/2 CRC (NEEDLE) ×1 IMPLANT
NDL SAFETY ECLIPSE 18X1.5 (NEEDLE) IMPLANT
NEEDLE FILTER BLUNT 18X 1/2SAF (NEEDLE) ×6
NEEDLE FILTER BLUNT 18X1 1/2 (NEEDLE) ×3 IMPLANT
NEEDLE HYPO 18GX1.5 SHARP (NEEDLE)
NEEDLE REVERSE CUT 1/2 CRC (NEEDLE) ×3 IMPLANT
NS IRRIG 1000ML POUR BTL (IV SOLUTION) ×3 IMPLANT
PACK ARTHROSCOPY SHOULDER (MISCELLANEOUS) ×3 IMPLANT
PAD ABD DERMACEA PRESS 5X9 (GAUZE/BANDAGES/DRESSINGS) ×4 IMPLANT
PIN METAGLENE 2.5 (PIN) ×2 IMPLANT
PULSAVAC PLUS IRRIG FAN TIP (DISPOSABLE) ×3
SLING ARM LRG DEEP (SOFTGOODS) ×3 IMPLANT
SPONGE LAP 18X18 RF (DISPOSABLE) ×5 IMPLANT
STAPLER SKIN PROX 35W (STAPLE) ×3 IMPLANT
STEM STANDARD SZ 10 113MM (Stem) ×3 IMPLANT
STEM STD SZ 10 113MM (Stem) IMPLANT
STRAP SAFETY 5IN WIDE (MISCELLANEOUS) ×3 IMPLANT
SUT TICRON 2-0 30IN 311381 (SUTURE) IMPLANT
SUT VIC AB 0 CT2 27 (SUTURE) ×3 IMPLANT
SUT VIC AB 2-0 CT1 18 (SUTURE) ×3 IMPLANT
SUT VIC AB PLUS 45CM 1-MO-4 (SUTURE) IMPLANT
SYR 10ML LL (SYRINGE) ×3 IMPLANT
SYR TB 1ML 27GX1/2 LL (SYRINGE) ×2 IMPLANT
SYRINGE IRR TOOMEY STRL 70CC (SYRINGE) ×3 IMPLANT
TAPE MICROFOAM 4IN (TAPE) ×2 IMPLANT
TAPE SUT 30 1/2 CRC GREEN (SUTURE) ×6 IMPLANT
TIP FAN IRRIG PULSAVAC PLUS (DISPOSABLE) ×1 IMPLANT
WATER STERILE IRR 1000ML POUR (IV SOLUTION) ×4 IMPLANT

## 2019-10-13 NOTE — Anesthesia Procedure Notes (Signed)
Procedure Name: Intubation Date/Time: 10/13/2019 11:12 AM Performed by: Jonna Clark, CRNA Pre-anesthesia Checklist: Patient identified, Patient being monitored, Timeout performed, Emergency Drugs available and Suction available Patient Re-evaluated:Patient Re-evaluated prior to induction Oxygen Delivery Method: Circle system utilized Preoxygenation: Pre-oxygenation with 100% oxygen Induction Type: IV induction Ventilation: Mask ventilation without difficulty Laryngoscope Size: McGraph and 4 Grade View: Grade I Tube type: Oral Tube size: 7.5 mm Number of attempts: 1 Airway Equipment and Method: Stylet Placement Confirmation: ETT inserted through vocal cords under direct vision,  positive ETCO2 and breath sounds checked- equal and bilateral Secured at: 23 cm Tube secured with: Tape Dental Injury: Teeth and Oropharynx as per pre-operative assessment

## 2019-10-13 NOTE — H&P (Signed)
The patient has been re-examined, and the chart reviewed, and there have been no interval changes to the documented history and physical.  Plan a right total shoulder replacement today.  Anesthesia is consulted regarding a peripheral nerve block for post-operative pain.  The risks, benefits, and alternatives have been discussed at length, and the patient is willing to proceed.

## 2019-10-13 NOTE — Anesthesia Procedure Notes (Signed)
Anesthesia Regional Block: Interscalene brachial plexus block   Pre-Anesthetic Checklist: ,, timeout performed, Correct Patient, Correct Site, Correct Laterality, Correct Procedure, Correct Position, site marked, Risks and benefits discussed,  Surgical consent,  Pre-op evaluation,  At surgeon's request and post-op pain management  Laterality: Right and Upper  Prep: chloraprep       Needles:  Injection technique: Single-shot  Needle Type: Stimiplex     Needle Length: 5cm  Needle Gauge: 22     Additional Needles:   Procedures:,,,, ultrasound used (permanent image in chart),,,,  Narrative:  Start time: 10/13/2019 10:08 AM End time: 10/13/2019 10:13 AM Injection made incrementally with aspirations every 5 mL.  Performed by: Personally  Anesthesiologist: Martha Clan, MD  Additional Notes: Functioning IV was confirmed and monitors were applied.  A 59mm 22ga Stimuplex needle was used. Sterile prep and drape,hand hygiene and sterile gloves were used.  Negative aspiration and negative test dose prior to incremental administration of local anesthetic. The patient tolerated the procedure well.

## 2019-10-13 NOTE — Transfer of Care (Signed)
Immediate Anesthesia Transfer of Care Note  Patient: Herbert Chapman  Procedure(s) Performed: TOTAL SHOULDER ARTHROPLASTY (Right Shoulder)  Patient Location: PACU  Anesthesia Type:General  Level of Consciousness: sedated  Airway & Oxygen Therapy: Patient Spontanous Breathing and Patient connected to face mask oxygen  Post-op Assessment: Report given to RN and Post -op Vital signs reviewed and stable  Post vital signs: Reviewed and stable  Last Vitals:  Vitals Value Taken Time  BP 161/67 10/13/19 1406  Temp 36.2 C 10/13/19 1406  Pulse 56 10/13/19 1407  Resp 15 10/13/19 1407  SpO2 100 % 10/13/19 1407  Vitals shown include unvalidated device data.  Last Pain:  Vitals:   10/13/19 0929  TempSrc: Temporal  PainSc: 2          Complications: No apparent anesthesia complications

## 2019-10-13 NOTE — Op Note (Signed)
10/13/2019  2:22 PM  Patient:   Herbert Chapman  Pre-Op Diagnosis:   Degenerative joint disease, right shoulder.  Post-Op Diagnosis:   Same.  Procedure:   Right total shoulder arthroplasty.  Surgeon:   Kurtis Bushman, MD  Assistant:  Carlynn Spry, PA-C  Anesthesia:   General endotracheal intubation with an interscalene block.  Findings:   As above. The rotator cuff was in satisfactory condition.  Complications:   None  EBL:  200 cc  Drains:   None  Closure:   Staples  Implants:   [employer] Unite system with a standard stem size 10 mm  With a 135 degree porocoat proximal body size 10, a 44 mm x 18 mm humeral head, and a 48 mm cemented glenoid component.  Brief Clinical Note:   The patient is a 73 year old man with chronic and severe right shoulder pain. The patient presents at this time for a right total shoulder arthroplasty.  Procedure:   The patient was brought into the operating room and lain in the supine position on the OR table. After adequate IV sedation was achieved, an interscalene block was placed by the anesthesiologist. The patient then underwent general endotracheal intubation and anesthesia before being repositioned in the beach chair position using the beach chair positioner. A Foley catheter was placed by the nurse. The right shoulder and upper extremity were prepped with ChloraPrep solution before being draped sterilely. Preoperative antibiotics were administered. A standard anterior approach to the shoulder was made through an approximately 4-5 inch incision. The incision was carried down through the subcutaneous tissues to expose the deltopectoral fascia. The interval between the deltoid and pectoralis muscles was identified and this plane developed, retracting the cephalic vein laterally with the deltoid muscle. The conjoined tendon was identified. The lateral margin was dissected and the Kolbel self-retraining retractor inserted. The "three sisters" were identified  and cauterized. Bursal tissues were removed to improve visualization. The subscapularis tendon was released from its attachment to the lesser tuberosity 1 cm proximal to its insertion and several tagging sutures placed. The inferior capsule was released with care after identifying and protecting the axillary nerve. The proximal humeral cut was made at approximately 30 of retroversion using the extra-medullary guide.   Attention was directed to the glenoid. The labrum was debrided circumferentially before the center of the glenoid was marked with electrocautery. The small and medium sizers were positioned and it was elected to proceed with a small glenoid component. The guidewire was drilled into the glenoid neck using the appropriate guide. After verifying its position, the glenoid was lightly reamed with the butterfly reamer before the centralizing reamer was used. The small peripheral peg guide was positioned and each of the three pegs drilled sequentially, leaving a peg in place so as to minimize shifting of the guide. The bony surfaces were prepared for cementing by irrigating them thoroughly with bacitracin saline solution using the jet lavage system. Meanwhile, cement was mixed on the back table. When it was ready, some cement was injected into each of the three peg holes using a Toomey syringe and additional cement applied to the posterior aspect of the glenoid component. The component was impacted into place and the excess cement was removed. Pressure was maintained on the glenoid until the cement hardened.  Attention was directed to the humeral side. The humeral canal was reamed sequentially beginning with the end-cutting reamer then progressing up to a 10 mm reamer. This provided excellent circumferential chatter. The canal  was prepared using a brosteotome. A trial reduction performed. The arm demonstrated excellent range of motion as the hand could be brought across the chest to the opposite shoulder  and brought to the top of the patient's head and to the patient's ear. The shoulder remained stable throughout this range of motion, and was stable with abduction and external rotation. The joint was dislocated and the trial components removed. The final components were impacted into place with care taken to maintain the appropriate version. Again, the [facility] taper locking mechanism was verified using manual distraction. The shoulder was relocated and again placed through a range of motion with the findings as described above.  The wound was copiously irrigated with bacitracin saline solution using the jet lavage system. The subscapularis tendon was reapproximated using 41mm cottony Dacron sutures. The deltopectoral interval was closed using #0 Vicryl interrupted sutures before the subcutaneous tissues were closed using 2-0 Vicryl interrupted sutures. The skin was closed using staples. A sterile occlusive dressing was applied to the wound before the arm was placed into a sling. The patient was then transferred back to a hospital bed before being awakened, extubated, and returned to the recovery room in satisfactory condition after tolerating the procedure well.

## 2019-10-13 NOTE — Anesthesia Post-op Follow-up Note (Signed)
Anesthesia QCDR form completed.        

## 2019-10-13 NOTE — Progress Notes (Signed)
Alert and oriented male admitted to unit with right arm in sling. Patient is alert and oriented.   Patient has not c/o pain since admission.  PO meds taken without difficulty.  Patient up to side of bed with assistance.

## 2019-10-13 NOTE — Anesthesia Preprocedure Evaluation (Signed)
Anesthesia Evaluation  Patient identified by MRN, date of birth, ID band Patient awake    Reviewed: Allergy & Precautions, H&P , NPO status , Patient's Chart, lab work & pertinent test results, reviewed documented beta blocker date and time   History of Anesthesia Complications Negative for: history of anesthetic complications  Airway Mallampati: I  TM Distance: >3 FB Neck ROM: full    Dental  (+) Dental Advidsory Given, Missing   Pulmonary neg pulmonary ROS, former smoker,    Pulmonary exam normal        Cardiovascular Exercise Tolerance: Good hypertension, (-) angina(-) CAD, (-) Past MI and (-) Cardiac Stents Normal cardiovascular exam(-) dysrhythmias (-) Valvular Problems/Murmurs     Neuro/Psych PSYCHIATRIC DISORDERS Anxiety Depression negative neurological ROS     GI/Hepatic GERD  ,(+) Hepatitis - (s/p treatment), C  Endo/Other  diabetes  Renal/GU CRFRenal disease  negative genitourinary   Musculoskeletal   Abdominal   Peds  Hematology negative hematology ROS (+)   Anesthesia Other Findings Past Medical History: No date: Anxiety No date: Arthritis No date: Cancer ([place])     Comment:  squamous cell CA removed from skin No date: Depression No date: Diabetes mellitus without complication (HCC) No date: GERD (gastroesophageal reflux disease)     Comment:  rare-no meds No date: Headache     Comment:  h/o migraines No date: Hepatitis     Comment:  Hep C-pt had injections and pill and pt states he does               not have hep c anymore No date: History of kidney stones     Comment:  h/o age 73 No date: Hypertension No date: Mental disorder 2012: Pancreatitis 11/07/2015: Personal history of tobacco use, presenting hazards to  health   Reproductive/Obstetrics negative OB ROS                             Anesthesia Physical Anesthesia Plan  ASA: III  Anesthesia Plan: General    Post-op Pain Management:  Regional for Post-op pain   Induction: Intravenous  PONV Risk Score and Plan: Ondansetron, Dexamethasone and Treatment may vary due to age or medical condition  Airway Management Planned: Oral ETT  Additional Equipment:   Intra-op Plan:   Post-operative Plan: Extubation in OR  Informed Consent: I have reviewed the patients History and Physical, chart, labs and discussed the procedure including the risks, benefits and alternatives for the proposed anesthesia with the patient or authorized representative who has indicated his/her understanding and acceptance.     Dental Advisory Given  Plan Discussed with: Anesthesiologist, CRNA and Surgeon  Anesthesia Plan Comments:         Anesthesia Quick Evaluation

## 2019-10-14 ENCOUNTER — Encounter: Payer: Self-pay | Admitting: Orthopedic Surgery

## 2019-10-14 LAB — ABO/RH: ABO/RH(D): O NEG

## 2019-10-14 MED ORDER — OXYCODONE-ACETAMINOPHEN 10-325 MG PO TABS: 1.0000 | ORAL_TABLET | Freq: Four times a day (QID) | ORAL | 0 refills | Status: DC | PRN

## 2019-10-14 NOTE — Progress Notes (Signed)
  Subjective:  Patient reports pain as mild.  Objective:   VITALS:   Vitals:   10/13/19 2335 10/14/19 0500 10/14/19 0746 10/14/19 1341  BP: 140/78 (!) 146/81 (!) 156/75 111/69  Pulse: 81 80 70 (!) 58  Resp: 18 18 16 18   Temp: 98.7 F (37.1 C) 98.6 F (37 C) 98.7 F (37.1 C) 98.2 F (36.8 C)  TempSrc: Oral Oral Oral Oral  SpO2: 96% 96% 95% 92%  Weight:        PHYSICAL EXAM:  Incision: dressing C/D/I Compartment soft Able to perform grip and extend all digits. decreased sensation and motor function from elbow to shoulder  LABS  Results for orders placed or performed during the hospital encounter of 10/13/19 (from the past 24 hour(s))  Glucose, capillary     Status: Abnormal   Collection Time: 10/13/19  2:18 PM  Result Value Ref Range   Glucose-Capillary 146 (H) 70 - 99 mg/dL    [place] Or Nerve Block-image Only ([facility])  Result Date: 10/13/2019 There is no interpretation for this exam.  This order is for images obtained during a surgical procedure.  Please See "Surgeries" Tab for more information regarding the procedure.    Assessment/Plan: 1 Day Post-Op   Active Problems:   Status post total shoulder arthroplasty, right   Up with therapy today If he clears PT he may go home later today   Lovell Sheehan , MD 10/14/2019, 1:46 PM

## 2019-10-14 NOTE — TOC Initial Note (Signed)
Transition of Care [facility]) - Initial/Assessment Note    Patient Details  Name: Herbert Chapman MRN: [id number] Date of Birth: 15-Jun-1946  [facility] CM/SW Contact:    Su Hilt, RN Phone Number: 10/14/2019, 3:19 PM  Clinical Narrative:                 Met with the patient and his partner at the bedside He lives at home with his partner and his partner provides transportation and assistance when needed The patient needs a 3 in 1 at home, I notified Brad with [employer] Dr Harlow Mares office set up [facility] for [facility] PT and OT prior to surgery, I notified [place] of the patient being here and Potential DC The patient can afford his medication He is up to date with his PCP No additional needs at this time  Expected Discharge Plan: [place] Barriers to Discharge: Continued Medical Work up   Patient Goals and CMS Choice Patient states their goals for this hospitalization and ongoing recovery are:: go home      Expected Discharge Plan and Services Expected Discharge Plan: [place]   Discharge Planning Services: CM Consult   Living arrangements for the past 2 months: Single Family Home Expected Discharge Date: 10/14/19               DME Arranged: 3-N-1 DME Agency: [employer] Date DME Agency Contacted: 10/14/19 Time DME Agency Contacted: [phone] Representative spoke with at DME Agency: [place] Arranged: PT, OT [place] Agency: [facility] Date [place]: 10/14/19 Time [place]: 1519 Representative spoke with at [place]: [place]  Prior Living Arrangements/Services Living arrangements for the past 2 months: [place] with:: Significant Other Patient language and need for interpreter reviewed:: Yes Do you feel safe going back to the place where you live?: Yes      Need for Family Participation in Patient Care: No (Comment) Care giver support system in place?: Yes (comment) Current home services:  DME(single point cane, quad cane) Criminal Activity/Legal Involvement Pertinent to Current Situation/Hospitalization: No - Comment as needed  Activities of Daily Living Home Assistive Devices/Equipment: None ADL Screening (condition at time of admission) Patient's cognitive ability adequate to safely complete daily activities?: Yes Is the patient deaf or have difficulty hearing?: No Does the patient have difficulty seeing, even when wearing glasses/contacts?: No Does the patient have difficulty concentrating, remembering, or making decisions?: No Patient able to express need for assistance with ADLs?: No Does the patient have difficulty dressing or bathing?: No Independently performs ADLs?: Yes (appropriate for developmental age) Does the patient have difficulty walking or climbing stairs?: No Weakness of Legs: Right Weakness of Arms/Hands: None  Permission Sought/Granted   Permission granted to share information with : Yes, Verbal Permission Granted              Emotional Assessment Appearance:: Appears stated age Attitude/Demeanor/Rapport: Engaged Affect (typically observed): Appropriate   Alcohol / Substance Use: Not Applicable Psych Involvement: No (comment)  Admission diagnosis:  M19.011 Primary osteoarthritis right shoulder Patient Active Problem List   Diagnosis Date Noted  . Status post total shoulder arthroplasty, right 10/13/2019  . Depression 01/13/2017  . Hepatitis C 01/13/2017  . Hypertension 01/13/2017  . Squamous cell carcinoma 01/13/2017  . Type 2 diabetes mellitus ([place]) 01/13/2017  . Atherosclerosis 02/23/2016  . CKD (chronic kidney disease) stage 3, GFR 30-59 ml/min 02/23/2016  . Personal history of tobacco use,  presenting hazards to health 11/07/2015  . Benign non-nodular prostatic hyperplasia with lower urinary tract symptoms 03/16/2015  . Chronic back pain 03/16/2015   PCP:  Baxter Hire, MD Pharmacy:   [facility], [place] [address] Phone: [phone] Fax: [phone]     Social Determinants of Health (SDOH) Interventions    Readmission Risk Interventions No flowsheet data found.

## 2019-10-14 NOTE — Progress Notes (Signed)
Patient was ambulate with PT this afternoon. His sats did drop to around 85% with moderate ambulation. sats returned to 90's rest. Patient continues to be weak. PT is recommending that the patient stay one more night then discharge tomorrow. Dr Harlow Mares informed. The patient will stay the night

## 2019-10-14 NOTE — Progress Notes (Signed)
OT is recommending that the patient have a PT consult due to him being so unsteady while working with OT. Order received from Dr Harlow Mares for PT

## 2019-10-14 NOTE — Evaluation (Signed)
Physical Therapy Evaluation Patient Details Name: Herbert Chapman MRN: [id number] DOB: [birth year] Today's Date: 10/14/2019   History of Present Illness  73yo male pt POD1 s/p R TSA on 10/13/2019. PMHx includes anxiety, depression, DM, HTN, CRF, and per pt hx of 2 back surgeries.  Clinical Impression  Pt did reasonably well with mobility in bed, had some balance and safety concerns with standing.  Pt appears to still be somewhat lethargic (meds, post-sx, etc?) and was unable to tolerate prolonged ambulation (~50 then ~40 ft) with considerable subjective fatigue as well as O2 sats dropping to mid 80s.  Pt reliant on UEs, at baseline he uses the cane in the R, did better with QC than SPC in the L during today's effort.  Pt able to negotiate steps relatively well and confidently but is still generally not steady enough at this time to return home safely.      Follow Up Recommendations Home health PT    Equipment Recommendations  3in1 (PT)    Recommendations for Other Services       Precautions / Restrictions Precautions Precautions: Shoulder;Fall Type of Shoulder Precautions: no shoulder ROM, elbow/wrist/hand ROM ok, RUE NWBing Shoulder Interventions: Shoulder sling/immobilizer;At all times;Off for dressing/bathing/exercises Precaution Booklet Issued: Yes (comment) Restrictions Weight Bearing Restrictions: Yes RUE Weight Bearing: Non weight bearing      Mobility  Bed Mobility Overal bed mobility: Modified Independent Bed Mobility: Supine to Sit     Supine to sit: Supervision     General bed mobility comments: Pt was able to get himself to sitting EOB w/o direct assist, minimal extra cuing to initiate movement  Transfers Overall transfer level: Needs assistance Equipment used: Straight cane;Quad cane Transfers: Sit to/from Stand Sit to Stand: Min guard         General transfer comment: cuing to use L UE appropriately to maintain balance and access to cane - some initial  unsteadiness but no need for direct assist to maintain balance  Ambulation/Gait Ambulation/Gait assistance: Min guard Gait Distance (Feet): 55 Feet Assistive device: Straight cane;Quad cane       General Gait Details: Pt with slow, guarded gait.  Pt with multiple low grade unsteadiness that did not require direct assist to arrest, but is not his basline; brief trial with SPC and pt was more unsteady and subjectively reported feeling much better with QC.  Second bout of ambulation ~40 ft, pt quick to fatigue.  O2 sats by the end of the effort were in the mid 80s with pt feeling short of breath and generally fatigued.  Stairs Stairs: Yes Stairs assistance: Supervision;Min guard Stair Management: One rail Left Number of Stairs: 4 General stair comments: Pt was able to negotiate up/down steps w/o assist, reliant on UEs/rails  Wheelchair Mobility    Modified Rankin (Stroke Patients Only)       Balance Overall balance assessment: Needs assistance   Sitting balance-Leahy Scale: Good       Standing balance-Leahy Scale: Fair Standing balance comment: reliant on UEs, consistently unsteady with dynamic tasks, no LOBs                             Pertinent Vitals/Pain Pain Assessment: No/denies pain(still numb from block)    Home Living Family/patient expects to be discharged to:: Private residence Living Arrangements: Spouse/significant other Available Help at Discharge: Family;Available 24 hours/day Type of Home: Mobile home Home Access: Stairs to enter Entrance Stairs-Rails: Right;Left;Can reach both Entrance  Stairs-Number of Steps: 6-7 Home Layout: One level Home Equipment: Shower seat;Cane - single point;Cane - quad      Prior Function Level of Independence: Independent with assistive device(s)         Comments: Pt reports indep with ADL, recently using SPC for mobility for "safety" after having tripped over a curb, only 1 fall in past 12 months      Hand Dominance   Dominant Hand: Right    Extremity/Trunk Assessment   Upper Extremity Assessment Upper Extremity Assessment: Defer to OT evaluation    Lower Extremity Assessment Lower Extremity Assessment: Overall WFL for tasks assessed RLE Deficits / Details: minimally decreased R foot DF, able to functionally clear it during ambulation       Communication   Communication: No difficulties  Cognition Arousal/Alertness: Awake/alert Behavior During Therapy: WFL for tasks assessed/performed Overall Cognitive Status: Within Functional Limits for tasks assessed                                 General Comments: Pt able to participate but does appear back to his baseline with some confusion and slowness to respond      General Comments      Exercises     Assessment/Plan    PT Assessment Patient needs continued PT services  PT Problem List Decreased strength;Decreased range of motion;Decreased activity tolerance;Decreased balance;Decreased mobility;Decreased coordination;Decreased knowledge of use of DME;Decreased safety awareness;Decreased cognition;Pain;Decreased knowledge of precautions       PT Treatment Interventions DME instruction;Gait training;Stair training;Functional mobility training;Therapeutic activities;Therapeutic exercise;Balance training;Neuromuscular re-education;Cognitive remediation;Patient/family education    PT Goals (Current goals can be found in the Care Plan section)  Acute Rehab PT Goals Patient Stated Goal: get better and return home PT Goal Formulation: With patient Time For Goal Achievement: 10/28/19 Potential to Achieve Goals: Fair    Frequency BID   Barriers to discharge        Co-evaluation               AM-PAC PT "6 Clicks" Mobility  Outcome Measure Help needed turning from your back to your side while in a flat bed without using bedrails?: None Help needed moving from lying on your back to sitting on the  side of a flat bed without using bedrails?: None Help needed moving to and from a bed to a chair (including a wheelchair)?: A Little Help needed standing up from a chair using your arms (e.g., wheelchair or bedside chair)?: A Little Help needed to walk in hospital room?: A Little Help needed climbing 3-5 steps with a railing? : A Little 6 Click Score: 20    End of Session Equipment Utilized During Treatment: Gait belt Activity Tolerance: Patient limited by fatigue Patient left: with call bell/phone within reach;with family/visitor present Nurse Communication: Mobility status PT Visit Diagnosis: Muscle weakness (generalized) (M62.81);Difficulty in walking, not elsewhere classified (R26.2);Unsteadiness on feet (R26.81);Pain Pain - Right/Left: Left Pain - part of body: Shoulder    Time: [id number] PT Time Calculation (min) (ACUTE ONLY): 48 min   Charges:   PT Evaluation $PT Eval Low Complexity: 1 Low PT Treatments $Gait Training: 8-22 mins $Therapeutic Activity: 8-22 mins        Kreg Shropshire, DPT 10/14/2019, 5:29 PM

## 2019-10-14 NOTE — Discharge Summary (Signed)
Physician Discharge Summary  Patient ID: Herbert Chapman MRN: [id number] DOB/AGE: 74-Feb-1947 74 y.o.  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 Anxiety   . Arthritis   . Cancer ([place])    squamous cell CA removed from skin  . Depression   . Diabetes mellitus without complication ([place])   . GERD (gastroesophageal reflux disease)    rare-no meds  . Headache    h/o migraines  . Hepatitis    Hep C-pt had injections and pill and pt states he does not have hep c anymore  . History of kidney stones    h/o age 87  . Hypertension   . Mental disorder   . Pancreatitis 2012  . Personal history of tobacco use, presenting hazards to health 11/07/2015    Surgeries: Procedure(s): TOTAL SHOULDER ARTHROPLASTY on 10/13/2019   Consultants (if any):   Discharged Condition: Improved  Hospital Course: Herbert Chapman is an 73 y.o. 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    He was given perioperative antibiotics:  Anti-infectives (From admission, onward)   Start     Dose/Rate Route Frequency Ordered Stop   10/13/19 1530  ceFAZolin (ANCEF) IVPB 2g/100 mL premix     2 g 200 mL/hr over 30 Minutes Intravenous Every 6 hours 10/13/19 1517 10/14/19 0409   10/13/19 1143  50,000 units bacitracin in 0.9% normal saline 250 mL irrigation  Status:  Discontinued       As needed 10/13/19 1221 10/13/19 1401   10/13/19 1043  ceFAZolin (ANCEF) 2-4 GM/100ML-% IVPB    Note to Pharmacy: Herbert Chapman   : cabinet override      10/13/19 1043 10/13/19 1121    10/13/19 0600  ceFAZolin (ANCEF) IVPB 2g/100 mL premix     2 g 200 mL/hr over 30 Minutes Intravenous On call to O.R. 10/13/19 0126 10/13/19 1121    .  He was given sequential compression devices, early ambulation, and aspirin for DVT prophylaxis.  He benefited maximally from the hospital stay and there were no complications.    Recent vital signs:  Vitals:   10/14/19 0746 10/14/19 1341  BP: (!) 156/75 111/69  Pulse: 70 (!) 58  Resp: 16 18  Temp: 98.7 F (37.1 C) 98.2 F (36.8 C)  SpO2: 95% 92%    Recent laboratory studies:  Lab Results  Component Value Date   HGB 15.5 10/08/2019   HGB 17.2 11/30/2013   HGB 12.6 (L) 11/25/2012   Lab Results  Component Value Date   WBC 8.1 10/08/2019   PLT 167 10/08/2019   Lab Results  Component Value Date   INR 1.1 10/08/2019   Lab Results  Component Value Date   NA 138 10/08/2019   K 4.5 10/08/2019   CL 102 10/08/2019   CO2 27 10/08/2019   BUN 18 10/08/2019   CREATININE 1.09 10/08/2019   GLUCOSE 100 (H) 10/08/2019    Discharge Medications:   Allergies as of 10/14/2019   No Known Allergies     Medication List    TAKE these medications   aspirin 81 MG tablet Take 162 mg  by mouth daily.   atorvastatin 10 MG tablet Commonly known as: LIPITOR Take 10 mg by mouth every morning.   doxazosin 4 MG tablet Commonly known as: CARDURA Take 4 mg by mouth at bedtime.   FLUoxetine 20 MG capsule Commonly known as: PROZAC Take 60 mg by mouth every morning.   lisinopril 40 MG tablet Commonly known as: ZESTRIL Take 40 mg by mouth every morning.   meloxicam 15 MG tablet Commonly known as: MOBIC Take 15 mg by mouth daily.   metFORMIN 500 MG tablet Commonly known as: GLUCOPHAGE Take 500 mg by mouth 2 (two) times daily with a meal.   metoprolol succinate 100 MG 24 hr tablet Commonly known as: TOPROL-XL Take 100 mg by mouth every morning. Take with or immediately following a meal.   oxyCODONE 5 MG immediate release  tablet Commonly known as: Oxy IR/ROXICODONE Take 5 mg by mouth 5 (five) times daily.   oxyCODONE-acetaminophen 10-325 MG tablet Commonly known as: Percocet Take 1 tablet by mouth every 6 (six) hours as needed for pain.       Diagnostic Studies: Ct Shoulder Right Wo Contrast  Result Date: 09/23/2019 CLINICAL DATA:  Primary osteoarthritis of the right shoulder. Pre-surgical planning. EXAM: CT OF THE UPPER RIGHT EXTREMITY WITHOUT CONTRAST TECHNIQUE: Multidetector CT imaging of the upper right extremity was performed according to the standard protocol. COMPARISON:  CT scan dated 08/31/2019 FINDINGS: Bones/Joint/Cartilage There is severe osteoarthritis of the glenohumeral joint with diffuse full-thickness cartilage loss, marginal osteophyte formation on the humeral head and glenoid, multiple ossified loose bodies in the joint, a joint effusion, and reshaping of the glenoid due to the chronic arthritis. Muscles and Tendons Normal. Soft tissues Normal. IMPRESSION: Severe osteoarthritis of the glenohumeral joint as described. Electronically Signed   By: Lorriane Shire M.D.   On: 09/23/2019 08:05   [place] Or Nerve Block-image Only ([facility])  Result Date: 10/13/2019 There is no interpretation for this exam.  This order is for images obtained during a surgical procedure.  Please See "Surgeries" Tab for more information regarding the procedure.    Disposition:        Signed: Lovell Sheehan ,MD 10/14/2019, 1:52 PM

## 2019-10-14 NOTE — Plan of Care (Signed)
Pt pain controlled. Alert and oriented, sitting up in bed. Medication Education done and pt verbalized understanding. Pt also verbalizes self care of shoulder and beginning physical therapy. Pdowless, rn

## 2019-10-14 NOTE — Evaluation (Signed)
Occupational Therapy Evaluation Patient Details Name: Herbert Chapman MRN: [id number] DOB: 22-Oct-1946 Today's Date: 10/14/2019    History of Present Illness 73yo male pt POD1 s/p R TSA on 10/13/2019. PMHx includes anxiety, depression, DM, HTN, CRF, and per pt hx of 2 back surgeries.   Clinical Impression   Patient was seen for an OT evaluation this date. Pt lives with his partner in a mobile home with 6-7 steps and bilat rails. Prior to surgery, pt was generally independent with ADL and cooking but partner had to do most of the housekeeping tasks. Pt ambulating with SPC for "safety" after he fell tripping over a curb and pt reports hx of foot drop on RLE after his back surgeries. Pt able to perform PF/DF and able to get to neutral DF but does demonstrate impaired DF, increasing his falls risk. Pt has orders for dominant RUE to be immobilized and will be NWBing per MD. Patient presents with impaired strength/ROM, pain, and sensation to RUE with block not completely resolved yet. Pt also demo's poor balance with functional transfer training, requiring Min A to perform and cues to shift weight anteriorly to prevent posterior LOB. Pt also now must use SPC in L non-dom hand which he reports is more difficult than he imagined. These impairments result in a decreased ability to perform self care tasks requiring mod assist for UB/LB dressing and bathing and Min A for toilet transfers. Pt instructed in sling mgt, ROM exercises for RUE (with instructions for no shoulder exercises until full sensation has returned), RUE precautions, adaptive strategies for ADL, positioning and considerations for sleep, and home/routines modifications to maximize falls prevention, safety, and independence. Handout provided. OT adjusted sling to improve comfort, optimize positioning, and to maximize skin integrity/safety. Pt verbalized understanding of all education/training provided. Pt will benefit from skilled OT services to address  these limitations and improve independence in daily tasks. Recommend PT consult while hospitalized and HHOT services to continue therapy to maximize return to PLOF, address home/routines modifications and safety, minimize falls risk, and minimize caregiver burden. RN and RNCM notified.    Follow Up Recommendations  Home health OT    Equipment Recommendations  3 in 1 bedside commode;Other (comment)(reacher)    Recommendations for Other Services PT consult     Precautions / Restrictions Precautions Precautions: Shoulder;Fall Type of Shoulder Precautions: no shoulder ROM, elbow/wrist/hand ROM ok, RUE NWBing Shoulder Interventions: Shoulder sling/immobilizer;At all times;Off for dressing/bathing/exercises Precaution Booklet Issued: Yes (comment) Restrictions Weight Bearing Restrictions: Yes RUE Weight Bearing: Non weight bearing      Mobility Bed Mobility Overal bed mobility: Needs Assistance Bed Mobility: Supine to Sit;Sit to Supine     Supine to sit: Supervision Sit to supine: Supervision      Transfers Overall transfer level: Needs assistance Equipment used: Straight cane Transfers: Sit to/from Stand;Lateral/Scoot Transfers Sit to Stand: Min assist;Min guard        Lateral/Scoot Transfers: Min guard General transfer comment: initial attempt pt unable with significant posterior lean, with cues for foot placement, pt able to perform 2nd and 3rd times with CGA-Min A for balance    Balance Overall balance assessment: Needs assistance Sitting-balance support: No upper extremity supported;Feet supported Sitting balance-Leahy Scale: Good   Postural control: Posterior lean Standing balance support: Single extremity supported Standing balance-Leahy Scale: Poor Standing balance comment: significant posterior lean with attempts to stand; cues to put weight through toes with improved balance and weight shift anteriorly  ADL either  performed or assessed with clinical judgement   ADL Overall ADL's : Needs assistance/impaired Eating/Feeding: Modified independent   Grooming: Sitting;Minimal assistance Grooming Details (indicate cue type and reason): will require min A for R underarm washing, deo Upper Body Bathing: Sitting;Minimal assistance;Moderate assistance   Lower Body Bathing: Sit to/from stand;Moderate assistance   Upper Body Dressing : Sitting;Minimal assistance;Moderate assistance   Lower Body Dressing: Sit to/from stand;Moderate assistance   Toilet Transfer: Ambulation;BSC;Minimal assistance;Cueing for safety Toilet Transfer Details (indicate cue type and reason): using SPC in non-dom L hand                 Vision Baseline Vision/History: Wears glasses Wears Glasses: At all times Patient Visual Report: No change from baseline       Perception     Praxis      Pertinent Vitals/Pain Pain Assessment: 0-10 Pain Score: 1  Pain Location: R shoulder Pain Descriptors / Indicators: Aching Pain Intervention(s): Limited activity within patient's tolerance;Monitored during session;Repositioned;Premedicated before session     Hand Dominance Right   Extremity/Trunk Assessment Upper Extremity Assessment Upper Extremity Assessment: RUE deficits/detail RUE Deficits / Details: impaired ROM, sensation, strength, good grip strength RUE: Unable to fully assess due to immobilization RUE Sensation: decreased light touch;decreased proprioception RUE Coordination: decreased fine motor;decreased gross motor   Lower Extremity Assessment Lower Extremity Assessment: Overall WFL for tasks assessed;RLE deficits/detail RLE Deficits / Details: per pt hx of R foot drop; decreased DF noted but able to get to neutral   Cervical / Trunk Assessment Cervical / Trunk Assessment: Other exceptions Cervical / Trunk Exceptions: hx lumbar back surgeries, per pt   Communication Communication Communication: No  difficulties   Cognition Arousal/Alertness: Awake/alert Behavior During Therapy: WFL for tasks assessed/performed Overall Cognitive Status: Within Functional Limits for tasks assessed                                     General Comments       Exercises Other Exercises Other Exercises: Pt instructed in shoulder sling mgt, falls prevention, AE/DME, home/routines modifications, positioning of RUE in bed and hemi techniques for ADL; handout provided to support recall and carryover Other Exercises: Pt instructed in functional transfer training to improve balance and safety with functional mobility for ADL   Shoulder Instructions      Home Living Family/patient expects to be discharged to:: Private residence Living Arrangements: Spouse/significant other Available Help at Discharge: Family;Available 24 hours/day Type of Home: Mobile home Home Access: Stairs to enter Entrance Stairs-Number of Steps: 6-7 Entrance Stairs-Rails: Right;Left;Can reach both Home Layout: One level     Bathroom Shower/Tub: Corporate investment banker: Standard     Home Equipment: Shower seat;Cane - single point;Cane - quad          Prior Functioning/Environment Level of Independence: Independent with assistive device(s)        Comments: Pt reports indep with ADL, recently using SPC for mobility for "safety" after having tripped over a curb, only 1 fall in past 12 months        OT Problem List: Decreased strength;Decreased range of motion;Decreased knowledge of use of DME or AE;Impaired balance (sitting and/or standing);Impaired UE functional use;Decreased knowledge of precautions      OT Treatment/Interventions: Self-care/ADL training;Therapeutic exercise;Therapeutic activities;DME and/or AE instruction;Patient/family education;Balance training    OT Goals(Current goals can be found in the care plan section) Acute  Rehab OT Goals Patient Stated Goal: get better and  return home OT Goal Formulation: With patient Time For Goal Achievement: 10/28/19 Potential to Achieve Goals: Good ADL Goals Pt Will Perform Grooming: with caregiver independent in assisting Pt Will Perform Upper Body Dressing: with caregiver independent in assisting Pt Will Perform Lower Body Dressing: with caregiver independent in assisting Pt Will Transfer to Toilet: with supervision;ambulating;bedside commode;regular height toilet(LRAD for amb, comfort height toilet) Additional ADL Goal #1: Pt will perform transfers EOB for ADL tasks with supervisionand no LOB, LRAD to perform.  OT Frequency: Min 2X/week   Barriers to D/C:            Co-evaluation              AM-PAC OT "6 Clicks" Daily Activity     Outcome Measure Help from another person eating meals?: None Help from another person taking care of personal grooming?: A Little Help from another person toileting, which includes using toliet, bedpan, or urinal?: A Little Help from another person bathing (including washing, rinsing, drying)?: A Lot Help from another person to put on and taking off regular upper body clothing?: A Lot Help from another person to put on and taking off regular lower body clothing?: A Lot 6 Click Score: 16   End of Session Equipment Utilized During Treatment: Gait belt Nurse Communication: Other (comment)(need PT evaluation)  Activity Tolerance: Patient tolerated treatment well Patient left: in bed;with call bell/phone within reach;with bed alarm set;with SCD's reapplied  OT Visit Diagnosis: Other abnormalities of gait and mobility (R26.89)                Time: [id number] OT Time Calculation (min): 39 min Charges:  OT General Charges $OT Visit: 1 Visit OT Evaluation $OT Eval Moderate Complexity: 1 Mod OT Treatments $Self Care/Home Management : 8-22 mins $Therapeutic Activity: 8-22 mins  Jeni Salles, MPH, MS, OTR/L ascom [phone] 10/14/19, 11:09 AM

## 2019-10-14 NOTE — Discharge Instructions (Signed)
Wear sling at all times, including sleep.  You will need to use the sling for a total of 4 weeks following surgery.  Do not try and lift your arm up or away from your body for any reason.   Keep the dressing dry.  You may remove bandage in 3 days.  You may place Band-Aid over top of the incision.  May shower once dressing is removed in 3 days.  Remove sling carefully only for showers, leaving arm down by your side while in the shower.  +++ Make sure to take some pain medication this evening before you fall asleep, in preparation for the nerve block wearing off in the middle of the night.  If the the pain medication causes itching, or is too strong, try taking a single tablet at a time, or combining with Benadryl.  You may be most comfortable sleeping in a recliner.  If you do sleep in near bed, placed pillows behind the shoulder that have the operation to support it.

## 2019-10-14 NOTE — Anesthesia Postprocedure Evaluation (Signed)
Anesthesia Post Note  Patient: Herbert Chapman  Procedure(s) Performed: TOTAL SHOULDER ARTHROPLASTY (Right Shoulder)  Patient location during evaluation: PACU Anesthesia Type: General Level of consciousness: awake and alert Pain management: pain level controlled Vital Signs Assessment: post-procedure vital signs reviewed and stable Respiratory status: spontaneous breathing, nonlabored ventilation, respiratory function stable and patient connected to nasal cannula oxygen Cardiovascular status: blood pressure returned to baseline and stable Postop Assessment: no apparent nausea or vomiting Anesthetic complications: no     Last Vitals:  Vitals:   10/14/19 0500 10/14/19 0746  BP: (!) 146/81 (!) 156/75  Pulse: 80 70  Resp: 18 16  Temp: 37 C 37.1 C  SpO2: 96% 95%    Last Pain:  Vitals:   10/14/19 0747  TempSrc:   PainSc: 3                  Martha Clan

## 2019-10-15 ENCOUNTER — Inpatient Hospital Stay: Payer: Medicare Other

## 2019-10-15 LAB — SURGICAL PATHOLOGY

## 2019-10-15 MED ORDER — ALUM & MAG HYDROXIDE-SIMETH 200-200-20 MG/5ML PO SUSP
30.0000 mL | Freq: Once | ORAL | Status: AC
  Administered 2019-10-15: 30 mL via ORAL
  Filled 2019-10-15: qty 30

## 2019-10-15 NOTE — Progress Notes (Signed)
Physical Therapy Treatment Patient Details Name: Herbert Chapman MRN: [id number] DOB: Oct 26, 1946 Today's Date: 10/15/2019    History of Present Illness 73yo male s/p R TSA on 10/13/2019. PMHx includes anxiety, depression, DM, HTN, CRF, and per pt hx of 2 back surgeries.    PT Comments    Pt more alert today, but still appears to be somewhat effected by meds.  He was able to ambulate 125 ft and negotiate up/down steps but started to feel poorly, very tired, and needed to sit down and wheel back to his room.  Discussed with pt and spouse a lot about home set up, expectations, further precaution clarification, etc. Pt clearly more confident and better with ambulation today, however still needing supervision and showing cautious, slow cadence.  Follow Up Recommendations  Home health PT     Equipment Recommendations       Recommendations for Other Services       Precautions / Restrictions Precautions Precautions: Shoulder;Fall Type of Shoulder Precautions: no shoulder ROM, elbow/wrist/hand ROM ok, RUE NWBing Shoulder Interventions: Shoulder sling/immobilizer;At all times;Off for dressing/bathing/exercises Precaution Booklet Issued: Yes (comment) Restrictions Weight Bearing Restrictions: Yes RUE Weight Bearing: Non weight bearing    Mobility  Bed Mobility Overal bed mobility: Modified Independent Bed Mobility: Supine to Sit     Supine to sit: Supervision     General bed mobility comments: Pt made initial move toward EOB well, then needed to rock back and forth with L UE use able to to sitting w/o assist  Transfers Overall transfer level: Needs assistance Equipment used: Quad cane Transfers: Sit to/from Stand Sit to Stand: Min guard            Ambulation/Gait Ambulation/Gait assistance: Counsellor (Feet): 125 Feet Assistive device: Quad cane       General Gait Details: Pt again with cautious, guarded gait but was faster and more consistent than last  ambulation bout.  His vitals remained relatively stable but after ~125 ft (+steps) he began to fatigue and needed to sit.  O2 and HR were stable but he felt "lousy" and could not try to do any more.   Stairs Stairs: Yes Stairs assistance: Min guard Stair Management: One rail Left Number of Stairs: 4 General stair comments: Pt again did well on steps and did not need direct assist apart from cuing.     Wheelchair Mobility    Modified Rankin (Stroke Patients Only)       Balance Overall balance assessment: Needs assistance Sitting-balance support: No upper extremity supported;Feet supported Sitting balance-Leahy Scale: Good Sitting balance - Comments: good sitting balance                                    Cognition Arousal/Alertness: Awake/alert Behavior During Therapy: WFL for tasks assessed/performed Overall Cognitive Status: Within Functional Limits for tasks assessed                                 General Comments: Pt continues to have some mild confusion (meds) but was more clear than last night      Exercises Shoulder Exercises Shoulder Flexion: PROM;15 reps;20 reps(gradually increasing to ~80 deg) Shoulder ABduction: 15 reps;20 reps;PROM(gradual increased to ~70) Other Exercises Other Exercises: Pt and spouse instructed in shoulder sling mgt, falls prevention, AE/DME, home/routines modifications, positioning of RUE in bed and hemi  techniques for ADL; handout provided to support recall and carryover    General Comments        Pertinent Vitals/Pain Pain Assessment: 0-10 Pain Score: 6  Pain Location: R shoulder Pain Descriptors / Indicators: Aching Pain Intervention(s): Limited activity within patient's tolerance;Premedicated before session;Monitored during session;Repositioned    Home Living                      Prior Function            PT Goals (current goals can now be found in the care plan section) Acute Rehab  PT Goals Patient Stated Goal: get better and return home Progress towards PT goals: Progressing toward goals    Frequency    BID      PT Plan Current plan remains appropriate    Co-evaluation              AM-PAC PT "6 Clicks" Mobility   Outcome Measure  Help needed turning from your back to your side while in a flat bed without using bedrails?: None Help needed moving from lying on your back to sitting on the side of a flat bed without using bedrails?: None Help needed moving to and from a bed to a chair (including a wheelchair)?: A Little Help needed standing up from a chair using your arms (e.g., wheelchair or bedside chair)?: A Little Help needed to walk in hospital room?: A Little Help needed climbing 3-5 steps with a railing? : A Little 6 Click Score: 20    End of Session Equipment Utilized During Treatment: Gait belt Activity Tolerance: Patient limited by fatigue Patient left: with call bell/phone within reach;with family/visitor present Nurse Communication: Mobility status PT Visit Diagnosis: Muscle weakness (generalized) (M62.81);Difficulty in walking, not elsewhere classified (R26.2);Unsteadiness on feet (R26.81);Pain Pain - Right/Left: Left Pain - part of body: Shoulder     Time: 0950-1030 PT Time Calculation (min) (ACUTE ONLY): 40 min  Charges:  $Gait Training: 8-22 mins $Therapeutic Exercise: 8-22 mins $Therapeutic Activity: 8-22 mins                     Kreg Shropshire, DPT 10/15/2019, 1:28 PM

## 2019-10-15 NOTE — Discharge Summary (Signed)
Physician Discharge Summary  Patient ID: Herbert Chapman MRN: [id number] DOB/AGE: 1946/04/14 73 y.o.  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 Anxiety   . Arthritis   . Cancer ([place])    squamous cell CA removed from skin  . Depression   . Diabetes mellitus without complication ([place])   . GERD (gastroesophageal reflux disease)    rare-no meds  . Headache    h/o migraines  . Hepatitis    Hep C-pt had injections and pill and pt states he does not have hep c anymore  . History of kidney stones    h/o age 44  . Hypertension   . Mental disorder   . Pancreatitis 2012  . Personal history of tobacco use, presenting hazards to health 11/07/2015    Surgeries: Procedure(s): TOTAL SHOULDER ARTHROPLASTY on 10/13/2019   Consultants (if any):   Discharged Condition: Improved  Hospital Course: JATARIUS HARRIEL is an 73 y.o. 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    He was given perioperative antibiotics:  Anti-infectives (From admission, onward)   Start     Dose/Rate Route Frequency Ordered Stop   10/13/19 1530  ceFAZolin (ANCEF) IVPB 2g/100 mL premix     2 g 200 mL/hr over 30 Minutes Intravenous Every 6 hours 10/13/19 1517 10/14/19 0409   10/13/19 1143  50,000 units bacitracin in 0.9% normal saline 250 mL irrigation  Status:  Discontinued       As needed 10/13/19 1221 10/13/19 1401   10/13/19 1043  ceFAZolin (ANCEF) 2-4 GM/100ML-% IVPB    Note to Pharmacy: [name]   : cabinet override      10/13/19 1043 10/13/19 1121    10/13/19 0600  ceFAZolin (ANCEF) IVPB 2g/100 mL premix     2 g 200 mL/hr over 30 Minutes Intravenous On call to O.R. 10/13/19 0126 10/13/19 1121    .  He was given sequential compression devices, early ambulation, and Aspirin for DVT prophylaxis.  He benefited maximally from the hospital stay and there were no complications.    Recent vital signs:  Vitals:   10/14/19 2106 10/15/19 0105  BP: (!) 151/71 129/87  Pulse: 62 70  Resp: 16 18  Temp: 98.1 F (36.7 C) 98.5 F (36.9 C)  SpO2: 94% 92%    Recent laboratory studies:  Lab Results  Component Value Date   HGB 15.5 10/08/2019   HGB 17.2 11/30/2013   HGB 12.6 (L) 11/25/2012   Lab Results  Component Value Date   WBC 8.1 10/08/2019   PLT 167 10/08/2019   Lab Results  Component Value Date   INR 1.1 10/08/2019   Lab Results  Component Value Date   NA 138 10/08/2019   K 4.5 10/08/2019   CL 102 10/08/2019   CO2 27 10/08/2019   BUN 18 10/08/2019   CREATININE 1.09 10/08/2019   GLUCOSE 100 (H) 10/08/2019    Discharge Medications:   Allergies as of 10/15/2019   No Known Allergies     Medication List    TAKE these medications   aspirin 81 MG tablet Take 162 mg by  mouth daily.   atorvastatin 10 MG tablet Commonly known as: LIPITOR Take 10 mg by mouth every morning.   doxazosin 4 MG tablet Commonly known as: CARDURA Take 4 mg by mouth at bedtime.   FLUoxetine 20 MG capsule Commonly known as: PROZAC Take 60 mg by mouth every morning.   lisinopril 40 MG tablet Commonly known as: ZESTRIL Take 40 mg by mouth every morning.   meloxicam 15 MG tablet Commonly known as: MOBIC Take 15 mg by mouth daily.   metFORMIN 500 MG tablet Commonly known as: GLUCOPHAGE Take 500 mg by mouth 2 (two) times daily with a meal.   metoprolol succinate 100 MG 24 hr tablet Commonly known as: TOPROL-XL Take 100 mg by mouth every morning. Take with or immediately following a meal.   oxyCODONE 5 MG immediate release  tablet Commonly known as: Oxy IR/ROXICODONE Take 5 mg by mouth 5 (five) times daily.   oxyCODONE-acetaminophen 10-325 MG tablet Commonly known as: Percocet Take 1 tablet by mouth every 6 (six) hours as needed for pain.       Diagnostic Studies: Ct Shoulder Right Wo Contrast  Result Date: 09/23/2019 CLINICAL DATA:  Primary osteoarthritis of the right shoulder. Pre-surgical planning. EXAM: CT OF THE UPPER RIGHT EXTREMITY WITHOUT CONTRAST TECHNIQUE: Multidetector CT imaging of the upper right extremity was performed according to the standard protocol. COMPARISON:  CT scan dated 08/31/2019 FINDINGS: Bones/Joint/Cartilage There is severe osteoarthritis of the glenohumeral joint with diffuse full-thickness cartilage loss, marginal osteophyte formation on the humeral head and glenoid, multiple ossified loose bodies in the joint, a joint effusion, and reshaping of the glenoid due to the chronic arthritis. Muscles and Tendons Normal. Soft tissues Normal. IMPRESSION: Severe osteoarthritis of the glenohumeral joint as described. Electronically Signed   By: Lorriane Shire M.D.   On: 09/23/2019 08:05   [place] Or Nerve Block-image Only ([facility])  Result Date: 10/13/2019 There is no interpretation for this exam.  This order is for images obtained during a surgical procedure.  Please See "Surgeries" Tab for more information regarding the procedure.    Disposition: Discharge disposition: 01-Home or Self Care         Follow-up Information    Lovell Sheehan, MD On 10/26/2019.   Specialty: Orthopedic Surgery Why: @ 9:40 am Contact information: [place] [address] [phone]            Signed: Carlynn Spry ,PA-C 10/15/2019, 6:58 AM

## 2019-10-15 NOTE — Progress Notes (Signed)
D: Pt alert and oriented.   A: Pt and caregiver received discharge and medication education/information. Pt belongings were gathered and taken w/pt upon discharge. Pt's BSC was taken home with significant other yesterday. Pt's IV was removed and final VS were taken.   R: Pt and caregiver verbalized understanding of discharge and medication education/information.  Pt escorted via wheelchair by staff to medical mall front lobby where pov is parked

## 2019-10-15 NOTE — Progress Notes (Signed)
  Subjective:  Patient reports pain as mild to moderate.  Block wearing off.  Pain level up.  Objective:   VITALS:   Vitals:   10/14/19 1341 10/14/19 1644 10/14/19 2106 10/15/19 0105  BP: 111/69 102/76 (!) 151/71 129/87  Pulse: (!) 58 (!) 57 62 70  Resp: '18 18 16 18  '$ Temp: 98.2 F (36.8 C) 98.4 F (36.9 C) 98.1 F (36.7 C) 98.5 F (36.9 C)  TempSrc: Oral Oral Oral Oral  SpO2: 92% 93% 94% 92%  Weight:        PHYSICAL EXAM:  Neurologically intact ABD soft Neurovascular intact Sensation intact distally Intact pulses distally Dorsiflexion/Plantar flexion intact Incision: scant drainage No cellulitis present Compartment soft dressing changed to aquacel  LABS  No results found for this or any previous visit (from the past 24 hour(s)).  [place] Or Nerve Block-image Only ([facility])  Result Date: 10/13/2019 There is no interpretation for this exam.  This order is for images obtained during a surgical procedure.  Please See "Surgeries" Tab for more information regarding the procedure.    Assessment/Plan: 2 Days Post-Op   Active Problems:   Status post total shoulder arthroplasty, right   Advance diet Up with therapy Discharge home with home health if PT goals met today   Carlynn Spry , PA-C 10/15/2019, 6:55 AM

## 2019-10-15 NOTE — Progress Notes (Signed)
Occupational Therapy Treatment Patient Details Name: Herbert Chapman MRN: [id number] DOB: 09-20-1946 Today's Date: 10/15/2019    History of present illness 73yo male pt POD1 s/p R TSA on 10/13/2019. PMHx includes anxiety, depression, DM, HTN, CRF, and per pt hx of 2 back surgeries.   OT comments  Pt seen for OT tx this date; spouse present and engaged in session. Pt and spouse instructed in shoulder sling mgt, falls prevention, AE/DME, home/routines modifications, positioning of RUE in bed and hemi techniques for ADL; handout provided to support recall and carryover. Therapist answered questions and provide additional instruction and guidance for home set up to support safe return home. Pt continues to benefit from skilled OT Services to maximize return to PLOF and minimize risk of falls.    Follow Up Recommendations  Home health OT    Equipment Recommendations  3 in 1 bedside commode;Other (comment)(reacher)    Recommendations for Other Services      Precautions / Restrictions Precautions Precautions: Shoulder;Fall Type of Shoulder Precautions: no shoulder ROM, elbow/wrist/hand ROM ok, RUE NWBing Shoulder Interventions: Shoulder sling/immobilizer;At all times;Off for dressing/bathing/exercises Precaution Booklet Issued: Yes (comment) Restrictions Weight Bearing Restrictions: Yes RUE Weight Bearing: Non weight bearing       Mobility Bed Mobility                  Transfers                      Balance                                           ADL either performed or assessed with clinical judgement   ADL Overall ADL's : Needs assistance/impaired                                       General ADL Comments: Continues to require min-mod assist for underarm grooming/bathing and UB bathing and dressing; spouse able to provide needed level of assist     Vision Baseline Vision/History: Wears glasses Wears Glasses: At all  times Patient Visual Report: No change from baseline     Perception     Praxis      Cognition   Behavior During Therapy: [facility] for tasks assessed/performed Overall Cognitive Status: Within Functional Limits for tasks assessed                                          Exercises Other Exercises Other Exercises: Pt and spouse instructed in shoulder sling mgt, falls prevention, AE/DME, home/routines modifications, positioning of RUE in bed and hemi techniques for ADL; handout provided to support recall and carryover   Shoulder Instructions       General Comments      Pertinent Vitals/ Pain       Pain Assessment: 0-10 Pain Score: 4  Pain Location: R shoulder Pain Descriptors / Indicators: Aching Pain Intervention(s): Limited activity within patient's tolerance;Premedicated before session;Monitored during session;Repositioned  Home Living  Prior Functioning/Environment              Frequency  Min 2X/week        Progress Toward Goals  OT Goals(current goals can now be found in the care plan section)  Progress towards OT goals: Progressing toward goals  Acute Rehab OT Goals Patient Stated Goal: get better and return home OT Goal Formulation: With patient/family Time For Goal Achievement: 10/28/19 Potential to Achieve Goals: Good  Plan Discharge plan remains appropriate;Frequency remains appropriate    Co-evaluation                 AM-PAC OT "6 Clicks" Daily Activity     Outcome Measure   Help from another person eating meals?: None Help from another person taking care of personal grooming?: A Little Help from another person toileting, which includes using toliet, bedpan, or urinal?: A Little Help from another person bathing (including washing, rinsing, drying)?: A Lot Help from another person to put on and taking off regular upper body clothing?: A Lot Help from another person  to put on and taking off regular lower body clothing?: A Lot 6 Click Score: 16    End of Session    OT Visit Diagnosis: Other abnormalities of gait and mobility (R26.89)   Activity Tolerance Patient tolerated treatment well   Patient Left in chair;with call bell/phone within reach;with chair alarm set;with family/visitor present   Nurse Communication          Time: 1030-1054 OT Time Calculation (min): 24 min  Charges: OT General Charges $OT Visit: 1 Visit OT Treatments $Self Care/Home Management : 23-37 mins  Jeni Salles, MPH, MS, OTR/L ascom [phone] 10/15/19, 11:04 AM

## 2019-10-15 NOTE — TOC Transition Note (Signed)
Transition of Care [facility]) - CM/SW Discharge Note   Patient Details  Name: Herbert Chapman MRN: [id number] Date of Birth: January 22, 1946  [facility] CM/SW Contact:  Su Hilt, RN Phone Number: 10/15/2019, 9:56 AM   Clinical Narrative:     Patient to discharge home today with his partner that is providing transportation, 3 in 1 in the room provided by [employer], Set up with [facility] for [facility] services prior to surgery by Dr Harlow Mares office  Final next level of care: [place] Barriers to Discharge: Barriers Resolved   Patient Goals and CMS Choice Patient states their goals for this hospitalization and ongoing recovery are:: go home      Discharge Placement                       Discharge Plan and Services   Discharge Planning Services: CM Consult            DME Arranged: 3-N-1 DME Agency: [employer] Date DME Agency Contacted: 10/14/19 Time DME Agency Contacted: [phone] Representative spoke with at DME Agency: [place]: PT, OT [place] Agency: [facility] [place] Date [place]: 10/14/19 Time [place]: 1519 Representative spoke with at [place]: [place] ([place]) Interventions     Readmission Risk Interventions No flowsheet data found.

## 2020-05-15 ENCOUNTER — Telehealth: Payer: Self-pay

## 2020-05-15 DIAGNOSIS — Z87891 Personal history of nicotine dependence: Secondary | ICD-10-CM

## 2020-05-15 DIAGNOSIS — Z122 Encounter for screening for malignant neoplasm of respiratory organs: Secondary | ICD-10-CM

## 2020-05-15 NOTE — Telephone Encounter (Signed)
Patient has been notified that the low dose lung cancer screening CT scan is due currently or will be in near future.  Confirmed that patient is within the appropriate age range and asymptomatic, (no signs or symptoms of lung cancer).  Patient denies illness that would prevent curative treatment for lung cancer if found.  Patient is agreeable for CT scan being scheduled.    Verified smoking history (former smoker, quit 2019 with 30 year history).   CT scan scheduled for 05/22/20 @ 8:30

## 2020-05-16 NOTE — Addendum Note (Signed)
Addended by: Lieutenant Diego on: 05/16/2020 10:34 AM   Modules accepted: Orders

## 2020-05-16 NOTE — Telephone Encounter (Signed)
Smoking history: former, quit 09/2018, 30.5 pack year

## 2020-05-22 ENCOUNTER — Other Ambulatory Visit: Payer: Self-pay

## 2020-05-22 ENCOUNTER — Ambulatory Visit
Admission: RE | Admit: 2020-05-22 | Discharge: 2020-05-22 | Disposition: A | Discharge status: Home / Self Care | Payer: Medicare Other | Source: Ambulatory Visit | Attending: Oncology | Admitting: Oncology

## 2020-05-22 DIAGNOSIS — Z87891 Personal history of nicotine dependence: Secondary | ICD-10-CM | POA: Insufficient documentation

## 2020-05-22 DIAGNOSIS — Z122 Encounter for screening for malignant neoplasm of respiratory organs: Secondary | ICD-10-CM | POA: Diagnosis present

## 2020-05-25 ENCOUNTER — Encounter: Payer: Self-pay | Admitting: *Deleted

## 2020-06-26 ENCOUNTER — Ambulatory Visit (INDEPENDENT_AMBULATORY_CARE_PROVIDER_SITE_OTHER): Payer: Medicare Other

## 2020-06-26 ENCOUNTER — Encounter: Payer: Self-pay | Admitting: Emergency Medicine

## 2020-06-26 ENCOUNTER — Other Ambulatory Visit: Payer: Self-pay

## 2020-06-26 ENCOUNTER — Ambulatory Visit
Admission: EM | Admit: 2020-06-26 | Discharge: 2020-06-26 | Disposition: A | Discharge status: Home / Self Care | Payer: Medicare Other | Attending: Internal Medicine | Admitting: Internal Medicine

## 2020-06-26 DIAGNOSIS — N23 Unspecified renal colic: Secondary | ICD-10-CM | POA: Diagnosis present

## 2020-06-26 DIAGNOSIS — K862 Cyst of pancreas: Secondary | ICD-10-CM | POA: Diagnosis present

## 2020-06-26 LAB — URINALYSIS, COMPLETE (UACMP) WITH MICROSCOPIC
Bacteria, UA: NONE SEEN
Bilirubin Urine: NEGATIVE
Glucose, UA: NEGATIVE mg/dL
Ketones, ur: NEGATIVE mg/dL
Leukocytes,Ua: NEGATIVE
Nitrite: NEGATIVE
Protein, ur: 30 mg/dL — AB
RBC / HPF: >50 RBC/hpf (ref 0–5)
Specific Gravity, Urine: 1.02 (ref 1.005–1.030)
Squamous Epithelial / HPF: NONE SEEN (ref 0–5)
WBC, UA: NONE SEEN WBC/hpf (ref 0–5)
pH: 5.5 (ref 5.0–8.0)

## 2020-06-26 LAB — CBC WITH DIFFERENTIAL/PLATELET
Abs Immature Granulocytes: 0.03 10*3/uL (ref 0.00–0.07)
Basophils Absolute: 0 10*3/uL (ref 0.0–0.1)
Basophils Relative: 0 %
Eosinophils Absolute: 0.1 10*3/uL (ref 0.0–0.5)
Eosinophils Relative: 1 %
HCT: 48.9 % (ref 39.0–52.0)
Hemoglobin: 16.5 g/dL (ref 13.0–17.0)
Immature Granulocytes: 0 %
Lymphocytes Relative: 13 %
Lymphs Abs: 1.2 10*3/uL (ref 0.7–4.0)
MCH: 30.1 pg (ref 26.0–34.0)
MCHC: 33.7 g/dL (ref 30.0–36.0)
MCV: 89.1 fL (ref 80.0–100.0)
Monocytes Absolute: 0.6 10*3/uL (ref 0.1–1.0)
Monocytes Relative: 7 %
Neutro Abs: 7.3 10*3/uL (ref 1.7–7.7)
Neutrophils Relative %: 79 %
Platelets: 137 10*3/uL — ABNORMAL LOW (ref 150–400)
RBC: 5.49 MIL/uL (ref 4.22–5.81)
RDW: 12.7 % (ref 11.5–15.5)
WBC: 9.2 10*3/uL (ref 4.0–10.5)
nRBC: 0 % (ref 0.0–0.2)

## 2020-06-26 MED ORDER — TAMSULOSIN HCL 0.4 MG PO CAPS: 0.4000 mg | ORAL_CAPSULE | Freq: Every day | ORAL | 0 refills | Status: DC

## 2020-06-26 MED ORDER — HYDROCODONE-ACETAMINOPHEN 5-325 MG PO TABS: 1.0000 | ORAL_TABLET | Freq: Four times a day (QID) | ORAL | 0 refills | Status: AC | PRN

## 2020-06-26 MED ORDER — KETOROLAC TROMETHAMINE 60 MG/2ML IM SOLN
60.0000 mg | Freq: Once | INTRAMUSCULAR | Status: AC
  Administered 2020-06-26: 60 mg via INTRAMUSCULAR

## 2020-06-26 NOTE — ED Provider Notes (Signed)
[facility]    CSN: [id number] Arrival date & time: 06/26/20  9735      History   Chief Complaint Chief Complaint  Patient presents with  . Abdominal Pain    HPI Herbert Chapman is a 74 y.o. male. who presents with constant lower abdominal pain all across since he got up this am. He thought it could be from constipation and had a BM, but the pain has not changed. Has intermittent cloudy urine, but denies dysuria or difficulty emptying his bladder. Has been nauseous, but has not vomited. Has not had an appetite today.    Past Medical History:  Diagnosis Date  . Anxiety   . Arthritis   . Cancer ([place])    squamous cell CA removed from skin  . Depression   . Diabetes mellitus without complication ([place])   . GERD (gastroesophageal reflux disease)    rare-no meds  . Headache    h/o migraines  . Hepatitis    Hep C-pt had injections and pill and pt states he does not have hep c anymore  . History of kidney stones    h/o age 33  . Hypertension   . Mental disorder   . Pancreatitis 2012  . Personal history of tobacco use, presenting hazards to health 11/07/2015    Patient Active Problem List   Diagnosis Date Noted  . Status post total shoulder arthroplasty, right 10/13/2019  . Depression 01/13/2017  . Hepatitis C 01/13/2017  . Hypertension 01/13/2017  . Squamous cell carcinoma 01/13/2017  . Type 2 diabetes mellitus ([place]) 01/13/2017  . Atherosclerosis 02/23/2016  . CKD (chronic kidney disease) stage 3, GFR 30-59 ml/min 02/23/2016  . Personal history of tobacco use, presenting hazards to health 11/07/2015  . Benign non-nodular prostatic hyperplasia with lower urinary tract symptoms 03/16/2015  . Chronic back pain 03/16/2015    Past Surgical History:  Procedure Laterality Date  . CERVICAL SPINE SURGERY    . CIRCUMCISION    . COLONOSCOPY    . EYE SURGERY     bilateral cataract removal; implants  . LUMBAR LAMINECTOMY/DECOMPRESSION MICRODISCECTOMY  10/07/2012    Procedure: LUMBAR LAMINECTOMY/DECOMPRESSION MICRODISCECTOMY 2 LEVELS;  Surgeon: Eustace Moore, MD;  Location: [facility];  Service: Neurosurgery;  Laterality: Left;  Left Lumbar two-three,lumbar four-five hemilaminectomy  . PILONIDAL CYST EXCISION    . SPINAL CORD STIMULATOR IMPLANT  2018   [employer]  . TONSILLECTOMY    . TOTAL SHOULDER ARTHROPLASTY Right 10/13/2019   Procedure: TOTAL SHOULDER ARTHROPLASTY;  Surgeon: Lovell Sheehan, MD;  Location: [facility];  Service: Orthopedics;  Laterality: Right;       Home Medications    Prior to Admission medications   Medication Sig Start Date End Date Taking? Authorizing Provider  aspirin 81 MG tablet Take 162 mg by mouth daily.   Yes [provider]  atorvastatin (LIPITOR) 10 MG tablet Take 10 mg by mouth every morning.  02/23/16  Yes [provider]  doxazosin (CARDURA) 4 MG tablet Take 4 mg by mouth at bedtime.   Yes [provider]  FLUoxetine (PROZAC) 20 MG capsule Take 60 mg by mouth every morning.    Yes [provider]  lisinopril (PRINIVIL,ZESTRIL) 40 MG tablet Take 40 mg by mouth every morning.    Yes [provider]  metFORMIN (GLUCOPHAGE) 500 MG tablet Take 500 mg by mouth 2 (two) times daily with a meal.   Yes [provider]  metoprolol succinate (TOPROL-XL) 100  MG 24 hr tablet Take 100 mg by mouth every morning. Take with or immediately following a meal.    Yes [provider]  oxyCODONE (OXY IR/ROXICODONE) 5 MG immediate release tablet Take 5 mg by mouth 5 (five) times daily.   Yes [provider]  HYDROcodone-acetaminophen (NORCO/VICODIN) 5-325 MG tablet Take 1-2 tablets by mouth every 6 (six) hours as needed for up to 3 days. 06/26/20 06/29/20  Rodriguez-Southworth, Sunday Spillers, PA-C  tamsulosin (FLOMAX) 0.4 MG CAPS capsule Take 1 capsule (0.4 mg total) by mouth daily. 06/26/20   Rodriguez-Southworth, Sunday Spillers, PA-C    Family History History reviewed. No  pertinent family history.  Social History Social History   Tobacco Use  . Smoking status: Former Smoker    Packs/day: 1.00    Years: 30.00    Pack years: 30.00    Types: Cigarettes    Quit date: 10/07/2018    Years since quitting: 1.7  . Smokeless tobacco: Never Used  Vaping Use  . Vaping Use: Never used  Substance Use Topics  . Alcohol use: Not Currently  . Drug use: No     Allergies   Patient has no known allergies.   Review of Systems Review of Systems  Constitutional: Positive for appetite change. Negative for chills, diaphoresis and fever.  Gastrointestinal: Positive for abdominal pain and nausea. Negative for constipation, diarrhea and rectal pain.  Genitourinary: Positive for frequency. Negative for decreased urine volume, difficulty urinating, dysuria, flank pain and hematuria.  Musculoskeletal: Positive for back pain.       Has chronic low back pain  Skin: Negative for rash.  Neurological: Negative for dizziness and weakness.     Physical Exam Triage Vital Signs ED Triage Vitals  Enc Vitals Group     BP 06/26/20 1014 (!) 191/101     Pulse Rate 06/26/20 1014 60     Resp 06/26/20 1014 18     Temp 06/26/20 1014 98.5 F (36.9 C)     Temp Source 06/26/20 1014 Oral     SpO2 06/26/20 1014 100 %     Weight 06/26/20 1011 192 lb 0.3 oz (87.1 kg)     Height 06/26/20 1011 5' 7.5" (1.715 m)     Head Circumference --      Peak Flow --      Pain Score 06/26/20 1010 4     Pain Loc --      Pain Edu? --      Excl. in [place]? --    No data found.  Updated Vital Signs BP (!) 191/101 (BP Location: Left Arm) Comment: pt did not take BP medications this am  Pulse 60   Temp 98.5 F (36.9 C) (Oral)   Resp 18   Ht 5' 7.5" (1.715 m)   Wt 192 lb 0.3 oz (87.1 kg)   SpO2 100%   BMI 29.63 kg/m   Visual Acuity Right Eye Distance:   Left Eye Distance:   Bilateral Distance:    Right Eye Near:   Left Eye Near:    Bilateral Near:     Physical Exam Vitals and nursing  note reviewed.  Constitutional:      Appearance: He is obese.     Comments: Walks slow and uses cane  HENT:     Head: Normocephalic.  Eyes:     General: No scleral icterus.    Extraocular Movements: Extraocular movements intact.  Cardiovascular:     Rate and Rhythm: Normal rate and regular rhythm.  Pulmonary:  Effort: Pulmonary effort is normal.     Breath sounds: Normal breath sounds.  Abdominal:     General: Abdomen is protuberant. Bowel sounds are normal. There is no distension or abdominal bruit. There are no signs of injury.     Palpations: Abdomen is soft. There is no hepatomegaly or splenomegaly.     Tenderness: There is abdominal tenderness in the right lower quadrant, suprapubic area and left lower quadrant. There is right CVA tenderness. There is no guarding or rebound. Negative signs include psoas sign.  Skin:    General: Skin is warm and dry.     Findings: No rash.  Neurological:     General: No focal deficit present.     Mental Status: He is alert and oriented to person, place, and time.  Psychiatric:        Mood and Affect: Mood normal.        Behavior: Behavior normal.      UC Treatments / Results  Labs (all labs ordered are listed, but only abnormal results are displayed) Labs Reviewed  URINALYSIS, COMPLETE (UACMP) WITH MICROSCOPIC - Abnormal; Notable for the following components:      Result Value   APPearance HAZY (*)    Hgb urine dipstick LARGE (*)    Protein, ur 30 (*)    All other components within normal limits  CBC WITH DIFFERENTIAL/PLATELET - Abnormal; Notable for the following components:   Platelets 137 (*)    All other components within normal limits    EKG   Radiology CT ABDOMEN PELVIS WO CONTRAST  Result Date: 06/26/2020 CLINICAL DATA:  Unspecified lower RIGHT lower quadrant pain beginning this morning EXAM: CT ABDOMEN AND PELVIS WITHOUT CONTRAST TECHNIQUE: Multidetector CT imaging of the abdomen and pelvis was performed following  the standard protocol without IV contrast. Sagittal and coronal MPR images reconstructed from axial data set. No oral contrast was administered. COMPARISON:  11/30/2013 FINDINGS: Lower chest: Lung bases clear. Hepatobiliary: Gallbladder and liver normal appearance Pancreas: Scattered parenchymal calcifications in pancreas question chronic calcific pancreatitis. Small cystic lesion at pancreatic body 16 x 14 x 18 mm increased since 2014 when it measured 12 x 8 x 10 mm. No additional masses. Spleen: Normal appearance Adrenals/Urinary Tract: Adrenal glands normal appearance. BILATERAL renal cysts at again seen, largest RIGHT 3.3 x 2.8 cm image 19 and largest LEFT 2.8 x 2.2 cm little changed. Mild RIGHT hydronephrosis and proximal RIGHT hydroureter with perinephric edema secondary to a 2 mm proximal RIGHT ureteral calculus image 40. Distal ureters and bladder unremarkable. Stomach/Bowel: Stomach and bowel loops unremarkable Vascular/Lymphatic: Extensive atherosclerotic calcifications aorta, iliac arteries, visceral arteries, coronary arteries. No adenopathy. Reproductive: Scattered prostatic calcifications, gland measuring 4.4 x 3.9 cm. Other: No free air or free fluid.  No hernia. Musculoskeletal: Intraspinal stimulator noted. Extensive degenerative changes of the thoracolumbar spine with prior L4-L5 fusion. IMPRESSION: Mild RIGHT hydronephrosis, proximal hydroureter and perinephric edema secondary to a 2 mm proximal RIGHT ureteral calculus. BILATERAL renal cysts. Changes of chronic calcific pancreatitis with interval increase in size of a cystic lesion of the pancreatic body 18 mm greatest size; either follow-up MR imaging in 6 months or further characterization by EUS recommended. Mild prostatic enlargement. Electronically Signed   By: Lavonia Dana M.D.   On: 06/26/2020 13:05    Procedures Procedures (including critical care time)  Medications Ordered in UC Medications  ketorolac (TORADOL) injection 60 mg (60  mg Intramuscular Given 06/26/20 1318)    Initial Impression / Assessment and  Plan / UC Course  I have reviewed the triage vital signs and the nursing notes. Pertinent labs & imaging results that were available during my care of the patient were reviewed by me and considered in my medical decision making (see chart for details). I placed him on Flomax and Norco. Needs to FU with PCP this week.  At 2 pm I called him and left him a message to make sure he FUs with his PCP regarding a pancreatic cyst that seems larger than prior time and to call me if he has further questions.  Final Clinical Impressions(s) / UC Diagnoses   Final diagnoses:  Renal colic on right side     Discharge Instructions     Drink at least 1 gallon of water a day. It may take up to 2 weeks to pass, but if your pain is worse go to the ER. Your R kidney is a little swollen from this( hydronephrosis).  Follow up with your family Dr this week in case you need referral to a urologist.  Discontinue the oxycodone while on the hydrocodone/     ED Prescriptions    Medication Sig Dispense Auth. Provider   tamsulosin (FLOMAX) 0.4 MG CAPS capsule Take 1 capsule (0.4 mg total) by mouth daily. 14 capsule Rodriguez-Southworth, Sunday Spillers, PA-C   HYDROcodone-acetaminophen (NORCO/VICODIN) 5-325 MG tablet Take 1-2 tablets by mouth every 6 (six) hours as needed for up to 3 days. 15 tablet Rodriguez-Southworth, Sunday Spillers, PA-C     I have reviewed the PDMP during this encounter.   Shelby Mattocks, PA-C 06/26/20 1402

## 2020-06-26 NOTE — Discharge Instructions (Addendum)
Drink at least 1 gallon of water a day. It may take up to 2 weeks to pass, but if your pain is worse go to the ER. Your R kidney is a little swollen from this( hydronephrosis).  Follow up with your family Dr this week in case you need referral to a urologist.  Discontinue the oxycodone while on the hydrocodone/

## 2020-06-26 NOTE — ED Triage Notes (Signed)
Pt c/o lower abdominal pain. He states it started this morning and is across his entire lower abdomen but worse on the right side. He states he has a normal BM this morning.  Denies vomiting or diarrhea. Does have some nausea. He states he took his normal dose of oxycodone with no relief.

## 2021-05-23 ENCOUNTER — Telehealth: Payer: Self-pay

## 2021-05-23 NOTE — Telephone Encounter (Signed)
Left message for patient to notify them that it is time to schedule annual low dose lung cancer screening CT scan. Instructed patient to call back ([phone]) to verify information and schedule.  

## 2022-06-25 ENCOUNTER — Telehealth: Payer: Self-pay | Admitting: *Deleted

## 2022-06-25 NOTE — Telephone Encounter (Signed)
Left message to call back to schedule follow up Lung CA CT Scan.

## 2023-12-21 DIAGNOSIS — U071 COVID-19: Secondary | ICD-10-CM | POA: Diagnosis not present

## 2023-12-21 DIAGNOSIS — R059 Cough, unspecified: Secondary | ICD-10-CM | POA: Diagnosis not present

## 2023-12-21 DIAGNOSIS — Z20822 Contact with and (suspected) exposure to covid-19: Secondary | ICD-10-CM | POA: Diagnosis not present

## 2024-01-22 ENCOUNTER — Emergency Department
Admission: EM | Admit: 2024-01-22 | Discharge: 2024-01-22 | Disposition: A | Discharge status: Home / Self Care | Payer: Medicare HMO | Attending: Emergency Medicine | Admitting: Emergency Medicine

## 2024-01-22 ENCOUNTER — Other Ambulatory Visit: Payer: Self-pay

## 2024-01-22 ENCOUNTER — Inpatient Hospital Stay (HOSPITAL_COMMUNITY)
Admission: EM | Admit: 2024-01-22 | Discharge: 2024-01-30 | DRG: 064 | Disposition: A | Discharge status: Skilled Nursing Facility | Payer: Medicare HMO | Source: Other Acute Inpatient Hospital | Attending: Internal Medicine | Admitting: Internal Medicine

## 2024-01-22 ENCOUNTER — Encounter (HOSPITAL_COMMUNITY): Payer: Self-pay

## 2024-01-22 ENCOUNTER — Encounter: Payer: Self-pay | Admitting: Emergency Medicine

## 2024-01-22 ENCOUNTER — Inpatient Hospital Stay (HOSPITAL_COMMUNITY): Payer: Medicare HMO

## 2024-01-22 ENCOUNTER — Emergency Department: Payer: Medicare HMO

## 2024-01-22 DIAGNOSIS — E119 Type 2 diabetes mellitus without complications: Secondary | ICD-10-CM | POA: Diagnosis not present

## 2024-01-22 DIAGNOSIS — R29818 Other symptoms and signs involving the nervous system: Secondary | ICD-10-CM | POA: Diagnosis not present

## 2024-01-22 DIAGNOSIS — I4891 Unspecified atrial fibrillation: Secondary | ICD-10-CM | POA: Diagnosis not present

## 2024-01-22 DIAGNOSIS — M549 Dorsalgia, unspecified: Secondary | ICD-10-CM | POA: Diagnosis present

## 2024-01-22 DIAGNOSIS — S06310A Contusion and laceration of right cerebrum without loss of consciousness, initial encounter: Principal | ICD-10-CM

## 2024-01-22 DIAGNOSIS — Z6829 Body mass index (BMI) 29.0-29.9, adult: Secondary | ICD-10-CM

## 2024-01-22 DIAGNOSIS — Z79899 Other long term (current) drug therapy: Secondary | ICD-10-CM | POA: Diagnosis not present

## 2024-01-22 DIAGNOSIS — G8194 Hemiplegia, unspecified affecting left nondominant side: Secondary | ICD-10-CM | POA: Diagnosis present

## 2024-01-22 DIAGNOSIS — R1312 Dysphagia, oropharyngeal phase: Secondary | ICD-10-CM | POA: Diagnosis present

## 2024-01-22 DIAGNOSIS — R29733 NIHSS score 33: Secondary | ICD-10-CM | POA: Diagnosis not present

## 2024-01-22 DIAGNOSIS — J690 Pneumonitis due to inhalation of food and vomit: Secondary | ICD-10-CM | POA: Diagnosis not present

## 2024-01-22 DIAGNOSIS — I161 Hypertensive emergency: Secondary | ICD-10-CM

## 2024-01-22 DIAGNOSIS — I129 Hypertensive chronic kidney disease with stage 1 through stage 4 chronic kidney disease, or unspecified chronic kidney disease: Secondary | ICD-10-CM | POA: Diagnosis present

## 2024-01-22 DIAGNOSIS — M1990 Unspecified osteoarthritis, unspecified site: Secondary | ICD-10-CM | POA: Diagnosis present

## 2024-01-22 DIAGNOSIS — I10 Essential (primary) hypertension: Secondary | ICD-10-CM | POA: Diagnosis not present

## 2024-01-22 DIAGNOSIS — G8929 Other chronic pain: Secondary | ICD-10-CM | POA: Diagnosis present

## 2024-01-22 DIAGNOSIS — Y92009 Unspecified place in unspecified non-institutional (private) residence as the place of occurrence of the external cause: Secondary | ICD-10-CM

## 2024-01-22 DIAGNOSIS — W19XXXA Unspecified fall, initial encounter: Secondary | ICD-10-CM | POA: Diagnosis present

## 2024-01-22 DIAGNOSIS — I610 Nontraumatic intracerebral hemorrhage in hemisphere, subcortical: Secondary | ICD-10-CM

## 2024-01-22 DIAGNOSIS — Z7409 Other reduced mobility: Secondary | ICD-10-CM | POA: Diagnosis present

## 2024-01-22 DIAGNOSIS — D72829 Elevated white blood cell count, unspecified: Secondary | ICD-10-CM | POA: Diagnosis not present

## 2024-01-22 DIAGNOSIS — Z87442 Personal history of urinary calculi: Secondary | ICD-10-CM

## 2024-01-22 DIAGNOSIS — I6523 Occlusion and stenosis of bilateral carotid arteries: Secondary | ICD-10-CM | POA: Diagnosis present

## 2024-01-22 DIAGNOSIS — J9600 Acute respiratory failure, unspecified whether with hypoxia or hypercapnia: Secondary | ICD-10-CM | POA: Diagnosis not present

## 2024-01-22 DIAGNOSIS — R29712 NIHSS score 12: Secondary | ICD-10-CM | POA: Diagnosis not present

## 2024-01-22 DIAGNOSIS — I517 Cardiomegaly: Secondary | ICD-10-CM | POA: Diagnosis not present

## 2024-01-22 DIAGNOSIS — J984 Other disorders of lung: Secondary | ICD-10-CM | POA: Diagnosis not present

## 2024-01-22 DIAGNOSIS — Z7982 Long term (current) use of aspirin: Secondary | ICD-10-CM | POA: Insufficient documentation

## 2024-01-22 DIAGNOSIS — I619 Nontraumatic intracerebral hemorrhage, unspecified: Secondary | ICD-10-CM | POA: Diagnosis present

## 2024-01-22 DIAGNOSIS — Z7401 Bed confinement status: Secondary | ICD-10-CM | POA: Diagnosis not present

## 2024-01-22 DIAGNOSIS — I63531 Cerebral infarction due to unspecified occlusion or stenosis of right posterior cerebral artery: Secondary | ICD-10-CM | POA: Diagnosis not present

## 2024-01-22 DIAGNOSIS — R0989 Other specified symptoms and signs involving the circulatory and respiratory systems: Secondary | ICD-10-CM | POA: Diagnosis not present

## 2024-01-22 DIAGNOSIS — I672 Cerebral atherosclerosis: Secondary | ICD-10-CM | POA: Diagnosis not present

## 2024-01-22 DIAGNOSIS — Z794 Long term (current) use of insulin: Secondary | ICD-10-CM | POA: Diagnosis not present

## 2024-01-22 DIAGNOSIS — E1165 Type 2 diabetes mellitus with hyperglycemia: Secondary | ICD-10-CM | POA: Diagnosis present

## 2024-01-22 DIAGNOSIS — E8809 Other disorders of plasma-protein metabolism, not elsewhere classified: Secondary | ICD-10-CM | POA: Diagnosis present

## 2024-01-22 DIAGNOSIS — R414 Neurologic neglect syndrome: Secondary | ICD-10-CM | POA: Diagnosis present

## 2024-01-22 DIAGNOSIS — R531 Weakness: Secondary | ICD-10-CM | POA: Diagnosis not present

## 2024-01-22 DIAGNOSIS — R29810 Facial weakness: Secondary | ICD-10-CM | POA: Diagnosis present

## 2024-01-22 DIAGNOSIS — W01198A Fall on same level from slipping, tripping and stumbling with subsequent striking against other object, initial encounter: Secondary | ICD-10-CM | POA: Diagnosis not present

## 2024-01-22 DIAGNOSIS — Z87891 Personal history of nicotine dependence: Secondary | ICD-10-CM

## 2024-01-22 DIAGNOSIS — E1122 Type 2 diabetes mellitus with diabetic chronic kidney disease: Secondary | ICD-10-CM | POA: Diagnosis present

## 2024-01-22 DIAGNOSIS — I615 Nontraumatic intracerebral hemorrhage, intraventricular: Secondary | ICD-10-CM

## 2024-01-22 DIAGNOSIS — I6782 Cerebral ischemia: Secondary | ICD-10-CM | POA: Diagnosis not present

## 2024-01-22 DIAGNOSIS — I6381 Other cerebral infarction due to occlusion or stenosis of small artery: Secondary | ICD-10-CM

## 2024-01-22 DIAGNOSIS — R471 Dysarthria and anarthria: Secondary | ICD-10-CM

## 2024-01-22 DIAGNOSIS — N1831 Chronic kidney disease, stage 3a: Secondary | ICD-10-CM | POA: Diagnosis present

## 2024-01-22 DIAGNOSIS — G919 Hydrocephalus, unspecified: Secondary | ICD-10-CM | POA: Diagnosis present

## 2024-01-22 DIAGNOSIS — E440 Moderate protein-calorie malnutrition: Secondary | ICD-10-CM | POA: Diagnosis present

## 2024-01-22 DIAGNOSIS — R29708 NIHSS score 8: Secondary | ICD-10-CM | POA: Diagnosis not present

## 2024-01-22 DIAGNOSIS — S0990XA Unspecified injury of head, initial encounter: Secondary | ICD-10-CM | POA: Diagnosis present

## 2024-01-22 DIAGNOSIS — E872 Acidosis, unspecified: Secondary | ICD-10-CM | POA: Diagnosis present

## 2024-01-22 DIAGNOSIS — R29706 NIHSS score 6: Secondary | ICD-10-CM | POA: Diagnosis not present

## 2024-01-22 DIAGNOSIS — S06340A Traumatic hemorrhage of right cerebrum without loss of consciousness, initial encounter: Secondary | ICD-10-CM | POA: Diagnosis not present

## 2024-01-22 DIAGNOSIS — F32A Depression, unspecified: Secondary | ICD-10-CM | POA: Diagnosis present

## 2024-01-22 DIAGNOSIS — Z96611 Presence of right artificial shoulder joint: Secondary | ICD-10-CM | POA: Diagnosis present

## 2024-01-22 DIAGNOSIS — Z7984 Long term (current) use of oral hypoglycemic drugs: Secondary | ICD-10-CM

## 2024-01-22 DIAGNOSIS — Z8619 Personal history of other infectious and parasitic diseases: Secondary | ICD-10-CM

## 2024-01-22 DIAGNOSIS — E785 Hyperlipidemia, unspecified: Secondary | ICD-10-CM | POA: Diagnosis not present

## 2024-01-22 DIAGNOSIS — Y9301 Activity, walking, marching and hiking: Secondary | ICD-10-CM | POA: Insufficient documentation

## 2024-01-22 DIAGNOSIS — R29707 NIHSS score 7: Secondary | ICD-10-CM | POA: Diagnosis present

## 2024-01-22 DIAGNOSIS — N179 Acute kidney failure, unspecified: Secondary | ICD-10-CM | POA: Diagnosis not present

## 2024-01-22 DIAGNOSIS — R0602 Shortness of breath: Secondary | ICD-10-CM | POA: Diagnosis not present

## 2024-01-22 DIAGNOSIS — F419 Anxiety disorder, unspecified: Secondary | ICD-10-CM | POA: Diagnosis present

## 2024-01-22 DIAGNOSIS — I959 Hypotension, unspecified: Secondary | ICD-10-CM | POA: Diagnosis not present

## 2024-01-22 DIAGNOSIS — Z4682 Encounter for fitting and adjustment of non-vascular catheter: Secondary | ICD-10-CM | POA: Diagnosis not present

## 2024-01-22 DIAGNOSIS — Z79891 Long term (current) use of opiate analgesic: Secondary | ICD-10-CM

## 2024-01-22 DIAGNOSIS — G8190 Hemiplegia, unspecified affecting unspecified side: Secondary | ICD-10-CM | POA: Diagnosis not present

## 2024-01-22 DIAGNOSIS — I629 Nontraumatic intracranial hemorrhage, unspecified: Secondary | ICD-10-CM | POA: Diagnosis not present

## 2024-01-22 DIAGNOSIS — G43909 Migraine, unspecified, not intractable, without status migrainosus: Secondary | ICD-10-CM | POA: Diagnosis present

## 2024-01-22 DIAGNOSIS — K219 Gastro-esophageal reflux disease without esophagitis: Secondary | ICD-10-CM | POA: Diagnosis present

## 2024-01-22 DIAGNOSIS — R509 Fever, unspecified: Secondary | ICD-10-CM | POA: Diagnosis not present

## 2024-01-22 DIAGNOSIS — Z9682 Presence of neurostimulator: Secondary | ICD-10-CM

## 2024-01-22 DIAGNOSIS — J9601 Acute respiratory failure with hypoxia: Secondary | ICD-10-CM | POA: Diagnosis not present

## 2024-01-22 DIAGNOSIS — R270 Ataxia, unspecified: Secondary | ICD-10-CM | POA: Diagnosis present

## 2024-01-22 DIAGNOSIS — R918 Other nonspecific abnormal finding of lung field: Secondary | ICD-10-CM | POA: Diagnosis not present

## 2024-01-22 DIAGNOSIS — I700 Atherosclerosis of aorta: Secondary | ICD-10-CM | POA: Diagnosis not present

## 2024-01-22 DIAGNOSIS — G936 Cerebral edema: Secondary | ICD-10-CM | POA: Diagnosis not present

## 2024-01-22 DIAGNOSIS — G9389 Other specified disorders of brain: Secondary | ICD-10-CM | POA: Diagnosis not present

## 2024-01-22 DIAGNOSIS — G935 Compression of brain: Secondary | ICD-10-CM | POA: Diagnosis not present

## 2024-01-22 DIAGNOSIS — E11649 Type 2 diabetes mellitus with hypoglycemia without coma: Secondary | ICD-10-CM | POA: Diagnosis not present

## 2024-01-22 DIAGNOSIS — R1310 Dysphagia, unspecified: Secondary | ICD-10-CM | POA: Diagnosis not present

## 2024-01-22 LAB — DIFFERENTIAL
Abs Immature Granulocytes: 0.02 10*3/uL (ref 0.00–0.07)
Basophils Absolute: 0 10*3/uL (ref 0.0–0.1)
Basophils Relative: 0 %
Eosinophils Absolute: 0.2 10*3/uL (ref 0.0–0.5)
Eosinophils Relative: 3 %
Immature Granulocytes: 0 %
Lymphocytes Relative: 19 %
Lymphs Abs: 1.2 10*3/uL (ref 0.7–4.0)
Monocytes Absolute: 0.6 10*3/uL (ref 0.1–1.0)
Monocytes Relative: 9 %
Neutro Abs: 4.4 10*3/uL (ref 1.7–7.7)
Neutrophils Relative %: 69 %

## 2024-01-22 LAB — CBC
HCT: 42.6 % (ref 39.0–52.0)
Hemoglobin: 14 g/dL (ref 13.0–17.0)
MCH: 29.9 pg (ref 26.0–34.0)
MCHC: 32.9 g/dL (ref 30.0–36.0)
MCV: 91 fL (ref 80.0–100.0)
Platelets: 117 10*3/uL — ABNORMAL LOW (ref 150–400)
RBC: 4.68 MIL/uL (ref 4.22–5.81)
RDW: 13.1 % (ref 11.5–15.5)
WBC: 6.4 10*3/uL (ref 4.0–10.5)
nRBC: 0 % (ref 0.0–0.2)

## 2024-01-22 LAB — COMPREHENSIVE METABOLIC PANEL
ALT: 19 U/L (ref 0–44)
AST: 17 U/L (ref 15–41)
Albumin: 3.6 g/dL (ref 3.5–5.0)
Alkaline Phosphatase: 87 U/L (ref 38–126)
Anion gap: 8 (ref 5–15)
BUN: 15 mg/dL (ref 8–23)
CO2: 27 mmol/L (ref 22–32)
Calcium: 8.7 mg/dL — ABNORMAL LOW (ref 8.9–10.3)
Chloride: 100 mmol/L (ref 98–111)
Creatinine, Ser: 1.12 mg/dL (ref 0.61–1.24)
GFR, Estimated: >60 mL/min (ref >60)
Glucose, Bld: 156 mg/dL — ABNORMAL HIGH (ref 70–99)
Potassium: 4.3 mmol/L (ref 3.5–5.1)
Sodium: 135 mmol/L (ref 135–145)
Total Bilirubin: 0.6 mg/dL (ref 0.0–1.2)
Total Protein: 7.1 g/dL (ref 6.5–8.1)

## 2024-01-22 LAB — CBG MONITORING, ED: Glucose-Capillary: 120 mg/dL — ABNORMAL HIGH (ref 70–99)

## 2024-01-22 LAB — PROTIME-INR
INR: 1 (ref 0.8–1.2)
Prothrombin Time: 13.6 s (ref 11.4–15.2)

## 2024-01-22 LAB — MRSA NEXT GEN BY PCR, NASAL: MRSA by PCR Next Gen: NOT DETECTED

## 2024-01-22 LAB — APTT: aPTT: 33 s (ref 24–36)

## 2024-01-22 LAB — ETHANOL: Alcohol, Ethyl (B): <10 mg/dL (ref <10)

## 2024-01-22 MED ORDER — SODIUM CHLORIDE 0.9% FLUSH
3.0000 mL | Freq: Once | INTRAVENOUS | Status: AC
  Administered 2024-01-22: 3 mL via INTRAVENOUS

## 2024-01-22 MED ORDER — SENNOSIDES-DOCUSATE SODIUM 8.6-50 MG PO TABS: 1.0000 | ORAL_TABLET | Freq: Two times a day (BID) | ORAL | Status: DC

## 2024-01-22 MED ORDER — ACETAMINOPHEN 160 MG/5ML PO SOLN
650.0000 mg | ORAL | Status: DC | PRN
  Administered 2024-01-23 – 2024-01-26 (×3): 650 mg
  Filled 2024-01-22 (×3): qty 20.3

## 2024-01-22 MED ORDER — PANTOPRAZOLE SODIUM 40 MG IV SOLR
40.0000 mg | Freq: Every day | INTRAVENOUS | Status: DC
  Administered 2024-01-22 – 2024-01-24 (×4): 40 mg via INTRAVENOUS
  Filled 2024-01-22 (×4): qty 10

## 2024-01-22 MED ORDER — ORAL CARE MOUTH RINSE: 15.0000 mL | OROMUCOSAL | Status: DC | PRN

## 2024-01-22 MED ORDER — ACETAMINOPHEN 650 MG RE SUPP
650.0000 mg | RECTAL | Status: DC | PRN
  Administered 2024-01-23: 650 mg via RECTAL
  Filled 2024-01-22: qty 1

## 2024-01-22 MED ORDER — MORPHINE SULFATE (PF) 2 MG/ML IV SOLN
2.0000 mg | INTRAVENOUS | Status: DC | PRN
  Administered 2024-01-22: 2 mg via INTRAVENOUS
  Filled 2024-01-22: qty 1

## 2024-01-22 MED ORDER — ONDANSETRON HCL 4 MG/2ML IJ SOLN
4.0000 mg | Freq: Four times a day (QID) | INTRAMUSCULAR | Status: DC | PRN
  Administered 2024-01-22 – 2024-01-23 (×2): 4 mg via INTRAVENOUS
  Filled 2024-01-22: qty 2

## 2024-01-22 MED ORDER — OXYCODONE HCL 5 MG PO TABS
5.0000 mg | ORAL_TABLET | ORAL | Status: DC | PRN
  Filled 2024-01-22: qty 1

## 2024-01-22 MED ORDER — SENNOSIDES-DOCUSATE SODIUM 8.6-50 MG PO TABS
1.0000 | ORAL_TABLET | Freq: Two times a day (BID) | ORAL | Status: DC
  Filled 2024-01-22: qty 1

## 2024-01-22 MED ORDER — LABETALOL HCL 5 MG/ML IV SOLN
10.0000 mg | INTRAVENOUS | Status: DC | PRN
  Administered 2024-01-23: 20 mg via INTRAVENOUS
  Administered 2024-01-24 (×2): 10 mg via INTRAVENOUS
  Administered 2024-01-26: 20 mg via INTRAVENOUS
  Administered 2024-01-26 – 2024-01-27 (×3): 10 mg via INTRAVENOUS
  Filled 2024-01-22 (×7): qty 4

## 2024-01-22 MED ORDER — STROKE: EARLY STAGES OF RECOVERY BOOK: Freq: Once | Status: DC

## 2024-01-22 MED ORDER — ONDANSETRON HCL 4 MG/2ML IJ SOLN
INTRAMUSCULAR | Status: AC
  Filled 2024-01-22: qty 2

## 2024-01-22 MED ORDER — CLEVIDIPINE BUTYRATE 0.5 MG/ML IV EMUL
0.0000 mg/h | INTRAVENOUS | Status: DC
  Administered 2024-01-22: 6 mg/h via INTRAVENOUS
  Administered 2024-01-22: 2 mg/h via INTRAVENOUS
  Filled 2024-01-22 (×2): qty 50

## 2024-01-22 MED ORDER — HYDRALAZINE HCL 20 MG/ML IJ SOLN
5.0000 mg | Freq: Four times a day (QID) | INTRAMUSCULAR | Status: DC | PRN
  Administered 2024-01-24 – 2024-01-26 (×2): 10 mg via INTRAVENOUS
  Filled 2024-01-22 (×2): qty 1

## 2024-01-22 MED ORDER — STROKE: EARLY STAGES OF RECOVERY BOOK
Freq: Once | Status: AC
  Filled 2024-01-22: qty 1

## 2024-01-22 MED ORDER — ACETAMINOPHEN 325 MG PO TABS
650.0000 mg | ORAL_TABLET | ORAL | Status: DC | PRN
  Administered 2024-01-29: 650 mg via ORAL
  Filled 2024-01-22: qty 2

## 2024-01-22 MED ORDER — IOHEXOL 350 MG/ML SOLN
75.0000 mL | Freq: Once | INTRAVENOUS | Status: AC | PRN
Start: 2024-01-22 — End: 2024-01-22
  Administered 2024-01-22: 75 mL via INTRAVENOUS

## 2024-01-22 MED ORDER — CHLORHEXIDINE GLUCONATE CLOTH 2 % EX PADS
6.0000 | MEDICATED_PAD | Freq: Every day | CUTANEOUS | Status: DC
  Administered 2024-01-22 – 2024-01-28 (×6): 6 via TOPICAL

## 2024-01-22 MED ORDER — PANTOPRAZOLE SODIUM 40 MG IV SOLR
40.0000 mg | Freq: Every day | INTRAVENOUS | Status: DC
Start: 2024-01-22 — End: 2024-01-22

## 2024-01-22 MED ORDER — CLEVIDIPINE BUTYRATE 0.5 MG/ML IV EMUL
0.0000 mg/h | INTRAVENOUS | Status: DC
  Administered 2024-01-22: 17 mg/h via INTRAVENOUS
  Administered 2024-01-22: 16 mg/h via INTRAVENOUS
  Administered 2024-01-22: 8 mg/h via INTRAVENOUS
  Administered 2024-01-22: 17 mg/h via INTRAVENOUS
  Administered 2024-01-22: 16 mg/h via INTRAVENOUS
  Administered 2024-01-22: 21 mg/h via INTRAVENOUS
  Administered 2024-01-23: 19 mg/h via INTRAVENOUS
  Administered 2024-01-23: 16 mg/h via INTRAVENOUS
  Administered 2024-01-23: 21 mg/h via INTRAVENOUS
  Filled 2024-01-22 (×2): qty 50
  Filled 2024-01-22: qty 100
  Filled 2024-01-22 (×2): qty 50
  Filled 2024-01-22 (×3): qty 100

## 2024-01-22 MED ORDER — LABETALOL HCL 5 MG/ML IV SOLN
10.0000 mg | INTRAVENOUS | Status: DC | PRN
  Administered 2024-01-22: 10 mg via INTRAVENOUS
  Filled 2024-01-22: qty 4

## 2024-01-22 MED ORDER — LABETALOL HCL 5 MG/ML IV SOLN
20.0000 mg | Freq: Once | INTRAVENOUS | Status: AC
  Administered 2024-01-22: 20 mg via INTRAVENOUS
  Filled 2024-01-22: qty 4

## 2024-01-22 MED ORDER — SODIUM CHLORIDE 0.9 % IV SOLN: INTRAVENOUS | Status: DC

## 2024-01-22 MED ORDER — HYDRALAZINE HCL 20 MG/ML IJ SOLN: 10.0000 mg | Freq: Four times a day (QID) | INTRAMUSCULAR | Status: DC | PRN

## 2024-01-22 NOTE — ED Triage Notes (Signed)
Patient to ED via [facility] from home for CODE STROKE. Pt's LWK 0300- pt woke up and was walking around when he felt weak on the left side with difficulty swallowing. Pt fell during this time on his left hip onto a pillow. Was found by roommate around 0700. Denies hitting head during fall. No blood thinners or LOC.   155 cbg with EMS EMS unable to obtain BP due to tremors.

## 2024-01-22 NOTE — Progress Notes (Signed)
Delay with MRI... Patient has a Best boy and doesn't have the remote. Patient must the remote and device fully charged before proceeding with the MRI and it must be able to go into MRI mode. Patient's spouse states that he's not sure if the device works because they haven't used it in years.

## 2024-01-22 NOTE — ED Notes (Signed)
EMTALA and transfer consent verified by this RN

## 2024-01-22 NOTE — Progress Notes (Signed)
CODE STROKE- PHARMACY COMMUNICATION  Time CODE STROKE called/page received:01/22/2024 @ 0756  Time response to CODE STROKE was made in person   Time Stroke Kit retrieved from [employer] (only if needed): Outside the window, last known well 3 AM, bleed  Name of Provider/Nurse contacted: Dr. Selina Cooley   Past Medical History:  Diagnosis Date   Anxiety    Arthritis    Cancer ([facility])    squamous cell CA removed from skin   Depression    Diabetes mellitus without complication ([facility])    GERD (gastroesophageal reflux disease)    rare-no meds   Headache    h/o migraines   Hepatitis    Hep C-pt had injections and pill and pt states he does not have hep c anymore   History of kidney stones    h/o age 72   Hypertension    Mental disorder    Pancreatitis 2012   Personal history of tobacco use, presenting hazards to health 11/07/2015   Prior to Admission medications   Medication Sig Start Date End Date Taking? Authorizing Provider  aspirin 81 MG tablet Take 162 mg by mouth daily.    [provider]  atorvastatin (LIPITOR) 10 MG tablet Take 10 mg by mouth every morning.  02/23/16   [provider]  doxazosin (CARDURA) 4 MG tablet Take 4 mg by mouth at bedtime.    [provider]  FLUoxetine (PROZAC) 20 MG capsule Take 60 mg by mouth every morning.     [provider]  lisinopril (PRINIVIL,ZESTRIL) 40 MG tablet Take 40 mg by mouth every morning.     [provider]  metFORMIN (GLUCOPHAGE) 500 MG tablet Take 500 mg by mouth 2 (two) times daily with a meal.    [provider]  metoprolol succinate (TOPROL-XL) 100 MG 24 hr tablet Take 100 mg by mouth every morning. Take with or immediately following a meal.     [provider]  oxyCODONE (OXY IR/ROXICODONE) 5 MG immediate release tablet Take 5 mg by mouth 5 (five) times daily.    [provider]  tamsulosin (FLOMAX) 0.4 MG CAPS capsule Take 1 capsule (0.4 mg total) by mouth daily.  06/26/20   Rodriguez-Southworth, Nettie Elm, PA-C   Effie Shy, PharmD Pharmacy Resident  01/22/2024 8:12 AM

## 2024-01-22 NOTE — Consult Note (Signed)
NEUROLOGY CONSULT NOTE   Date of service: January 22, 2024 Patient Name: Herbert Chapman MRN:  [id number] DOB:  21-May-1946 Chief Complaint: L sided weakness Requesting Provider: Trinna Post, MD  History of Present Illness   Herbert Chapman is a 78 y.o. male with hx of DM2, migraines, HTN who presents with acute onset L sided weakness. He woke up at 0300 and initially felt fine then developed L sided weakness and subsequently had a fall (soft landing, no injuries, no head trauma). Sx did not improve so he called EMS. On arrival patient had NIHSS = 7 with L NLF flattening, mild LUE and LLE weakness, and prominent L hemineglect. Head CT personal review showed 2.1x2.1x3.1 (7mL) IPH at the R thalamocapsular junction with moderate intraventricular extension. No hydrocephalus. Patient SBP >200, was given 20mg  labetalol and started on clevidipine gtt. He was not on anticoagulation prior to admission.  CTA findings showed no e/o aneurysm or vascular malformation but did show multifocal stenoses as follows:  1. The common carotid and internal carotid arteries are patent within the neck. Atherosclerotic plaque bilaterally. Most notably, there are severe (greater than 90%) stenoses at the bilateral internal carotid artery origins/proximal internal carotid arteries. 2. The vertebral arteries are patent within the neck without stenosis. Nonstenotic plaque at the left vertebral artery origin. 3.  Aortic Atherosclerosis (ICD10-I70.0).   CTA head:   1. No evidence of an aneurysm or AVM in the region of the acute parenchymal hemorrhage. 2. Intracranial atherosclerotic disease with multifocal stenoses, most notably as follows. 3. Severe near occlusive stenoses within the right PCA P2 and P3 segments. 4. Severe near occlusive stenosis within the left PCA P3 segment. 5. Up to moderate stenosis within the right ICA cavernous segment and paraclinoid left ICA.  LKW: 0300 Modified rankin score: 0-Completely  asymptomatic and back to baseline post- stroke IV Thrombolysis: No, ICH  ICH Score:1  NIHSS components Score: Comment  1a Level of Conscious 0[x]  1[]  2[]  3[]      1b LOC Questions 0[]  1[x]  2[]       1c LOC Commands 0[x]  1[]  2[]       2 Best Gaze 0[x]  1[]  2[]       3 Visual 0[x]  1[]  2[]  3[]      4 Facial Palsy 0[]  1[x]  2[]  3[]      5a Motor Arm - left 0[]  1[x]  2[]  3[]  4[]  UN[]    5b Motor Arm - Right 0[x]  1[]  2[]  3[]  4[]  UN[]    6a Motor Leg - Left 0[]  1[x]  2[]  3[]  4[]  UN[]    6b Motor Leg - Right 0[x]  1[]  2[]  3[]  4[]  UN[]    7 Limb Ataxia 0[x]  1[]  2[]  3[]  UN[]     8 Sensory 0[x]  1[]  2[]  UN[]      9 Best Language 0[x]  1[]  2[]  3[]      10 Dysarthria 0[]  1[x]  2[]  UN[]      11 Extinct. and Inattention 0[]  1[]  2[x]       TOTAL:  7      ROS   Comprehensive ROS performed and pertinent positives documented in HPI   Past History   Past Medical History:  Diagnosis Date   Anxiety    Arthritis    Cancer (HCC)    squamous cell CA removed from skin   Depression    Diabetes mellitus without complication (HCC)    GERD (gastroesophageal reflux disease)    rare-no meds   Headache    h/o migraines   Hepatitis    Hep C-pt had injections and pill and  pt states he does not have hep c anymore   History of kidney stones    h/o age 56   Hypertension    Mental disorder    Pancreatitis 2012   Personal history of tobacco use, presenting hazards to health 11/07/2015    Past Surgical History:  Procedure Laterality Date   CERVICAL SPINE SURGERY     CIRCUMCISION     COLONOSCOPY     EYE SURGERY     bilateral cataract removal; implants   LUMBAR LAMINECTOMY/DECOMPRESSION MICRODISCECTOMY  10/07/2012   Procedure: LUMBAR LAMINECTOMY/DECOMPRESSION MICRODISCECTOMY 2 LEVELS;  Surgeon: Tia Alert, MD;  Location: [facility];  Service: Neurosurgery;  Laterality: Left;  Left Lumbar two-three,lumbar four-five hemilaminectomy   PILONIDAL CYST EXCISION     SPINAL CORD STIMULATOR IMPLANT  2018   [employer]    TONSILLECTOMY     TOTAL SHOULDER ARTHROPLASTY Right 10/13/2019   Procedure: TOTAL SHOULDER ARTHROPLASTY;  Surgeon: Lyndle Herrlich, MD;  Location: [facility];  Service: Orthopedics;  Laterality: Right;    Family History: No family history on file.  Social History  reports that he quit smoking about 5 years ago. His smoking use included cigarettes. He started smoking about 35 years ago. He has a 30 pack-year smoking history. He has never used smokeless tobacco. He reports that he does not currently use alcohol. He reports that he does not use drugs.  No Known Allergies  Medications   Current Facility-Administered Medications:    iohexol (OMNIPAQUE) 350 MG/ML injection 75 mL, 75 mL, Intravenous, Once PRN, Ray, Neha, MD   sodium chloride flush (NS) 0.9 % injection 3 mL, 3 mL, Intravenous, Once, Ray, Neha, MD  Current Outpatient Medications:    aspirin 81 MG tablet, Take 162 mg by mouth daily., Disp: , Rfl:    atorvastatin (LIPITOR) 10 MG tablet, Take 10 mg by mouth every morning. , Disp: , Rfl:    doxazosin (CARDURA) 4 MG tablet, Take 4 mg by mouth at bedtime., Disp: , Rfl:    FLUoxetine (PROZAC) 20 MG capsule, Take 60 mg by mouth every morning. , Disp: , Rfl:    lisinopril (PRINIVIL,ZESTRIL) 40 MG tablet, Take 40 mg by mouth every morning. , Disp: , Rfl:    metFORMIN (GLUCOPHAGE) 500 MG tablet, Take 500 mg by mouth 2 (two) times daily with a meal., Disp: , Rfl:    metoprolol succinate (TOPROL-XL) 100 MG 24 hr tablet, Take 100 mg by mouth every morning. Take with or immediately following a meal. , Disp: , Rfl:    oxyCODONE (OXY IR/ROXICODONE) 5 MG immediate release tablet, Take 5 mg by mouth 5 (five) times daily., Disp: , Rfl:    tamsulosin (FLOMAX) 0.4 MG CAPS capsule, Take 1 capsule (0.4 mg total) by mouth daily., Disp: 14 capsule, Rfl: 0  Vitals  There were no vitals filed for this visit.  There is no height or weight on file to calculate BMI.  Physical Exam   Gen: patient lying  in bed, NAD CV: extremities appear well-perfused Resp: normal WOB  Neurologic Examination   MS: alert, oriented x4 but stated age incorrectly, follows commands Speech: mild dysarthria, no aphasia CN: PERRL, VFF, EOMI, sensation intact, L NLF flattening, hearing intact to voice Motor: Drift but not to bed LUE and LLE; no drift on R Sensory: SILT. L sensory hemineglect Reflexes: 2+ symm with toes down bilat Coordination: FNF intact bilat Gait: deferred  Labs/Imaging/Neurodiagnostic studies   CBC: No results for input(s): "WBC", "NEUTROABS", "  HGB", "HCT", "MCV", "PLT" in the last 168 hours. Basic Metabolic Panel:  Lab Results  Component Value Date   NA 138 10/08/2019   K 4.5 10/08/2019   CO2 27 10/08/2019   GLUCOSE 100 (H) 10/08/2019   BUN 18 10/08/2019   CREATININE 1.09 10/08/2019   CALCIUM 9.6 10/08/2019   GFRNONAA >60 10/08/2019   GFRAA >60 10/08/2019   Lipid Panel: No results found for: "LDLCALC" HgbA1c: No results found for: "HGBA1C" Urine Drug Screen: No results found for: "LABOPIA", "COCAINSCRNUR", "LABBENZ", "AMPHETMU", "THCU", "LABBARB"  Alcohol Level No results found for: "ETH" INR  Lab Results  Component Value Date   INR 1.1 10/08/2019   APTT  Lab Results  Component Value Date   APTT 29 10/08/2019   AED levels: No results found for: "PHENYTOIN", "ZONISAMIDE", "LAMOTRIGINE", "LEVETIRACETA"  CT Head without contrast(Personally reviewed): 1. 2.1 x 2.1 x 3.1 cm (7 mL) acute parenchymal hemorrhage centered at the right thalamocapsular junction with mild surrounding edema. Moderate volume intraventricular extension of hemorrhage into the right greater than left lateral ventricles, third ventricle and fourth ventricle. No evidence of hydrocephalus. 2. Background parenchymal atrophy and chronic small vessel ischemic disease.  CT angio Head with contrast(Personally reviewed):  CTA neck:   1. The common carotid and internal carotid arteries are patent within  the neck. Atherosclerotic plaque bilaterally. Most notably, there are severe (greater than 90%) stenoses at the bilateral internal carotid artery origins/proximal internal carotid arteries. 2. The vertebral arteries are patent within the neck without stenosis. Nonstenotic plaque at the left vertebral artery origin. 3.  Aortic Atherosclerosis (ICD10-I70.0).   CTA head:   1. No evidence of an aneurysm or AVM in the region of the acute parenchymal hemorrhage. 2. Intracranial atherosclerotic disease with multifocal stenoses, most notably as follows. 3. Severe near occlusive stenoses within the right PCA P2 and P3 segments. 4. Severe near occlusive stenosis within the left PCA P3 segment. 5. Up to moderate stenosis within the right ICA cavernous segment and paraclinoid left ICA.   ASSESSMENT   TYDE LAMISON is a 78 y.o. male with hx of DM2, migraines, HTN who presents with acute onset L sided weakness and was found to have a R thalamocapsular intraparenchymal hemorrhage with intraventricular extension (no hydrocephalus). CTA showed no aneurysm or vascular malformation but did show multifocal stenoses, see above. SBP > 200 on arrival, suspect IPH is hypertensive in etiology.   RECOMMENDATIONS   - Admit to 4N neuro-ICU at [facility] under Dr. Marchelle Folks (STAT transfer) - Clevidipine for goal SBP <150 - SCDs for DVT prophylaxis - Head CT in 6 hrs assess stability of ICH - HOB elevated 30 degrees - Will consider hypertonic saline 3% if hemorrhage expanded on 6 hr scan - Patient is not on anticoagulation, nothing to reverse - Tele - Vitals and NIHSS q 1 hr (more frequently if BP above goal and you are titrating clevidipine) - Please contact me for any questions or concerns before patient is transferred - If there are no beds in 4N neuro ICU contact me to discuss ED to ED transfer   This patient is critically ill and at significant risk of neurological worsening, death and care requires  constant monitoring of vital signs, hemodynamics,respiratory and cardiac monitoring, neurological assessment, discussion with family, other specialists and medical decision making of high complexity. I spent 90 minutes of neurocritical care time  in the care of  this patient. This was time spent independent of any time provided by nurse practitioner  or PA.   Bing Neighbors, MD [facility] [phone]   If 7pm- 7am, please page neurology on call as listed in AMION.

## 2024-01-22 NOTE — ED Provider Notes (Signed)
[facility] Provider Note    Event Date/Time   First MD Initiated Contact with Patient 01/22/24 [phone]     (approximate)   History   Code Stroke   HPI  Herbert Chapman is a 78 year old male with history of diabetes, hypertension presenting to the emergency department for evaluation of weakness as a code stroke.  Around 3 AM, patient woke up which is reportedly not unusual for him.  Shortly after waking he began to feel generally weak and fell onto his left hip hitting his pillow.  Denies hitting his head.  No LOC.  Takes baby aspirin, no other anticoagulation.  Around 7 AM he was found by his roommate with left-sided weakness.  Possible speech difficulty noted prior to EMS evaluation.  BGL within normal limits, EMS unable to obtain blood pressure.    Physical Exam   Triage Vital Signs: ED Triage Vitals  Encounter Vitals Group     BP 01/22/24 0825 (!) 200/169     Systolic BP Percentile --      Diastolic BP Percentile --      Pulse Rate 01/22/24 0825 69     Resp 01/22/24 0825 18     Temp 01/22/24 0827 98.5 F (36.9 C)     Temp Source 01/22/24 0827 Oral     SpO2 01/22/24 0825 97 %     Weight --      Height --      Head Circumference --      Peak Flow --      Pain Score 01/22/24 0826 0     Pain Loc --      Pain Education --      Exclude from Growth Chart --     Most recent vital signs: Vitals:   01/22/24 1155 01/22/24 1200  BP: (!) 147/65 (!) 143/72  Pulse: 64 66  Resp: 19 16  Temp:  99.5 F (37.5 C)  SpO2: 98% 95%     General: Awake, interactive  CV:  Regular rate, good peripheral perfusion.  Resp:  Unlabored respirations. Abd:  Nondistended.  Neuro:  Keenly aware, correctly answers month, states ages 71, normal horizontal extraocular movements, no visual field loss, no facial asymmetry, able to hold left arm antigravity briefly, rapidly dressed and hits the bed, able to hold left lower extremity antigravity for a few seconds, drift hits the  bed, no drift of the right upper and lower extremity.  Does have some inattention to the left side.  No appreciable aphasia or dysarthria.   ED Results / Procedures / Treatments   Labs (all labs ordered are listed, but only abnormal results are displayed) Labs Reviewed  CBC - Abnormal; Notable for the following components:      Result Value   Platelets 117 (*)    All other components within normal limits  COMPREHENSIVE METABOLIC PANEL - Abnormal; Notable for the following components:   Glucose, Bld 156 (*)    Calcium 8.7 (*)    All other components within normal limits  CBG MONITORING, ED - Abnormal; Notable for the following components:   Glucose-Capillary 120 (*)    All other components within normal limits  PROTIME-INR  APTT  DIFFERENTIAL  ETHANOL  I-STAT CREATININE, ED     EKG EKG independently reviewed interpreted by myself (ER attending) demonstrates:  EKG demonstrates sinus rhythm at rate 70, PR 211, QRS 88, QTc 491, no acute ST changes  RADIOLOGY Imaging independently reviewed and interpreted by myself  demonstrates:  CT head on my review demonstrates right-sided intraparenchymal hemorrhage with intraventricular extension, formal radiology read pending CT angio redemonstrates bleed, formal radiology read pending  PROCEDURES:  Critical Care performed: Yes, see critical care procedure note(s)  CRITICAL CARE Performed by: Trinna Post   Total critical care time: 32 minutes  Critical care time was exclusive of separately billable procedures and treating other patients.  Critical care was necessary to treat or prevent imminent or life-threatening deterioration.  Critical care was time spent personally by me on the following activities: development of treatment plan with patient and/or surrogate as well as nursing, discussions with consultants, evaluation of patient's response to treatment, examination of patient, obtaining history from patient or surrogate, ordering and  performing treatments and interventions, ordering and review of laboratory studies, ordering and review of radiographic studies, pulse oximetry and re-evaluation of patient's condition.   Procedures   MEDICATIONS ORDERED IN ED: Medications  clevidipine (CLEVIPREX) infusion 0.5 mg/mL (6 mg/hr Intravenous New Bag/Given 01/22/24 1054)   stroke: early stages of recovery book (has no administration in time range)  senna-docusate (Senokot-S) tablet 1 tablet (1 tablet Oral Patient Refused/Not Given 01/22/24 1054)  pantoprazole (PROTONIX) injection 40 mg (has no administration in time range)  sodium chloride flush (NS) 0.9 % injection 3 mL (3 mLs Intravenous Given 01/22/24 0837)  iohexol (OMNIPAQUE) 350 MG/ML injection 75 mL (75 mLs Intravenous Contrast Given 01/22/24 0821)  labetalol (NORMODYNE) injection 20 mg (20 mg Intravenous Given 01/22/24 0836)     IMPRESSION / MDM / ASSESSMENT AND PLAN / ED COURSE  I reviewed the triage vital signs and the nursing notes.  Differential diagnosis includes, but is not limited to, acute CVA, intracranial bleed, anemia, electrolyte abnormality  Patient's presentation is most consistent with acute presentation with potential threat to life or bodily function.  78 year old male presenting to the ER for evaluation of left-sided weakness as a code stroke.  Hypertensive on presentation.  Accompanied patient to CT where noncontrast CT demonstrated a right IPH with intraventricular extension, later confirmed by radiology.  No recent head trauma, suspect likely atraumatic bleed.  CT angio was obtained to obtain additional information.  Case was reviewed with Dr. Selina Cooley who did recommend transfer to [facility] for further management.  Patient was ordered for IV labetalol and clevidipine drip for blood pressure control per neurology recommendations.  Blood pressure improved to systolics of 140s on clevidipine drip.  Dr. Selina Cooley spoke with Dr.Khaliqdina who accepted the patient in  transfer.  I received a call from radiology that patient's CT angio demonstrated multiple areas of stenosis with areas of near occlusion.  Neurology team updated.  Patient transferred to [facility] for continued management of his intracranial bleed.      FINAL CLINICAL IMPRESSION(S) / ED DIAGNOSES   Final diagnoses:  Intraparenchymal hematoma of brain, right, without loss of consciousness, initial encounter (HCC)     Rx / DC Orders   ED Discharge Orders     None        Note:  This document was prepared using Dragon voice recognition software and may include unintentional dictation errors.   Trinna Post, MD 01/22/24 [phone]

## 2024-01-22 NOTE — Progress Notes (Addendum)
Code stroke cart activated at 0809-Pt already in CT and ER provider at bedside.  Dr. Selina Cooley paged at 0810-and she called me back immediately. Dr. Selina Cooley in CT at [phone].  Ricci Barker, Tele Stroke RN

## 2024-01-22 NOTE — Progress Notes (Signed)
   01/22/24 1300  Spiritual Encounters  Type of Visit Initial  Care provided to: Patient  Referral source Code page  OnCall Visit Yes  Interventions  Spiritual Care Interventions Made Compassionate presence  Intervention Outcomes  Outcomes Connection to spiritual care   Chaplain provided compassionate presence

## 2024-01-22 NOTE — H&P (Signed)
NEUROLOGY H&P NOTE   Date of service: January 22, 2024 Patient Name: Herbert Chapman MRN:  [id number] DOB:  1946/07/28 Chief Complaint: "L sided weakness"  History of Present Illness  AHMED INNISS is a 78 y.o. male with hx of DM2, HTN, Migraines, former smoker who presented to [facility] due to acute onset of L sided weakness. Patient reportedly woke up at 0300 and initially felt fine, then developed the L sided weakness and then fell (no head trauma). He called EMS when symptoms did not improve. On arrival to [facility], NIH was 7, CTH showed R IPH with IVE.SBP was >200, was given labetalol and started on cleviprex gtt. CTA showed no AVM or aneurysm, but did showed multifocal stenosis. Patient was not on any anticoagulation prior.   He was emergently transferred to [facility] for ICU admission.   Last known well: 0300 Modified rankin score: 0-Completely asymptomatic and back to baseline post- stroke ICH Score: 1 tNKASE: Not offered due to ICH Thrombectomy: not offered due to ICH  On [place] admission:  NIHSS components Score: Comment  1a Level of Conscious 0[x]  1[]  2[]  3[]      1b LOC Questions 0[x]  1[]  2[]       1c LOC Commands 0[x]  1[]  2[]       2 Best Gaze 0[x]  1[]  2[]       3 Visual 0[x]  1[]  2[]  3[]      4 Facial Palsy 0[]  1[x]  2[]  3[]      5a Motor Arm - left 0[]  1[]  2[x]  3[]  4[]  UN[]    5b Motor Arm - Right 0[x]  1[]  2[]  3[]  4[]  UN[]    6a Motor Leg - Left 0[]  1[x]  2[]  3[]  4[]  UN[]    6b Motor Leg - Right 0[x]  1[]  2[]  3[]  4[]  UN[]    7 Limb Ataxia 0[x]  1[]  2[]  3[]  UN[]     8 Sensory 0[x]  1[]  2[]  UN[]      9 Best Language 0[x]  1[]  2[]  3[]      10 Dysarthria 0[]  1[x]  2[]  UN[]      11 Extinct. and Inattention 0[]  1[]  2[]       TOTAL:   5    Partner and RN at bedside on neurology evaluation. Plan of care and assessment discussed with patient and partner, [name]. All questions answered.    ROS  Comprehensive ROS performed and pertinent positives documented in the HPI.  Past History   Past Medical  History:  Diagnosis Date   Anxiety    Arthritis    Cancer (HCC)    squamous cell CA removed from skin   Depression    Diabetes mellitus without complication (HCC)    GERD (gastroesophageal reflux disease)    rare-no meds   Headache    h/o migraines   Hepatitis    Hep C-pt had injections and pill and pt states he does not have hep c anymore   History of kidney stones    h/o age 41   Hypertension    Mental disorder    Pancreatitis 2012   Personal history of tobacco use, presenting hazards to health 11/07/2015   Past Surgical History:  Procedure Laterality Date   CERVICAL SPINE SURGERY     CIRCUMCISION     COLONOSCOPY     EYE SURGERY     bilateral cataract removal; implants   LUMBAR LAMINECTOMY/DECOMPRESSION MICRODISCECTOMY  10/07/2012   Procedure: LUMBAR LAMINECTOMY/DECOMPRESSION MICRODISCECTOMY 2 LEVELS;  Surgeon: Tia Alert, MD;  Location: [facility];  Service: Neurosurgery;  Laterality: Left;  Left Lumbar two-three,lumbar  four-five hemilaminectomy   PILONIDAL CYST EXCISION     SPINAL CORD STIMULATOR IMPLANT  2018   [employer]   TONSILLECTOMY     TOTAL SHOULDER ARTHROPLASTY Right 10/13/2019   Procedure: TOTAL SHOULDER ARTHROPLASTY;  Surgeon: Lyndle Herrlich, MD;  Location: [facility] ORS;  Service: Orthopedics;  Laterality: Right;   No family history on file. Social History   Socioeconomic History   Marital status: Single    Spouse name: Not on file   Number of children: Not on file   Years of education: Not on file   Highest education level: Not on file  Occupational History   Not on file  Tobacco Use   Smoking status: Former    Current packs/day: 0.00    Average packs/day: 1 pack/day for 30.0 years (30.0 ttl pk-yrs)    Types: Cigarettes    Start date: 10/07/1988    Quit date: 10/07/2018    Years since quitting: 5.2   Smokeless tobacco: Never  Vaping Use   Vaping status: Never Used  Substance and Sexual Activity   Alcohol use: Not Currently   Drug  use: No   Sexual activity: Not on file  Other Topics Concern   Not on file  Social History Narrative   Not on file   Social Drivers of Health   Financial Resource Strain: Not on file  Food Insecurity: Not on file  Transportation Needs: Not on file  Physical Activity: Not on file  Stress: Not on file  Social Connections: Not on file   No Known Allergies  Medications   Medications Prior to Admission  Medication Sig Dispense Refill Last Dose/Taking   aspirin 81 MG tablet Take 162 mg by mouth daily.      atorvastatin (LIPITOR) 10 MG tablet Take 10 mg by mouth every morning.       doxazosin (CARDURA) 4 MG tablet Take 4 mg by mouth at bedtime.      FLUoxetine (PROZAC) 20 MG capsule Take 60 mg by mouth every morning.       lisinopril (PRINIVIL,ZESTRIL) 40 MG tablet Take 40 mg by mouth every morning.       metFORMIN (GLUCOPHAGE) 500 MG tablet Take 500 mg by mouth 2 (two) times daily with a meal.      metoprolol succinate (TOPROL-XL) 100 MG 24 hr tablet Take 100 mg by mouth every morning. Take with or immediately following a meal.       oxyCODONE (OXY IR/ROXICODONE) 5 MG immediate release tablet Take 5 mg by mouth 5 (five) times daily.      tamsulosin (FLOMAX) 0.4 MG CAPS capsule Take 1 capsule (0.4 mg total) by mouth daily. 14 capsule 0      Vitals   Vitals:   01/22/24 1300 01/22/24 1315 01/22/24 1321  BP: (!) 157/113 (!) 174/79 (!) 148/70  Pulse: 74 83 70  Resp: 16    SpO2: 98% 97% 96%     There is no height or weight on file to calculate BMI.  Physical Exam   Constitutional: Appears well-developed and well-nourished.  Psych: Affect appropriate to situation.  Eyes: No scleral injection.  HENT: No OP obstruction.  Head: Normocephalic.  Cardiovascular: Normal rate and regular rhythm. Respiratory: Effort normal, non-labored breathing.  GI: Soft.  No distension. There is no tenderness.  Skin: WDI.   Neurologic Examination   Patient is awake, alert and oriented. He is  able to give clear history.  Good attention and answers all questions appropriately.  Follows  all commands.  Left facial droop Spontaneous movement in all extremities.  LUE: Moderate drift present with reduced coordination and control LLE: Mild drift present No weakness in RUE/RLE noted.  Ataxia noted in LUE.   Labs   CBC:  Recent Labs  Lab 01/22/24 0832  WBC 6.4  NEUTROABS 4.4  HGB 14.0  HCT 42.6  MCV 91.0  PLT 117*    Basic Metabolic Panel:  Lab Results  Component Value Date   NA 135 01/22/2024   K 4.3 01/22/2024   CO2 27 01/22/2024   GLUCOSE 156 (H) 01/22/2024   BUN 15 01/22/2024   CREATININE 1.12 01/22/2024   CALCIUM 8.7 (L) 01/22/2024   GFRNONAA >60 01/22/2024   GFRAA >60 10/08/2019   Lipid Panel: No results found for: "LDLCALC" HgbA1c: No results found for: "HGBA1C" Urine Drug Screen: No results found for: "LABOPIA", "COCAINSCRNUR", "LABBENZ", "AMPHETMU", "THCU", "LABBARB"  Alcohol Level     Component Value Date/Time   ETH <10 01/22/2024 0832   INR  Lab Results  Component Value Date   INR 1.0 01/22/2024   APTT  Lab Results  Component Value Date   APTT 33 01/22/2024     CT Head without contrast(Personally reviewed): 2.1 x 2.1 x 3.1 cm (7 mL) acute parenchymal hemorrhage centered at the right thalamocapsular junction with mild surrounding edema. Moderate volume intraventricular extension of hemorrhage into the right greater than left lateral ventricles, third ventricle and fourth ventricle. No evidence of hydrocephalus. 2. Background parenchymal atrophy and chronic small vessel ischemic disease.  CT angio Head and Neck with contrast(Personally reviewed): No evidence of an aneurysm or AVM in the region of the acute parenchymal hemorrhage. 2. Intracranial atherosclerotic disease with multifocal stenoses, most notably as follows. 3. Severe near occlusive stenoses within the right PCA P2 and P3 segments. 4. Severe near occlusive stenosis within  the left PCA P3 segment. 5. Up to moderate stenosis within the right ICA cavernous segment and paraclinoid left ICA.  Surveillance CT Head without contrast: PENDING   Impression   KOLEMAN MARLING is a 78 y.o. male with hx of DM2, migraines, HTN who presents with acute onset L sided weakness and was found to have a R thalamocapsular intraparenchymal hemorrhage with intraventricular extension (no hydrocephalus). CTA showed no aneurysm or vascular malformation but did show multifocal stenoses, see above.  Unable to get MRI brain due to patient unable to charge spinal stimulator/does not have remote.   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 hypertensive in etiology  Secondary Diagnosis: Essential (primary) hypertension and Type 2 diabetes mellitus w/o complications  Recommendations   - Goal SBP <150  Cleviprex gtt as needed  Labetalol IVP PRN - SCDs for DVT prophylaxis - No anticoagulation due to IPH - Head CT in 6 hrs assess stability of ICH (ordered) - STAT CT with any worsening of neurological symptoms - HOB elevated 30 degrees - Will consider hypertonic saline 3% if hemorrhage expanded on 6 hr scan - Vitals and NIHSS q 1 hr (more frequently if BP above goal and you are titrating clevidipine)  Stroke team will follow beginning 2/14 AM ______________________________________________________________________    Pt seen by Neuro NP/APP and later by MD. Note/plan to be edited by MD as needed.    Lynnae January, DNP, AGACNP-BC [facility] Please use AMION for contact information & EPIC for messaging.  ATTENDING NOTE: I reviewed above note and agree with the assessment and plan. Pt was seen and examined.  Partner is at the bedside. Pt lying in bed, awake, alert, eyes open, orientated to place, time and people but told me age 36 instead of 64. No aphasia but paucity of language with mild dysarthria, following all simple  commands. Able to name and repeat. No gaze palsy, tracking bilaterally, visual field full, PERRL. Mild left facial droop and tongue protrusion to the left. RUE and RLE 5/5, LUE 3-/5 with significant ataxia seems out of proportion to the weakness. LLE 3-/5 proximal and knee flexion but distal ankle DF 4/5. Sensation symmetrical bilaterally but not cooperative with b/l simultaneous stimulation, R FTN intact, gait not tested.   Etiology for pt right BG ICH likely due to stroke risk factors. Repeat CT stable hematoma and IVH, no hydro. OK to relax BP goal < 160. Pending MRI in am. However, CTA head and neck showed b/l ICA bulb severe stenosis as well as multifocal intracranial stenosis, will need outpt follow up with vascular surgery. But currently no antiplatelet for now given ICH. Pt did not pass swallow, on IVF, will need formal speech eval in am.   For detailed assessment and plan, please refer to above/below as I have made changes wherever appropriate.   Marvel Plan, MD PhD Stroke Neurology 01/22/2024 5:27 PM  This patient is critically ill due to ICH, IVH, hypertensive emergency, carotid stenosis and at significant risk of neurological worsening, death form hematoma expansion, hydrocephalus, stroke, encephalopathy. This patient's care requires constant monitoring of vital signs, hemodynamics, respiratory and cardiac monitoring, review of multiple databases, neurological assessment, discussion with family, other specialists and medical decision making of high complexity. I spent 40 minutes of neurocritical care time in the care of this patient. I had long discussion with partner at bedside, updated pt current condition, treatment plan and potential prognosis, and answered all the questions. He expressed understanding and appreciation.

## 2024-01-23 ENCOUNTER — Inpatient Hospital Stay (HOSPITAL_COMMUNITY): Payer: Medicare HMO

## 2024-01-23 ENCOUNTER — Other Ambulatory Visit: Payer: Self-pay

## 2024-01-23 DIAGNOSIS — J9600 Acute respiratory failure, unspecified whether with hypoxia or hypercapnia: Secondary | ICD-10-CM

## 2024-01-23 DIAGNOSIS — I10 Essential (primary) hypertension: Secondary | ICD-10-CM | POA: Diagnosis not present

## 2024-01-23 DIAGNOSIS — E785 Hyperlipidemia, unspecified: Secondary | ICD-10-CM

## 2024-01-23 DIAGNOSIS — R918 Other nonspecific abnormal finding of lung field: Secondary | ICD-10-CM | POA: Diagnosis not present

## 2024-01-23 DIAGNOSIS — J9601 Acute respiratory failure with hypoxia: Secondary | ICD-10-CM

## 2024-01-23 DIAGNOSIS — R0602 Shortness of breath: Secondary | ICD-10-CM | POA: Diagnosis not present

## 2024-01-23 DIAGNOSIS — I619 Nontraumatic intracerebral hemorrhage, unspecified: Secondary | ICD-10-CM | POA: Diagnosis not present

## 2024-01-23 DIAGNOSIS — I6381 Other cerebral infarction due to occlusion or stenosis of small artery: Secondary | ICD-10-CM

## 2024-01-23 DIAGNOSIS — I615 Nontraumatic intracerebral hemorrhage, intraventricular: Secondary | ICD-10-CM | POA: Diagnosis not present

## 2024-01-23 DIAGNOSIS — E119 Type 2 diabetes mellitus without complications: Secondary | ICD-10-CM

## 2024-01-23 DIAGNOSIS — K219 Gastro-esophageal reflux disease without esophagitis: Secondary | ICD-10-CM

## 2024-01-23 DIAGNOSIS — Z4682 Encounter for fitting and adjustment of non-vascular catheter: Secondary | ICD-10-CM | POA: Diagnosis not present

## 2024-01-23 DIAGNOSIS — I700 Atherosclerosis of aorta: Secondary | ICD-10-CM | POA: Diagnosis not present

## 2024-01-23 DIAGNOSIS — Z96611 Presence of right artificial shoulder joint: Secondary | ICD-10-CM | POA: Diagnosis not present

## 2024-01-23 DIAGNOSIS — J984 Other disorders of lung: Secondary | ICD-10-CM | POA: Diagnosis not present

## 2024-01-23 DIAGNOSIS — G919 Hydrocephalus, unspecified: Secondary | ICD-10-CM | POA: Diagnosis not present

## 2024-01-23 LAB — CBC WITH DIFFERENTIAL/PLATELET
Abs Immature Granulocytes: 0.07 10*3/uL (ref 0.00–0.07)
Basophils Absolute: 0 10*3/uL (ref 0.0–0.1)
Basophils Relative: 0 %
Eosinophils Absolute: 0 10*3/uL (ref 0.0–0.5)
Eosinophils Relative: 0 %
HCT: 43.2 % (ref 39.0–52.0)
Hemoglobin: 15 g/dL (ref 13.0–17.0)
Immature Granulocytes: 0 %
Lymphocytes Relative: 4 %
Lymphs Abs: 0.7 10*3/uL (ref 0.7–4.0)
MCH: 30.7 pg (ref 26.0–34.0)
MCHC: 34.7 g/dL (ref 30.0–36.0)
MCV: 88.5 fL (ref 80.0–100.0)
Monocytes Absolute: 1.4 10*3/uL — ABNORMAL HIGH (ref 0.1–1.0)
Monocytes Relative: 7 %
Neutro Abs: 17.7 10*3/uL — ABNORMAL HIGH (ref 1.7–7.7)
Neutrophils Relative %: 89 %
Platelets: 190 10*3/uL (ref 150–400)
RBC: 4.88 MIL/uL (ref 4.22–5.81)
RDW: 13.2 % (ref 11.5–15.5)
WBC: 19.9 10*3/uL — ABNORMAL HIGH (ref 4.0–10.5)
nRBC: 0 % (ref 0.0–0.2)

## 2024-01-23 LAB — POCT I-STAT 7, (LYTES, BLD GAS, ICA,H+H)
Acid-base deficit: 5 mmol/L — ABNORMAL HIGH (ref 0.0–2.0)
Acid-base deficit: 2 mmol/L (ref 0.0–2.0)
Bicarbonate: 18.3 mmol/L — ABNORMAL LOW (ref 20.0–28.0)
Bicarbonate: 21.4 mmol/L (ref 20.0–28.0)
Calcium, Ion: 1.16 mmol/L (ref 1.15–1.40)
Calcium, Ion: 1.15 mmol/L (ref 1.15–1.40)
HCT: 45 % (ref 39.0–52.0)
HCT: 41 % (ref 39.0–52.0)
Hemoglobin: 15.3 g/dL (ref 13.0–17.0)
Hemoglobin: 13.9 g/dL (ref 13.0–17.0)
O2 Saturation: 89 %
O2 Saturation: 100 %
Patient temperature: 101
Patient temperature: 102.5
Potassium: 4.2 mmol/L (ref 3.5–5.1)
Potassium: 4.2 mmol/L (ref 3.5–5.1)
Sodium: 137 mmol/L (ref 135–145)
Sodium: 136 mmol/L (ref 135–145)
TCO2: 19 mmol/L — ABNORMAL LOW (ref 22–32)
TCO2: 22 mmol/L (ref 22–32)
pCO2 arterial: 31.2 mm[Hg] — ABNORMAL LOW (ref 32–48)
pCO2 arterial: 37.2 mm[Hg] (ref 32–48)
pH, Arterial: 7.382 (ref 7.35–7.45)
pH, Arterial: 7.378 (ref 7.35–7.45)
pO2, Arterial: 60 mm[Hg] — ABNORMAL LOW (ref 83–108)
pO2, Arterial: 297 mm[Hg] — ABNORMAL HIGH (ref 83–108)

## 2024-01-23 LAB — MAGNESIUM
Magnesium: 1.7 mg/dL (ref 1.7–2.4)
Magnesium: 1.6 mg/dL — ABNORMAL LOW (ref 1.7–2.4)
Magnesium: 2.4 mg/dL (ref 1.7–2.4)

## 2024-01-23 LAB — PHOSPHORUS
Phosphorus: 3.6 mg/dL (ref 2.5–4.6)
Phosphorus: 3.8 mg/dL (ref 2.5–4.6)

## 2024-01-23 LAB — HEMOGLOBIN A1C
Hgb A1c MFr Bld: 6.6 % — ABNORMAL HIGH (ref 4.8–5.6)
Mean Plasma Glucose: 142.72 mg/dL

## 2024-01-23 LAB — ECHOCARDIOGRAM COMPLETE
Area-P 1/2: 2.42 cm2
Height: 67 in
S' Lateral: 2.2 cm
Weight: 3142.88 [oz_av]

## 2024-01-23 LAB — COMPREHENSIVE METABOLIC PANEL
ALT: 20 U/L (ref 0–44)
AST: 26 U/L (ref 15–41)
Albumin: 3.7 g/dL (ref 3.5–5.0)
Alkaline Phosphatase: 79 U/L (ref 38–126)
Anion gap: 16 — ABNORMAL HIGH (ref 5–15)
BUN: 19 mg/dL (ref 8–23)
CO2: 20 mmol/L — ABNORMAL LOW (ref 22–32)
Calcium: 8.8 mg/dL — ABNORMAL LOW (ref 8.9–10.3)
Chloride: 101 mmol/L (ref 98–111)
Creatinine, Ser: 1.53 mg/dL — ABNORMAL HIGH (ref 0.61–1.24)
GFR, Estimated: 46 mL/min — ABNORMAL LOW (ref >60)
Glucose, Bld: 278 mg/dL — ABNORMAL HIGH (ref 70–99)
Potassium: 4.2 mmol/L (ref 3.5–5.1)
Sodium: 137 mmol/L (ref 135–145)
Total Bilirubin: 1.2 mg/dL (ref 0.0–1.2)
Total Protein: 7.2 g/dL (ref 6.5–8.1)

## 2024-01-23 LAB — TSH: TSH: 0.515 u[IU]/mL (ref 0.350–4.500)

## 2024-01-23 LAB — LIPID PANEL
Cholesterol: 115 mg/dL (ref 0–200)
HDL: 28 mg/dL — ABNORMAL LOW (ref >40)
LDL Cholesterol: 10 mg/dL (ref 0–99)
Total CHOL/HDL Ratio: 4.1 {ratio}
Triglycerides: 386 mg/dL — ABNORMAL HIGH (ref <150)
VLDL: 77 mg/dL — ABNORMAL HIGH (ref 0–40)

## 2024-01-23 LAB — LACTIC ACID, PLASMA
Lactic Acid, Venous: 2.5 mmol/L (ref 0.5–1.9)
Lactic Acid, Venous: 1.9 mmol/L (ref 0.5–1.9)

## 2024-01-23 LAB — TROPONIN I (HIGH SENSITIVITY)
Troponin I (High Sensitivity): 64 ng/L — ABNORMAL HIGH (ref <18)
Troponin I (High Sensitivity): 98 ng/L — ABNORMAL HIGH (ref <18)

## 2024-01-23 LAB — PROTIME-INR
INR: 1.2 (ref 0.8–1.2)
Prothrombin Time: 15 s (ref 11.4–15.2)

## 2024-01-23 LAB — GLUCOSE, CAPILLARY
Glucose-Capillary: 260 mg/dL — ABNORMAL HIGH (ref 70–99)
Glucose-Capillary: 268 mg/dL — ABNORMAL HIGH (ref 70–99)
Glucose-Capillary: 179 mg/dL — ABNORMAL HIGH (ref 70–99)
Glucose-Capillary: 156 mg/dL — ABNORMAL HIGH (ref 70–99)
Glucose-Capillary: 173 mg/dL — ABNORMAL HIGH (ref 70–99)
Glucose-Capillary: 221 mg/dL — ABNORMAL HIGH (ref 70–99)

## 2024-01-23 MED ORDER — FENTANYL CITRATE PF 50 MCG/ML IJ SOSY
PREFILLED_SYRINGE | INTRAMUSCULAR | Status: AC
  Administered 2024-01-23: 50 ug
  Filled 2024-01-23: qty 2

## 2024-01-23 MED ORDER — INSULIN ASPART 100 UNIT/ML IJ SOLN
0.0000 [IU] | INTRAMUSCULAR | Status: DC
  Administered 2024-01-23 (×2): 5 [IU] via SUBCUTANEOUS

## 2024-01-23 MED ORDER — ETOMIDATE 2 MG/ML IV SOLN
INTRAVENOUS | Status: AC
  Administered 2024-01-23: 20 mg
  Filled 2024-01-23: qty 20

## 2024-01-23 MED ORDER — DEXMEDETOMIDINE HCL IN NACL 400 MCG/100ML IV SOLN
0.0000 ug/kg/h | INTRAVENOUS | Status: DC
  Administered 2024-01-23: 0.3 ug/kg/h via INTRAVENOUS
  Administered 2024-01-23: 0.4 ug/kg/h via INTRAVENOUS
  Administered 2024-01-24: 0.3 ug/kg/h via INTRAVENOUS
  Filled 2024-01-23 (×3): qty 100

## 2024-01-23 MED ORDER — ATORVASTATIN CALCIUM 40 MG PO TABS
40.0000 mg | ORAL_TABLET | Freq: Every day | ORAL | Status: DC
  Administered 2024-01-23: 40 mg
  Filled 2024-01-23: qty 1

## 2024-01-23 MED ORDER — SODIUM CHLORIDE 0.9 % IV SOLN: 250.0000 mL | INTRAVENOUS | Status: AC

## 2024-01-23 MED ORDER — ADULT MULTIVITAMIN W/MINERALS CH
1.0000 | ORAL_TABLET | Freq: Every day | ORAL | Status: DC
  Administered 2024-01-23 – 2024-01-29 (×7): 1
  Filled 2024-01-23 (×7): qty 1

## 2024-01-23 MED ORDER — ORAL CARE MOUTH RINSE: 15.0000 mL | OROMUCOSAL | Status: DC | PRN

## 2024-01-23 MED ORDER — ATORVASTATIN CALCIUM 40 MG PO TABS: 40.0000 mg | ORAL_TABLET | Freq: Every day | ORAL | Status: DC

## 2024-01-23 MED ORDER — FENTANYL 2500MCG IN NS 250ML (10MCG/ML) PREMIX INFUSION
0.0000 ug/h | INTRAVENOUS | Status: DC
  Administered 2024-01-23: 25 ug/h via INTRAVENOUS
  Filled 2024-01-23: qty 250

## 2024-01-23 MED ORDER — FENTANYL 2500MCG IN NS 250ML (10MCG/ML) PREMIX INFUSION
25.0000 ug/h | INTRAVENOUS | Status: DC
Start: 2024-01-23 — End: 2024-01-24
  Administered 2024-01-23: 25 ug/h via INTRAVENOUS
  Administered 2024-01-23: 75 ug/h via INTRAVENOUS
  Administered 2024-01-24: 25 ug/h via INTRAVENOUS
  Filled 2024-01-23 (×2): qty 250

## 2024-01-23 MED ORDER — PERFLUTREN LIPID MICROSPHERE
1.0000 mL | INTRAVENOUS | Status: AC | PRN
  Administered 2024-01-23: 4 mL via INTRAVENOUS

## 2024-01-23 MED ORDER — MIDAZOLAM HCL (PF) 10 MG/2ML IJ SOLN
INTRAMUSCULAR | Status: AC
  Administered 2024-01-23: 5 mg
  Filled 2024-01-23: qty 2

## 2024-01-23 MED ORDER — FENTANYL BOLUS VIA INFUSION
25.0000 ug | INTRAVENOUS | Status: DC | PRN
  Administered 2024-01-23: 75 ug via INTRAVENOUS

## 2024-01-23 MED ORDER — NOREPINEPHRINE 4 MG/250ML-% IV SOLN
0.0000 ug/min | INTRAVENOUS | Status: DC
  Administered 2024-01-23: 2 ug/min via INTRAVENOUS
  Filled 2024-01-23: qty 250

## 2024-01-23 MED ORDER — PIPERACILLIN-TAZOBACTAM 3.375 G IVPB
3.3750 g | Freq: Three times a day (TID) | INTRAVENOUS | Status: DC
  Administered 2024-01-23 – 2024-01-24 (×3): 3.375 g via INTRAVENOUS
  Filled 2024-01-23 (×3): qty 50

## 2024-01-23 MED ORDER — SENNOSIDES-DOCUSATE SODIUM 8.6-50 MG PO TABS
1.0000 | ORAL_TABLET | Freq: Two times a day (BID) | ORAL | Status: DC
  Administered 2024-01-23 – 2024-01-24 (×4): 1
  Filled 2024-01-23 (×3): qty 1

## 2024-01-23 MED ORDER — OSMOLITE 1.5 CAL PO LIQD
1000.0000 mL | ORAL | Status: DC
  Administered 2024-01-23 – 2024-01-26 (×4): 1000 mL
  Filled 2024-01-23: qty 1000

## 2024-01-23 MED ORDER — ORAL CARE MOUTH RINSE
15.0000 mL | OROMUCOSAL | Status: DC
  Administered 2024-01-23 – 2024-01-29 (×56): 15 mL via OROMUCOSAL

## 2024-01-23 MED ORDER — LACTATED RINGERS IV BOLUS
1000.0000 mL | Freq: Once | INTRAVENOUS | Status: AC
  Administered 2024-01-23: 1000 mL via INTRAVENOUS

## 2024-01-23 MED ORDER — THIAMINE MONONITRATE 100 MG PO TABS
100.0000 mg | ORAL_TABLET | Freq: Every day | ORAL | Status: AC
  Administered 2024-01-23 – 2024-01-29 (×7): 100 mg
  Filled 2024-01-23 (×7): qty 1

## 2024-01-23 MED ORDER — VITAL HIGH PROTEIN PO LIQD: 1000.0000 mL | ORAL | Status: DC

## 2024-01-23 MED ORDER — INSULIN ASPART 100 UNIT/ML IJ SOLN
0.0000 [IU] | INTRAMUSCULAR | Status: DC
  Administered 2024-01-23: 5 [IU] via SUBCUTANEOUS
  Administered 2024-01-23 (×3): 3 [IU] via SUBCUTANEOUS
  Administered 2024-01-24 (×3): 5 [IU] via SUBCUTANEOUS
  Administered 2024-01-24: 2 [IU] via SUBCUTANEOUS
  Administered 2024-01-24: 5 [IU] via SUBCUTANEOUS
  Administered 2024-01-25: 8 [IU] via SUBCUTANEOUS
  Administered 2024-01-25 (×2): 15 [IU] via SUBCUTANEOUS
  Administered 2024-01-25: 8 [IU] via SUBCUTANEOUS
  Administered 2024-01-25: 11 [IU] via SUBCUTANEOUS
  Administered 2024-01-25: 8 [IU] via SUBCUTANEOUS
  Administered 2024-01-25: 11 [IU] via SUBCUTANEOUS
  Administered 2024-01-26: 5 [IU] via SUBCUTANEOUS
  Administered 2024-01-26 (×3): 8 [IU] via SUBCUTANEOUS
  Administered 2024-01-26: 3 [IU] via SUBCUTANEOUS
  Administered 2024-01-27: 8 [IU] via SUBCUTANEOUS
  Administered 2024-01-27: 3 [IU] via SUBCUTANEOUS
  Administered 2024-01-27: 5 [IU] via SUBCUTANEOUS
  Administered 2024-01-27: 8 [IU] via SUBCUTANEOUS
  Administered 2024-01-27 (×2): 3 [IU] via SUBCUTANEOUS
  Administered 2024-01-28: 8 [IU] via SUBCUTANEOUS
  Administered 2024-01-28: 5 [IU] via SUBCUTANEOUS
  Administered 2024-01-28: 2 [IU] via SUBCUTANEOUS
  Administered 2024-01-28: 8 [IU] via SUBCUTANEOUS
  Administered 2024-01-28: 5 [IU] via SUBCUTANEOUS
  Administered 2024-01-28: 8 [IU] via SUBCUTANEOUS
  Administered 2024-01-29: 3 [IU] via SUBCUTANEOUS
  Administered 2024-01-29: 8 [IU] via SUBCUTANEOUS
  Administered 2024-01-29: 5 [IU] via SUBCUTANEOUS
  Administered 2024-01-29: 8 [IU] via SUBCUTANEOUS
  Administered 2024-01-29: 5 [IU] via SUBCUTANEOUS

## 2024-01-23 MED ORDER — IPRATROPIUM-ALBUTEROL 0.5-2.5 (3) MG/3ML IN SOLN: 3.0000 mL | RESPIRATORY_TRACT | Status: DC | PRN

## 2024-01-23 MED ORDER — PROSOURCE TF20 ENFIT COMPATIBL EN LIQD
60.0000 mL | Freq: Every day | ENTERAL | Status: DC
Start: 2024-01-23 — End: 2024-01-23
  Administered 2024-01-23: 60 mL
  Filled 2024-01-23: qty 60

## 2024-01-23 MED ORDER — PIPERACILLIN-TAZOBACTAM 3.375 G IVPB 30 MIN
3.3750 g | Freq: Once | INTRAVENOUS | Status: AC
  Administered 2024-01-23: 3.375 g via INTRAVENOUS
  Filled 2024-01-23 (×2): qty 50

## 2024-01-23 MED ORDER — MAGNESIUM SULFATE 2 GM/50ML IV SOLN
2.0000 g | Freq: Once | INTRAVENOUS | Status: AC
  Administered 2024-01-23: 2 g via INTRAVENOUS
  Filled 2024-01-23: qty 50

## 2024-01-23 MED ORDER — PROPOFOL 1000 MG/100ML IV EMUL
5.0000 ug/kg/min | INTRAVENOUS | Status: DC
Start: 2024-01-23 — End: 2024-01-23
  Administered 2024-01-23: 5 ug/kg/min via INTRAVENOUS
  Filled 2024-01-23: qty 100

## 2024-01-23 MED ORDER — PROSOURCE TF20 ENFIT COMPATIBL EN LIQD
60.0000 mL | Freq: Two times a day (BID) | ENTERAL | Status: DC
  Administered 2024-01-23 – 2024-01-27 (×8): 60 mL
  Filled 2024-01-23 (×8): qty 60

## 2024-01-23 MED ORDER — IPRATROPIUM-ALBUTEROL 0.5-2.5 (3) MG/3ML IN SOLN
3.0000 mL | Freq: Four times a day (QID) | RESPIRATORY_TRACT | Status: DC
  Administered 2024-01-23: 3 mL via RESPIRATORY_TRACT
  Filled 2024-01-23: qty 3

## 2024-01-23 MED ORDER — FENTANYL CITRATE PF 50 MCG/ML IJ SOSY
25.0000 ug | PREFILLED_SYRINGE | Freq: Once | INTRAMUSCULAR | Status: AC
  Administered 2024-01-23: 25 ug via INTRAVENOUS
  Filled 2024-01-23: qty 1

## 2024-01-23 MED ORDER — MIDAZOLAM HCL 2 MG/2ML IJ SOLN
INTRAMUSCULAR | Status: AC
  Filled 2024-01-23: qty 2

## 2024-01-23 NOTE — Consult Note (Signed)
NAME:  Herbert Chapman, MRN:  [id number], DOB:  July 12, 1946, LOS: 1 ADMISSION DATE:  01/22/2024 CONSULTATION DATE:  01/23/2024 REFERRING MD:  Amada Jupiter - Neuro, CHIEF COMPLAINT:  Respiratory distress s/p R IPH  History of Present Illness:  78 year old man who presented to [facility] 2/13 as a transfer from [facility] for L-sided weakness. Code Stroke called. PMHx significant for HTN, T2DM, pancreatitis, Hepatitis C (s/p treatment with PEG-Intron/ribavirin), GERD, former tobacco use, migraines, anxiety/depression.  Patient initially presented to [facility] as a transfer from [facility] for acute onset of L-sided weakness; Code Stroke was called. LKW 0300 2/13 at which time patient had a fall without head trauma. CT Head demonstrated 2.1 x 2.1 x 3.1cm (7mL) acute APH of R thalamocapsular junction with mild surrounding edema. CTA Head/Neck negative for LVO or vascular malformation but demonstrating intracranial atherosclerotic disease with multifocal stenoses (right PCA P2-3, L PCA P3, R cavernous ICA, paraclinoid L ICA). Cleviprex was started for BP control.  Repeat CT Head 2/13 without significant change in R thalamic IPH, slight increase in IVH. On 2/14AM, patient was noted to have increased dysarthria and confusion with worsening L-sided hemiparesis. Repeat CT Head was completed demonstrating mild enlargement of IPH and mild hydrocephalus. Patient continued to decompensate from a respiratory standpoint with concern for aspiration; he became tachypneic and hypoxic with markedly increased O2 requirements and PCCM was consulted for evaluation. He was subsequently intubated for further airway protection.  PCCM to follow for ventilator management.  Pertinent Medical History:   Past Medical History:  Diagnosis Date   Anxiety    Arthritis    Cancer (HCC)    squamous cell CA removed from skin   Depression    Diabetes mellitus without complication (HCC)    GERD (gastroesophageal reflux disease)    rare-no meds   Headache     h/o migraines   Hepatitis    Hep C-pt had injections and pill and pt states he does not have hep c anymore   History of kidney stones    h/o age 89   Hypertension    Mental disorder    Pancreatitis 2012   Personal history of tobacco use, presenting hazards to health 11/07/2015   Significant Hospital Events: Including procedures, antibiotic start and stop dates in addition to other pertinent events   2/13 - Presented to [facility] as a transfer from [facility] for L-sided weakness, Code Stroke. CT Head with R thalamic IPH. Repeat CT Head later in the day grossly unchanged. 2/14 - Early AM, increased dysarthria/confusion and markedly increased O2 requirements. Concern for aspiration. PCCM called for assessment; patient was subsequently intubated.  Interim History / Subjective:  PCCM consulted for increasing respiratory needs/respiratory distress. Increased WOB with tachypnea to 30s-40s, up to [place] 10L Intubated for worsening respiratory status  Objective:  Blood pressure (!) 155/57, pulse 94, temperature (!) 101 F (38.3 C), temperature source Oral, resp. rate 19, height 5\' 7"  (1.702 m), weight 89.1 kg, SpO2 95%.        Intake/Output Summary (Last 24 hours) at 01/23/2024 0443 Last data filed at 01/23/2024 0400 Gross per 24 hour  Intake 1005.68 ml  Output 601 ml  Net 404.68 ml   Filed Weights   01/22/24 1321  Weight: 89.1 kg   Physical Examination: General: Acutely ill-appearing older man in NAD. Intermittently agitated. HEENT: [place]/AT, anicteric sclera, PERRL 3mm, moist mucous membranes. Neuro:  Intubated, sedated.  Responds to noxious stimuli. Withdraws to pain in all 4 extremities, R > L.Not following commands. Moves  all 4 extremities spontaneously. +Corneal, +Cough, and +Gag  CV: RRR, no m/g/r. PULM: Breathing even and unlabored on vent (PEEP 5, FiO2 100%). Lung fields diminished throughout. GI: Soft, nontender, nondistended. Normoactive bowel sounds. Extremities: No LE edema  noted. Skin: Warm/dry, no rashes.  Resolved Hospital Problem List:    Assessment & Plan:  Acute R thalamic intraparenchymal hemorrhage, suspect hypertensive CT Head demonstrated 2.1 x 2.1 x 3.1cm (7mL) acute APH of R thalamocapsular junction with mild surrounding edema. CTA Head/Neck negative for LVO or vascular malformation but demonstrating intracranial atherosclerotic disease with multifocal stenoses. - Stroke team primary - Goal SBP < 160 - Cleviprex titrated to goal SBP, labetalol PRN - F/u MRI Brain once spinal stimulator remote able to be obtained - F/u Echo, lipid panel, A1c - Frequent neuro checks - Neuroprotective measures: HOB > 30 degrees, normoglycemia, normothermia, electrolytes WNL - PT/OT/SLP when able to participate in care, consider Cortrak 2/14  Acute hypoxemic respiratory failure in the setting of ?aspiration, stroke - Continue full vent support (4-8cc/kg IBW) - Wean FiO2 for O2 sat > 90% - Daily WUA/SBT - VAP bundle - Pulmonary hygiene - PAD protocol for sedation: Propofol and Fentanyl for goal RASS 0 to -1 - F/u ABG  Possible aspiration event Developing sepsis - Trend WBC, fever curve, LA - F/u Cx data - Continue broad-spectrum antibiotics (Zosyn)  - Follow CXR  HTN ?HLD - Resume home statin as indicated - Holding ASA in the setting of IPH - F/u lipid panel as above  T2DM - SSI - CBGs Q4H - Goal CBG 140-180 - Hold home metformin  GERD - PPI  Anxiety Depression Migraines Back pain s/p spinal cord stimulator placement - Resume home Prozac as able - Spinal stimulator in place, locating remote to undergo MRI  Best Practice: (right click and "Reselect all SmartList Selections" daily)   Diet/type: NPO DVT prophylaxis: SCDs, other AC contraindicated in the setting of IPH GI prophylaxis: PPI Lines: N/A Foley:  N/A Code Status:  full code Last date of multidisciplinary goals of care discussion [Per Primary Team]  Labs:  CBC: Recent Labs   Lab 01/22/24 0832 01/23/24 0428  WBC 6.4  --   NEUTROABS 4.4  --   HGB 14.0 15.3  HCT 42.6 45.0  MCV 91.0  --   PLT 117*  --    Basic Metabolic Panel: Recent Labs  Lab 01/22/24 0832 01/23/24 0428  NA 135 137  K 4.3 4.2  CL 100  --   CO2 27  --   GLUCOSE 156*  --   BUN 15  --   CREATININE 1.12  --   CALCIUM 8.7*  --    GFR: Estimated Creatinine Clearance: 57.9 mL/min (by C-G formula based on SCr of 1.12 mg/dL). Recent Labs  Lab 01/22/24 0832  WBC 6.4   Liver Function Tests: Recent Labs  Lab 01/22/24 0832  AST 17  ALT 19  ALKPHOS 87  BILITOT 0.6  PROT 7.1  ALBUMIN 3.6   No results for input(s): "LIPASE", "AMYLASE" in the last 168 hours. No results for input(s): "AMMONIA" in the last 168 hours.  ABG:    Component Value Date/Time   PHART 7.382 01/23/2024 0428   PCO2ART 31.2 (L) 01/23/2024 0428   PO2ART 60 (L) 01/23/2024 0428   HCO3 18.3 (L) 01/23/2024 0428   TCO2 19 (L) 01/23/2024 0428   ACIDBASEDEF 5.0 (H) 01/23/2024 0428   O2SAT 89 01/23/2024 0428    Coagulation Profile: Recent Labs  Lab  01/22/24 0832  INR 1.0   Cardiac Enzymes: No results for input(s): "CKTOTAL", "CKMB", "CKMBINDEX", "TROPONINI" in the last 168 hours.  HbA1C: No results found for: "HGBA1C"  CBG: Recent Labs  Lab 01/22/24 0807  GLUCAP 120*   Review of Systems:   Patient is encephalopathic and/or intubated; therefore, history has been obtained from chart review.   Past Medical History:  He,  has a past medical history of Anxiety, Arthritis, Cancer (HCC), Depression, Diabetes mellitus without complication (HCC), GERD (gastroesophageal reflux disease), Headache, Hepatitis, History of kidney stones, Hypertension, Mental disorder, Pancreatitis (2012), and Personal history of tobacco use, presenting hazards to health (11/07/2015).   Surgical History:   Past Surgical History:  Procedure Laterality Date   CERVICAL SPINE SURGERY     CIRCUMCISION     COLONOSCOPY     EYE  SURGERY     bilateral cataract removal; implants   LUMBAR LAMINECTOMY/DECOMPRESSION MICRODISCECTOMY  10/07/2012   Procedure: LUMBAR LAMINECTOMY/DECOMPRESSION MICRODISCECTOMY 2 LEVELS;  Surgeon: Tia Alert, MD;  Location: [facility];  Service: Neurosurgery;  Laterality: Left;  Left Lumbar two-three,lumbar four-five hemilaminectomy   PILONIDAL CYST EXCISION     SPINAL CORD STIMULATOR IMPLANT  2018   [employer]   TONSILLECTOMY     TOTAL SHOULDER ARTHROPLASTY Right 10/13/2019   Procedure: TOTAL SHOULDER ARTHROPLASTY;  Surgeon: Lyndle Herrlich, MD;  Location: [facility] ORS;  Service: Orthopedics;  Laterality: Right;   Social History:   reports that he quit smoking about 5 years ago. His smoking use included cigarettes. He started smoking about 35 years ago. He has a 30 pack-year smoking history. He has never used smokeless tobacco. He reports that he does not currently use alcohol. He reports that he does not use drugs.   Family History:  His family history is not on file.   Allergies: No Known Allergies   Home Medications: Prior to Admission medications   Medication Sig Start Date End Date Taking? Authorizing Provider  aspirin 81 MG tablet Take 162 mg by mouth daily.   Yes [provider]  atorvastatin (LIPITOR) 10 MG tablet Take 10 mg by mouth every morning.  02/23/16  Yes [provider]  doxazosin (CARDURA) 4 MG tablet Take 4 mg by mouth at bedtime.   Yes [provider]  FLUoxetine (PROZAC) 20 MG capsule Take 60 mg by mouth every morning.    Yes [provider]  lisinopril (PRINIVIL,ZESTRIL) 40 MG tablet Take 40 mg by mouth every morning.    Yes [provider]  metFORMIN (GLUCOPHAGE) 1000 MG tablet Take 1,000 mg by mouth 2 (two) times daily with a meal.   Yes [provider]  metoprolol succinate (TOPROL-XL) 100 MG 24 hr tablet Take 100 mg by mouth every morning. Take with or immediately following a meal.    Yes [provider]  oxyCODONE (OXY IR/ROXICODONE) 5 MG immediate release tablet Take 5 mg by mouth 5 (five) times daily.   Yes [provider]    Critical care time:   The patient is critically ill with multiple organ system failure and requires high complexity decision making for assessment and support, frequent evaluation and titration of therapies, advanced monitoring, review of radiographic studies and interpretation of complex data.   Critical Care Time devoted to patient care services, exclusive of separately billable procedures, described in this note is 39 minutes.  Tim Lair, PA-C [facility] 01/23/24 4:43 AM  Please see [email or web address] for pager details.  From 7A-7P if no response, please call [phone] After hours, please call ELink [phone]

## 2024-01-23 NOTE — Progress Notes (Signed)
Echocardiogram 2D Echocardiogram has been performed.  Warren Lacy Dakisha Schoof RDCS 01/23/2024, 12:01 PM

## 2024-01-23 NOTE — Progress Notes (Addendum)
STROKE TEAM PROGRESS NOTE    SIGNIFICANT HOSPITAL EVENTS 2/13 patient admitted with right-sided IPH with IVH 2/14 patient intubated due to aspiration and inability to protect airway  INTERIM HISTORY/SUBJECTIVE Patient has been hypotensive requiring pressors to maintain blood pressure within range.  He was intubated overnight after an aspiration event, and there is some concern for sepsis at this point.  He has been febrile.  Head CT performed today appears stable, will likely repeat head CT tomorrow with consideration for EVD if hydrocephalus is shown to be worsening.  MRI cannot be performed as he has an incompatible spinal cord stimulator  OBJECTIVE  CBC    Component Value Date/Time   WBC 19.9 (H) 01/23/2024 0539   RBC 4.88 01/23/2024 0539   HGB 13.9 01/23/2024 0556   HGB 17.2 11/30/2013 0301   HCT 41.0 01/23/2024 0556   HCT 49.6 11/30/2013 0301   PLT 190 01/23/2024 0539   PLT 184 11/30/2013 0301   MCV 88.5 01/23/2024 0539   MCV 92 11/30/2013 0301   MCH 30.7 01/23/2024 0539   MCHC 34.7 01/23/2024 0539   RDW 13.2 01/23/2024 0539   RDW 13.8 11/30/2013 0301   LYMPHSABS 0.7 01/23/2024 0539   LYMPHSABS 2.0 04/17/2012 1253   MONOABS 1.4 (H) 01/23/2024 0539   MONOABS 0.9 04/17/2012 1253   EOSABS 0.0 01/23/2024 0539   EOSABS 0.1 04/17/2012 1253   BASOSABS 0.0 01/23/2024 0539   BASOSABS 0.1 04/17/2012 1253   BASOSABS 1 04/17/2012 1253    BMET    Component Value Date/Time   NA 136 01/23/2024 0556   NA 131 (L) 11/30/2013 0301   K 4.2 01/23/2024 0556   K 3.9 11/30/2013 0301   CL 101 01/23/2024 0539   CL 95 (L) 11/30/2013 0301   CO2 20 (L) 01/23/2024 0539   CO2 29 11/30/2013 0301   GLUCOSE 278 (H) 01/23/2024 0539   GLUCOSE 237 (H) 11/30/2013 0301   BUN 19 01/23/2024 0539   BUN 22 (H) 11/30/2013 0301   CREATININE 1.53 (H) 01/23/2024 0539   CREATININE 0.89 11/30/2013 0301   CALCIUM 8.8 (L) 01/23/2024 0539   CALCIUM 9.6 11/30/2013 0301   GFRNONAA 46 (L) 01/23/2024 0539    GFRNONAA >60 11/30/2013 0301    IMAGING past 24 hours DG CHEST PORT 1 VIEW Result Date: 01/23/2024 CLINICAL DATA:  Acute hypoxic respiratory failure. EXAM: PORTABLE CHEST 1 VIEW COMPARISON:  01/23/2024 at 0018 hours FINDINGS: Endotracheal tube is 2.1 cm above the carina. Nasogastric tube extends into the abdomen. Spinal neurostimulator device is present. Right shoulder arthroplasty. Slightly increased densities at the left lung base may represent atelectasis. Heart size is within normal limits. Negative for a pneumothorax. IMPRESSION: 1. Endotracheal tube is in good position. 2. Slightly increased densities at the left lung base. Findings could represent atelectasis. Electronically Signed   By: Richarda Overlie M.D.   On: 01/23/2024 09:45   DG Abd 1 View Result Date: 01/23/2024 CLINICAL DATA:  Acute hypoxic respiratory failure. Nasogastric tube placement. EXAM: ABDOMEN - 1 VIEW COMPARISON:  CT abdomen pelvis 06/26/2020 FINDINGS: Nasogastric tube in left upper abdomen. Nasogastric tube tip is probably near the gastric fundus or body. There is a spinal neurostimulator device. Postoperative changes in the lower lumbar spine. Gas-filled loops of bowel in the abdomen with a nonobstructive pattern. IMPRESSION: Nasogastric tube tip is probably in the gastric fundus or body. Electronically Signed   By: Richarda Overlie M.D.   On: 01/23/2024 09:44   [place] EKG SITE RITE Result  Date: 01/23/2024 If [facility] image not attached, placement could not be confirmed due to current cardiac rhythm.  DG CHEST PORT 1 VIEW Result Date: 01/23/2024 CLINICAL DATA:  Shortness of breath EXAM: PORTABLE CHEST 1 VIEW COMPARISON:  10/06/2012 FINDINGS: Stable cardiomediastinal silhouette. Aortic atherosclerotic calcification. Reticulonodular opacities in the lower lungs. No focal consolidation, pleural effusion, or pneumothorax. No displaced rib fractures. Spinal stimulator with tip projecting at T7. IMPRESSION: Reticulonodular opacities in the  lower lungs suggestive of airway infection/inflammation. Electronically Signed   By: Minerva Fester M.D.   On: 01/23/2024 03:31   CT HEAD WO CONTRAST ( ) Result Date: 01/23/2024 CLINICAL DATA:  Stroke, hemorrhagic EXAM: CT HEAD WITHOUT CONTRAST TECHNIQUE: Contiguous axial images were obtained from the base of the skull through the vertex without intravenous contrast. RADIATION DOSE REDUCTION: This exam was performed according to the departmental dose-optimization program which includes automated exposure control, adjustment of the mA and/or kV according to patient size and/or use of iterative reconstruction technique. COMPARISON:  CT head February 13, 25. FINDINGS: Brain: Mild interval increase in size of the intraparenchymal hemorrhage in the right thalamus which now measures approximately 2.3 x 2.7 cm. Interval increase in intraventricular hemorrhage layering in the occipital horns. Interval development of mild hydrocephalus with rounding of the temporal horns and outwardly convex third ventricular walls. No evidence of acute large vascular territory infarct. No midline shift. Vascular: No hyperdense vessel. Skull: No acute fracture. Sinuses/Orbits: Clear sinuses.  No acute orbital findings. Other: No mastoid effusions IMPRESSION: 1. Mild interval increase in size of the intraparenchymal hemorrhage in the right thalamus. 2. Interval increase in intraventricular hemorrhage. 3. Interval development of mild hydrocephalus. These results will be called to the ordering clinician or representative by the Radiologist Assistant, and communication documented in the PACS or [employer]. Electronically Signed   By: Feliberto Harts M.D.   On: 01/23/2024 01:38   CT HEAD WO CONTRAST Result Date: 01/22/2024 CLINICAL DATA:  Stroke, follow up EXAM: CT HEAD WITHOUT CONTRAST TECHNIQUE: Contiguous axial images were obtained from the base of the skull through the vertex without intravenous contrast. RADIATION DOSE  REDUCTION: This exam was performed according to the departmental dose-optimization program which includes automated exposure control, adjustment of the mA and/or kV according to patient size and/or use of iterative reconstruction technique. COMPARISON:  CT head January 22, 2024. FINDINGS: Brain: No substantial change in intraparenchymal hemorrhage in the right thalamus. Slight increase in intraventricular extension. No evidence of acute large vascular territory infarct or significant midline shift. No hydrocephalus. Vascular: No hyperdense vessel. Skull: No acute fracture. Sinuses/Orbits: Clear sinuses.  No acute orbital findings. Other: No mastoid effusions. IMPRESSION: No substantial change in intraparenchymal hemorrhage in the right thalamus. Slight increase in intraventricular hemorrhage. Electronically Signed   By: Feliberto Harts M.D.   On: 01/22/2024 20:14    Vitals:   01/23/24 1315 01/23/24 1330 01/23/24 1345 01/23/24 1400  BP: (!) 116/55 (!) 120/57 (!) 118/50 (!) 120/56  Pulse: 63 63 61 61  Resp: 17 18 17 17   Temp:      TempSrc:      SpO2: 98% 98% 98% 97%  Weight:      Height:         PHYSICAL EXAM General: Intubated elderly patient in no acute distress Psych:  Mood and affect appropriate for situation CV: Regular rate and rhythm on monitor Respiratory: Respirations synchronous with ventilator  NEURO (on sedation with low-dose propofol):  Patient rests with eyes closed and does not  respond to voice or follow commands.  Pupils equal round and reactive, sluggish oculocephalic reflex present, will flicker right upper extremity to sternal rub, no movement of left upper extremity to noxious, flickers right upper extremity and bilateral lower extremities to noxious stimuli  ASSESSMENT/PLAN  Mr. AVERILL PONS is a 78 y.o. male with history of diabetes, hypertension, migraines and previous smoking admitted for acute onset left-sided weakness.  He was found to have an IPH at the right  thalamocapsular junction with IVH on CT.  Overnight, he had an aspiration event and became febrile and was intubated due to concern for inability to protect airway as well as aspiration pneumonia.  He is currently hypotensive requiring pressors to maintain MAP within goals.   NIH on Admission 5 ICH score 1  Intraparenchymal Hemorrhage:  right llama capsular junction IPH with IVH Etiology: Likely hypertensive Code Stroke CT head 7 mm acute parenchymal hemorrhage centered at right thalamocapsular junction with mild surrounding edema, moderate volume IVH and no evidence of hydrocephalus CTA head & neck no LVO, severe, greater than 90% stenoses in bilateral internal carotid artery origins, no evidence of aneurysm or AVM in the region of the IPH, severe stenosis within right PCA P2 and P3 segments, severe stenosis within left PCA segment, moderate stenosis within right ICA cavernous segment Repeat head CT 2/14 stable IPH with no increase in hydrocephalus MRI unable to perform due to spinal stimulator 2D Echo EF 70 to 75%, grade 1 diastolic dysfunction, moderately dilated left atrium, interatrial septum not well-visualized LDL 10 HgbA1c 6.6 VTE prophylaxis -SCDs aspirin 81 mg daily prior to admission, now on No antithrombotic secondary to IPH Therapy recommendations:  Pending Disposition: Pending  History of hypertension now hypotensive Home meds: Doxazosin 4 mg daily, lisinopril 40 mg daily, metoprolol 100 mg daily Unstable, requiring Levophed Blood Pressure Goal: SBP between 130-150 for 24 hours and then less than 160   Hyperlipidemia Home meds: Atorvastatin 10 mg daily LDL 10, goal < 70 Resume statin at discharge  Diabetes type II Controlled Home meds: Metformin 1000 mg twice daily HgbA1c 6.6, goal < 7.0 CBGs SSI Recommend close follow-up with PCP for better DM control  Respiratory failure patient pneumonia Patient was intubated 2/14 after aspiration event Ventilator management per  CCM Antibiotics per CCM Extubate when able  Dysphagia Patient has post-stroke dysphagia, SLP consulted    Diet   Diet NPO time specified   Advance diet as tolerated  Other Stroke Risk Factors Obesity, Body mass index is 30.77 kg/m., BMI >/= 30 associated with increased stroke risk, recommend weight loss, diet and exercise as appropriate  Migraines   Other Active Problems None  Hospital day # 1  Patient seen by NP with MD, MD to edit note as needed. Cortney E Ernestina Columbia , MSN, AGACNP-BC [facility] See Amion for schedule and pager information 01/23/2024 2:41 PM   I have personally obtained history,examined this patient, reviewed notes, independently viewed imaging studies, participated in medical decision making and plan of care.ROS completed by me personally and pertinent positives fully documented  I have made any additions or clarifications directly to the above note. Agree with note above.  Patient presented with acute onset left hemiparesis with significantly elevated systolic blood pressure in CT scan showing right basal ganglia parenchymal hemorrhage with intraventricular extension.  Follow-up CT scan showed slight expansion of the hematoma and mild hydrocephalus.  Patient overnight developed respiratory distress after vomiting due to presumed aspiration pneumonia and has been intubated  and remains sedated on ventilatory support respiratory failure.  MRI cannot be obtained due to incompatible spinal cord stimulator.  Plan will check repeat CT today.  Continue strict blood pressure control with systolic goal below 150 for the first 24 hours and then below 160.  Repeat CT head tomorrow morning.  Use cold packs.  And as needed labetalol and hydralazine.  No family available at the bedside for discussion.  Treatment for presumed sepsis and aspiration pneumonia and ventilator support as per critical care team.  Discussed with Dr. Everardo All critical care medicine. This patient  is critically ill and at significant risk of neurological worsening, death and care requires constant monitoring of vital signs, hemodynamics,respiratory and cardiac monitoring, extensive review of multiple databases, frequent neurological assessment, discussion with family, other specialists and medical decision making of high complexity.I have made any additions or clarifications directly to the above note.This critical care time does not reflect procedure time, or teaching time or supervisory time of PA/NP/Med Resident etc but could involve care discussion time.  I spent 30 minutes of neurocritical care time  in the care of  this patient.     Delia Heady, MD Medical Director [facility] Pager: [phone] 01/23/2024 4:18 PM   To contact Stroke Continuity provider, please refer to [email or web address]. After hours, contact General Neurology

## 2024-01-23 NOTE — Progress Notes (Signed)
Initial Nutrition Assessment  DOCUMENTATION CODES:   Not applicable  INTERVENTION:   Initiate tube feeding via NG tube : Osmolite 1.5 at 20 ml/h and increase by 10 ml every 8 hours to goal rate of 55 ml/hr (1320 ml per day)  Prosource TF20 60 ml BID  Provides 2140 kcal, 122 gm protein, 1003 ml free water daily  MVI with minerals daily via tube  100 mg thiamine daily x 7 days  Monitor magnesium and phosphorus every 12 hours x 4 occurrences, MD to replete as needed, as pt is at risk for refeeding syndrome given pt meets criteria for moderate malnutrition.   NUTRITION DIAGNOSIS:   Moderate Malnutrition related to chronic illness as evidenced by moderate fat depletion, moderate muscle depletion.  GOAL:   Patient will meet greater than or equal to 90% of their needs  MONITOR:   TF tolerance  REASON FOR ASSESSMENT:   Consult Enteral/tube feeding initiation and management  ASSESSMENT:   Pt with PMH of DM, HTN, and migraines admitted for nontraumatic ICH.   Pt discussed during ICU rounds and with RN.   2/13 - admit with R IPH with IVH 2/14 - intubated due to aspiration and inability to protect airway   Per significant other pt has been eating less last few months and felt it was due to aging.  Appetite was decreased as well.  Breakfast - toast and jelly Lunch - soup and crackers Dinner - meat, veggie, starch, and usually a dessert Pt uses a cane outside of the home but uses furniture to get around inside home.    Medications reviewed and include: SSI, protonix, senokot-s NS @ 50 ml/hr Precedex Fentanyl  Levophed @ 3 mcg  Labs reviewed:  Mag 1.6  A1C 6.6 CBGs: 179  NG tube in R nare: tip in gastric fundus per xray    NUTRITION - FOCUSED PHYSICAL EXAM:  Flowsheet Row Most Recent Value  Orbital Region Moderate depletion  Upper Arm Region Moderate depletion  Thoracic and Lumbar Region Mild depletion  Buccal Region Unable to assess  Temple Region  Moderate depletion  Clavicle Bone Region Moderate depletion  Clavicle and Acromion Bone Region Moderate depletion  Scapular Bone Region Unable to assess  Dorsal Hand Moderate depletion  Patellar Region Severe depletion  Anterior Thigh Region Severe depletion  Posterior Calf Region Severe depletion  Edema (RD Assessment) None  Hair Reviewed  Eyes Unable to assess  Mouth Unable to assess  Skin Reviewed  Nails Reviewed       Diet Order:   Diet Order             Diet NPO time specified  Diet effective now                   EDUCATION NEEDS:   Not appropriate for education at this time  Skin:  Skin Assessment: Reviewed RN Assessment  Last BM:  unknown  Height:   Ht Readings from Last 1 Encounters:  01/22/24 5\' 7"  (1.702 m)    Weight:   Wt Readings from Last 1 Encounters:  01/22/24 89.1 kg   BMI:  Body mass index is 30.77 kg/m.  Estimated Nutritional Needs:   Kcal:  2000-2200  Protein:  110-125 grams  Fluid:  > 2L /day  Cammy Copa., RD, LDN, CNSC See AMiON for contact information

## 2024-01-23 NOTE — TOC Initial Note (Signed)
Transition of Care [facility]) - Initial/Assessment Note    Patient Details  Name: Herbert Chapman MRN: [id number] Date of Birth: 1946/11/15  Transition of Care [facility]) CM/SW Contact:    Lamonte Sakai, Student-Social Work Phone Number: 01/23/2024, 10:04 AM  Clinical Narrative:                 Pt admitted from home with significant other. Pt currently intubated. Please consult as TOC needs arise.         Patient Goals and CMS Choice            Expected Discharge Plan and Services       Living arrangements for the past 2 months: Mobile Home                                      Prior Living Arrangements/Services Living arrangements for the past 2 months: Mobile Home Lives with:: Significant Other                   Activities of Daily Living      Permission Sought/Granted                  Emotional Assessment   Attitude/Demeanor/Rapport: Intubated (Following Commands or Not Following Commands) Affect (typically observed): Unable to Assess Orientation: :  (Intubated)      Admission diagnosis:  STROKE Patient Active Problem List   Diagnosis Date Noted   Right thalamic stroke (HCC) 01/23/2024   Acute hypoxemic respiratory failure (HCC) 01/23/2024   ICH (intracerebral hemorrhage) (HCC) 01/22/2024   Status post total shoulder arthroplasty, right 10/13/2019   Depression 01/13/2017   Hepatitis C 01/13/2017   Hypertension 01/13/2017   Squamous cell carcinoma 01/13/2017   Type 2 diabetes mellitus (HCC) 01/13/2017   Atherosclerosis 02/23/2016   CKD (chronic kidney disease) stage 3, GFR 30-59 ml/min (HCC) 02/23/2016   Personal history of tobacco use, presenting hazards to health 11/07/2015   Benign non-nodular prostatic hyperplasia with lower urinary tract symptoms 03/16/2015   Chronic back pain 03/16/2015   PCP:  Gracelyn Nurse, MD Pharmacy:   [facility] - [place], [place] - [address] [place] [place] [address] Phone: [phone]  Fax: [phone]     Social Drivers of Health (SDOH) Social History: SDOH Screenings   Food Insecurity: No Food Insecurity (01/22/2024)  Housing: Unknown (01/22/2024)  Transportation Needs: No Transportation Needs (01/22/2024)  Utilities: Not At Risk (01/22/2024)  Social Connections: Socially Integrated (01/22/2024)  Tobacco Use: Medium Risk (01/22/2024)   SDOH Interventions:     Readmission Risk Interventions     No data to display

## 2024-01-23 NOTE — Progress Notes (Signed)
eLink Physician-Brief Progress Note Patient Name: Herbert Chapman DOB: 1946/05/01 MRN: [id number]   Date of Service  01/23/2024  HPI/Events of Note  acute right thalamic IPH secondary to suspected hypertension who suffered a worsening hypoxic respiratory failure in the setting of likely aspiration resulting in urgent bedside intubation this a.m   Reduced urine output.  He had 100 out during dayshift on the additional 50 cc so far examination.  Bladder scan for 217.  eICU Interventions  Standard urinary retention protocol.  No intervention at this time.  Hemodynamically unchanged.        China Deitrick 01/23/2024, 10:54 PM

## 2024-01-23 NOTE — Progress Notes (Signed)
PT Cancellation Note  Patient Details Name: Herbert Chapman MRN: [id number] DOB: 15-Oct-1946   Cancelled Treatment:    Reason Eval/Treat Not Completed: Patient not medically ready; patient with recent intubation and concern for progression of neuro symptoms.  PT will follow up when stable.   Elray Mcgregor 01/23/2024, 9:40 AM Sheran Lawless, PT Acute Rehabilitation Services [phone] 01/23/2024

## 2024-01-23 NOTE — Plan of Care (Signed)
  Problem: Education: Goal: Knowledge of General Education information will improve Description: Including pain rating scale, medication(s)/side effects and non-pharmacologic comfort measures Outcome: Progressing   Problem: Activity: Goal: Risk for activity intolerance will decrease Outcome: Progressing   Problem: Nutrition: Goal: Adequate nutrition will be maintained Outcome: Progressing   Problem: Coping: Goal: Level of anxiety will decrease Outcome: Progressing   Problem: Pain Managment: Goal: General experience of comfort will improve and/or be controlled Outcome: Progressing   Problem: Intracerebral Hemorrhage Tissue Perfusion: Goal: Complications of Intracerebral Hemorrhage will be minimized Outcome: Progressing   Problem: Health Behavior/Discharge Planning: Goal: Ability to manage health-related needs will improve Outcome: Progressing Goal: Goals will be collaboratively established with patient/family Outcome: Progressing   Problem: Nutrition: Goal: Risk of aspiration will decrease Outcome: Progressing Goal: Dietary intake will improve Outcome: Progressing

## 2024-01-23 NOTE — Progress Notes (Signed)
Pt has a Spinal cord stimulator.  It is a [employer].  [id number] with one lead [phone].  The lead can only be 50cm long in order to have a MRI per manufacture guidelines.  We are not able to scan this pt with this device implanted.

## 2024-01-23 NOTE — Progress Notes (Signed)
Brief PCCM progress note  See full consult note early a.m. 2/14 for full details.  In brief patient suffered acute right thalamic IPH secondary to suspected hypertension who suffered a worsening hypoxic respiratory failure in the setting of likely aspiration resulting in urgent bedside intubation this a.m.  Patient is febrile this morning with Tmax 101.0, as needed antipyretics in place.  Herbert Brozowski D. Harris, NP-C [facility] Personal contact information can be found on Amion  If no contact or response made please call [phone] 01/23/2024, 9:18 AM

## 2024-01-23 NOTE — Plan of Care (Signed)
  Problem: Education: Goal: Knowledge of General Education information will improve Description: Including pain rating scale, medication(s)/side effects and non-pharmacologic comfort measures Outcome: Progressing   Problem: Activity: Goal: Risk for activity intolerance will decrease Outcome: Progressing   Problem: Nutrition: Goal: Adequate nutrition will be maintained Outcome: Progressing   Problem: Coping: Goal: Level of anxiety will decrease Outcome: Progressing   Problem: Pain Managment: Goal: General experience of comfort will improve and/or be controlled Outcome: Progressing   Problem: Education: Goal: Knowledge of disease or condition will improve Outcome: Progressing   Problem: Intracerebral Hemorrhage Tissue Perfusion: Goal: Complications of Intracerebral Hemorrhage will be minimized 01/23/2024 1413 by Daniyla Pfahler, Pablo Ledger, RN Outcome: Progressing 01/23/2024 1412 by Shirl Harris, RN Outcome: Progressing   Problem: Health Behavior/Discharge Planning: Goal: Ability to manage health-related needs will improve 01/23/2024 1413 by Desirae Mancusi, Pablo Ledger, RN Outcome: Progressing 01/23/2024 1412 by Shirl Harris, RN Outcome: Progressing Goal: Goals will be collaboratively established with patient/family Outcome: Progressing   Problem: Nutrition: Goal: Risk of aspiration will decrease Outcome: Progressing Goal: Dietary intake will improve Outcome: Progressing

## 2024-01-23 NOTE — Progress Notes (Signed)
Pt was transported to CT scan and back to 4n19 without complications.

## 2024-01-23 NOTE — Progress Notes (Signed)
Patient had worsening of dysarthria, and therefore CT head was repeated showing mild enlargement and very mild hydro, will need short interval repeat scan.  He is awake, alert, able to tell me the month and that he is in the hospital.  He is, however confused, unable to tell me his age.  He has a mild right gaze preference, but is able to cross midline to the left.  He is able to count fingers in all fields.  Left hemiparesis is worse than was documented on admission, but he is still able to move his left arm.  And minimally demonstrated against gravity, but does appear to neglect.  He is able to lift his left leg against gravity, but does not keep it aloft.  I suspect some of his exam worsening is secondary to the morphine that he got earlier (he is on chronic narcotics doing 25 mg of oxycodone per day). if his exam continues to decline, will need to discuss EVD with nsgy but for now will continue to monitor and repeat short interval CT.   Ritta Slot, MD [facility] [phone]  If 7pm- 7am, please page neurology on call as listed in AMION.

## 2024-01-23 NOTE — Procedures (Signed)
Cortrak  Tube Type:  Cortrak - 43 inches Tube Location:  Right nare Initial Placement:  Stomach Secured by: Bridle Technique Used to Measure Tube Placement:  Marking at nare/corner of mouth Cortrak Secured At:  70 cm   Cortrak Tube Team Note:  Consult received to place a Cortrak feeding tube.   No x-ray is required. RN may begin using tube.   If the tube becomes dislodged please keep the tube and contact the Cortrak team at [email or web address] for replacement.  If after hours and replacement cannot be delayed, place a NG tube and confirm placement with an abdominal x-ray.    Betsey Holiday MS, RD, LDN If unable to be reached, please send secure chat to "RD inpatient" available from 8:00a-4:00p daily

## 2024-01-23 NOTE — Progress Notes (Signed)
OT Cancellation Note  Patient Details Name: Herbert Chapman MRN: [id number] DOB: 01/23/1946   Cancelled Treatment:    Reason Eval/Treat Not Completed: Patient not medically ready (sedated/intubated. Will follow up on a later date.)  [facility] 01/23/2024, 6:15 AM Luisa Dago, OT/L   Acute OT Clinical Specialist Acute Rehabilitation Services Pager [phone] Office [phone]

## 2024-01-23 NOTE — Procedures (Signed)
Intubation Procedure Note  JEREMIYAH CULLENS  [id number]  Dec 03, 1946  Date:01/23/24  Time:4:59 AM   Provider Performing:Bayle Calvo Allen Norris    Procedure: Intubation (31500)  Indication(s) Respiratory Failure  Consent Unable to obtain consent due to emergent nature of procedure.   Anesthesia Etomidate, Versed, and Fentanyl   Time Out Verified patient identification, verified procedure, site/side was marked, verified correct patient position, special equipment/implants available, medications/allergies/relevant history reviewed, required imaging and test results available.   Sterile Technique Usual hand hygeine, masks, and gloves were used   Procedure Description Patient positioned in bed supine.  Sedation given as noted above.  Patient was intubated with endotracheal tube using Glidescope.  View was Grade 1 full glottis .  Number of attempts was 1.  Colorimetric CO2 detector was consistent with tracheal placement.   Complications/Tolerance None; patient tolerated the procedure well. Chest X-ray is ordered to verify placement.   EBL Minimal   Specimen(s) None

## 2024-01-23 NOTE — Progress Notes (Signed)
SLP Cancellation Note  Patient Details Name: Herbert Chapman MRN: [id number] DOB: 08-Jun-1946   Cancelled treatment:       Reason Eval/Treat Not Completed: Patient not medically ready Pt intubated. Will f/u for readiness   Joshalyn Ancheta, Riley Nearing 01/23/2024, 8:22 AM

## 2024-01-24 ENCOUNTER — Inpatient Hospital Stay (HOSPITAL_COMMUNITY): Payer: Medicare HMO

## 2024-01-24 DIAGNOSIS — J9601 Acute respiratory failure with hypoxia: Secondary | ICD-10-CM | POA: Diagnosis not present

## 2024-01-24 DIAGNOSIS — I619 Nontraumatic intracerebral hemorrhage, unspecified: Secondary | ICD-10-CM | POA: Diagnosis not present

## 2024-01-24 DIAGNOSIS — I6381 Other cerebral infarction due to occlusion or stenosis of small artery: Secondary | ICD-10-CM | POA: Diagnosis not present

## 2024-01-24 DIAGNOSIS — N179 Acute kidney failure, unspecified: Secondary | ICD-10-CM | POA: Diagnosis not present

## 2024-01-24 LAB — MAGNESIUM
Magnesium: 2.2 mg/dL (ref 1.7–2.4)
Magnesium: 2.1 mg/dL (ref 1.7–2.4)

## 2024-01-24 LAB — BASIC METABOLIC PANEL
Anion gap: 9 (ref 5–15)
BUN: 32 mg/dL — ABNORMAL HIGH (ref 8–23)
CO2: 22 mmol/L (ref 22–32)
Calcium: 8.2 mg/dL — ABNORMAL LOW (ref 8.9–10.3)
Chloride: 104 mmol/L (ref 98–111)
Creatinine, Ser: 1.56 mg/dL — ABNORMAL HIGH (ref 0.61–1.24)
GFR, Estimated: 45 mL/min — ABNORMAL LOW (ref >60)
Glucose, Bld: 222 mg/dL — ABNORMAL HIGH (ref 70–99)
Potassium: 4.1 mmol/L (ref 3.5–5.1)
Sodium: 135 mmol/L (ref 135–145)

## 2024-01-24 LAB — PHOSPHORUS
Phosphorus: 3 mg/dL (ref 2.5–4.6)
Phosphorus: 2.4 mg/dL — ABNORMAL LOW (ref 2.5–4.6)

## 2024-01-24 LAB — CBC
HCT: 36.2 % — ABNORMAL LOW (ref 39.0–52.0)
Hemoglobin: 12.1 g/dL — ABNORMAL LOW (ref 13.0–17.0)
MCH: 30.1 pg (ref 26.0–34.0)
MCHC: 33.4 g/dL (ref 30.0–36.0)
MCV: 90 fL (ref 80.0–100.0)
Platelets: 128 10*3/uL — ABNORMAL LOW (ref 150–400)
RBC: 4.02 MIL/uL — ABNORMAL LOW (ref 4.22–5.81)
RDW: 13.4 % (ref 11.5–15.5)
WBC: 10.5 10*3/uL (ref 4.0–10.5)
nRBC: 0 % (ref 0.0–0.2)

## 2024-01-24 LAB — GLUCOSE, CAPILLARY
Glucose-Capillary: 218 mg/dL — ABNORMAL HIGH (ref 70–99)
Glucose-Capillary: 247 mg/dL — ABNORMAL HIGH (ref 70–99)
Glucose-Capillary: 245 mg/dL — ABNORMAL HIGH (ref 70–99)
Glucose-Capillary: 130 mg/dL — ABNORMAL HIGH (ref 70–99)
Glucose-Capillary: 208 mg/dL — ABNORMAL HIGH (ref 70–99)
Glucose-Capillary: 275 mg/dL — ABNORMAL HIGH (ref 70–99)

## 2024-01-24 MED ORDER — DOXAZOSIN MESYLATE 4 MG PO TABS
4.0000 mg | ORAL_TABLET | Freq: Every day | ORAL | Status: DC
  Administered 2024-01-24: 4 mg via ORAL
  Filled 2024-01-24: qty 1

## 2024-01-24 MED ORDER — DOXAZOSIN MESYLATE 4 MG PO TABS
4.0000 mg | ORAL_TABLET | Freq: Every day | ORAL | Status: DC
  Administered 2024-01-25 – 2024-01-28 (×4): 4 mg
  Filled 2024-01-24 (×5): qty 1

## 2024-01-24 MED ORDER — SODIUM CHLORIDE 0.9 % IV SOLN
3.0000 g | Freq: Four times a day (QID) | INTRAVENOUS | Status: AC
Start: 2024-01-24 — End: 2024-01-28
  Administered 2024-01-24 – 2024-01-27 (×14): 3 g via INTRAVENOUS
  Filled 2024-01-24 (×14): qty 8

## 2024-01-24 MED ORDER — CLEVIDIPINE BUTYRATE 0.5 MG/ML IV EMUL
0.0000 mg/h | INTRAVENOUS | Status: DC
  Administered 2024-01-24: 2 mg/h via INTRAVENOUS
  Administered 2024-01-25: 11 mg/h via INTRAVENOUS
  Administered 2024-01-25: 6 mg/h via INTRAVENOUS
  Administered 2024-01-25: 8 mg/h via INTRAVENOUS
  Administered 2024-01-25: 14 mg/h via INTRAVENOUS
  Administered 2024-01-25: 4 mg/h via INTRAVENOUS
  Administered 2024-01-26: 8 mg/h via INTRAVENOUS
  Administered 2024-01-26: 6 mg/h via INTRAVENOUS
  Administered 2024-01-26: 8 mg/h via INTRAVENOUS
  Filled 2024-01-24 (×9): qty 50

## 2024-01-24 MED ORDER — AMLODIPINE BESYLATE 10 MG PO TABS
10.0000 mg | ORAL_TABLET | Freq: Every day | ORAL | Status: DC
  Administered 2024-01-24: 10 mg via ORAL
  Filled 2024-01-24: qty 1

## 2024-01-24 MED ORDER — AMLODIPINE BESYLATE 5 MG PO TABS
10.0000 mg | ORAL_TABLET | Freq: Every day | ORAL | Status: DC
  Administered 2024-01-25 – 2024-01-28 (×4): 10 mg
  Filled 2024-01-24 (×2): qty 1
  Filled 2024-01-24 (×3): qty 2

## 2024-01-24 MED ORDER — INSULIN ASPART 100 UNIT/ML IJ SOLN
4.0000 [IU] | INTRAMUSCULAR | Status: DC
  Administered 2024-01-24 – 2024-01-25 (×5): 4 [IU] via SUBCUTANEOUS

## 2024-01-24 MED ORDER — CLEVIDIPINE BUTYRATE 0.5 MG/ML IV EMUL
INTRAVENOUS | Status: AC
  Filled 2024-01-24: qty 100

## 2024-01-24 NOTE — Plan of Care (Signed)
  Problem: Clinical Measurements: Goal: Diagnostic test results will improve Outcome: Progressing   Problem: Nutrition: Goal: Adequate nutrition will be maintained Outcome: Progressing   Problem: Coping: Goal: Level of anxiety will decrease Outcome: Progressing   Problem: Skin Integrity: Goal: Risk for impaired skin integrity will decrease Outcome: Progressing   Problem: Clinical Measurements: Goal: Will remain free from infection Outcome: Not Progressing Goal: Respiratory complications will improve Outcome: Not Progressing

## 2024-01-24 NOTE — Procedures (Signed)
Extubation Procedure Note  Patient Details:   Name: Herbert Chapman DOB: 06/04/1946 MRN: [id number]   Airway Documentation:    Vent end date: 01/24/24 Vent end time: 1355   Evaluation  O2 sats: stable throughout Complications: No apparent complications Patient did tolerate procedure well. Bilateral Breath Sounds: Diminished   Yes   Pt extubated per physician order. Pt suctioned via ETT/orally prior, positive cuff leak. Upon extubation, pt placed on 4L nasal cannula. Pt able to speak name, give a good cough and no stridor heard at this time.   Derinda Late 01/24/2024, 1:58 PM

## 2024-01-24 NOTE — Progress Notes (Signed)
Pt. Was transported from 4N19 to CT2 and back on 100% O2 without any complications

## 2024-01-24 NOTE — Progress Notes (Signed)
PT Cancellation Note  Patient Details Name: Herbert Chapman MRN: [id number] DOB: 16-Jun-1946   Cancelled Treatment:    Reason Eval/Treat Not Completed: Medical issues which prohibited therapy. Pt remains intubated and sedated. PT to follow up tomorrow.   Arlyss Gandy 01/24/2024, 12:10 PM

## 2024-01-24 NOTE — Consult Note (Signed)
NAME:  DERALD LORGE, MRN:  [id number], DOB:  May 30, 1946, LOS: 2 ADMISSION DATE:  01/22/2024 CONSULTATION DATE:  01/23/2024 REFERRING MD:  Amada Jupiter - Neuro, CHIEF COMPLAINT:  Respiratory distress s/p R IPH  History of Present Illness:  78 year old man who presented to [facility] 2/13 as a transfer from [facility] for L-sided weakness. Code Stroke called. PMHx significant for HTN, T2DM, pancreatitis, Hepatitis C (s/p treatment with PEG-Intron/ribavirin), GERD, former tobacco use, migraines, anxiety/depression.  Patient initially presented to [facility] as a transfer from [facility] for acute onset of L-sided weakness; Code Stroke was called. LKW 0300 2/13 at which time patient had a fall without head trauma. CT Head demonstrated 2.1 x 2.1 x 3.1cm (7mL) acute APH of R thalamocapsular junction with mild surrounding edema. CTA Head/Neck negative for LVO or vascular malformation but demonstrating intracranial atherosclerotic disease with multifocal stenoses (right PCA P2-3, L PCA P3, R cavernous ICA, paraclinoid L ICA). Cleviprex was started for BP control.  Repeat CT Head 2/13 without significant change in R thalamic IPH, slight increase in IVH. On 2/14AM, patient was noted to have increased dysarthria and confusion with worsening L-sided hemiparesis. Repeat CT Head was completed demonstrating mild enlargement of IPH and mild hydrocephalus. Patient continued to decompensate from a respiratory standpoint with concern for aspiration; he became tachypneic and hypoxic with markedly increased O2 requirements and PCCM was consulted for evaluation. He was subsequently intubated for further airway protection.  PCCM to follow for ventilator management.  Pertinent Medical History:   Past Medical History:  Diagnosis Date   Anxiety    Arthritis    Cancer (HCC)    squamous cell CA removed from skin   Depression    Diabetes mellitus without complication (HCC)    GERD (gastroesophageal reflux disease)    rare-no meds   Headache     h/o migraines   Hepatitis    Hep C-pt had injections and pill and pt states he does not have hep c anymore   History of kidney stones    h/o age 56   Hypertension    Mental disorder    Pancreatitis 2012   Personal history of tobacco use, presenting hazards to health 11/07/2015   Significant Hospital Events: Including procedures, antibiotic start and stop dates in addition to other pertinent events   2/13 - Presented to [facility] as a transfer from [facility] for L-sided weakness, Code Stroke. CT Head with R thalamic IPH. Repeat CT Head later in the day grossly unchanged. 2/14 - Early AM, increased dysarthria/confusion and markedly increased O2 requirements. Concern for aspiration. PCCM called for assessment; patient was subsequently intubated.  Interim History / Subjective:  Off cleviprex Currently on dex. Weaned off fentanyl this am  Objective:  Blood pressure (!) 126/59, pulse 63, temperature 99.7 F (37.6 C), temperature source Axillary, resp. rate 15, height 5\' 7"  (1.702 m), weight 82.6 kg, SpO2 95%.    Vent Mode: PRVC FiO2 (%):  [40 %] 40 % Set Rate:  [15 bmp] 15 bmp Vt Set:  [530 mL] 530 mL PEEP:  [5 cmH20] 5 cmH20 Plateau Pressure:  [15 cmH20-17 cmH20] 17 cmH20   Intake/Output Summary (Last 24 hours) at 01/24/2024 1133 Last data filed at 01/24/2024 0800 Gross per 24 hour  Intake 1313.62 ml  Output 450 ml  Net 863.62 ml   Filed Weights   01/22/24 1321 01/24/24 0500  Weight: 89.1 kg 82.6 kg   Physical Exam: General: Chronically ill-appearing, no acute distress HENT: [place], AT, ETT in place Eyes:  EOMI, no scleral icterus Respiratory: Clear to auscultation bilaterally.  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 CTA Head/Neck negative for LVO or vascular malformation but demonstrating intracranial atherosclerotic disease with multifocal stenoses. 2/15 Repeat CT head with unchanged right thalamic hematoma with intraventricular with subarachnoid extension - Stroke team primary - Goal SBP < 160 after initial 24 hours - Labetalol PRN - MRI Brain unable to be obtained due to incompatible spinal stimulator - Frequent neuro checks - Neuroprotective measures: HOB > 30 degrees, normoglycemia, normothermia, electrolytes WNL - PT/OT/SLP when able to participate in care, Cortrak placed 2/14  Acute hypoxemic respiratory failure in the setting of ?aspiration, stroke -Tolerating SBT -Extubate - Pulmonary hygiene - PAD protocol for sedation: Precedex for goal RASS 0 to -1  Possible aspiration event - Trend WBC, fever curve, LA - F/u Cx data - De-escalate to Unasyn x 5 days total  - end date 2/18 - Follow CXR PRN  AKI Continue mIVF for  Monitor UOP/Cr Minimize nephrotoxic agents  HTN ?HLD - Resume home statin as indicated - Holding ASA in the setting of IPH - F/u lipid panel as above  T2DM - SSI - CBGs Q4H - Goal CBG 140-180 - Hold home metformin  GERD - PPI  Anxiety Depression Migraines Back pain s/p spinal cord stimulator placement - Resume home Prozac as able - Spinal stimulator in place  Best Practice: (right click and "Reselect all SmartList Selections" daily)   Diet/type: NPO DVT prophylaxis: SCDs, other AC contraindicated in the setting of IPH GI prophylaxis: PPI Lines: N/A Foley:  N/A Code Status:  full code Last date of multidisciplinary goals of care discussion [Per Primary Team]  Critical care time:    The patient is critically ill with multiple organ systems  failure and requires high complexity decision making for assessment and support, frequent evaluation and titration of therapies, application of advanced monitoring technologies and extensive interpretation of multiple databases.  Independent Critical Care Time: 36 Minutes.   Mechele Collin, M.D. [facility] Pulmonary/Critical Care Medicine 01/24/2024 11:33 AM   Please see Amion for pager number to reach on-call Pulmonary and Critical Care Team.

## 2024-01-24 NOTE — Progress Notes (Signed)
PT Cancellation Note  Patient Details Name: Herbert Chapman MRN: [id number] DOB: 1946/07/21   Cancelled Treatment:    Reason Eval/Treat Not Completed: Medical issues which prohibited therapy. PT attempted to initiate evaluation however pt hypertensive with SBP in 190s. RN aware. PT will follow up at a later time.   Arlyss Gandy 01/24/2024, 4:20 PM

## 2024-01-24 NOTE — Progress Notes (Addendum)
STROKE TEAM PROGRESS NOTE    SIGNIFICANT HOSPITAL EVENTS 2/13 patient admitted with right-sided IPH with IVH 2/14 patient intubated due to aspiration and inability to protect airway  INTERIM HISTORY/SUBJECTIVE Patient has remained hemodynamically stable off pressors overnight.  His Tmax has been 100.3, will check chest x-ray and urinalysis.  He is doing well on low-dose sedation with fentanyl and Precedex and is able to follow commands and remains with left hemiparesis.. OBJECTIVE  CBC    Component Value Date/Time   WBC 10.5 01/24/2024 0523   RBC 4.02 (L) 01/24/2024 0523   HGB 12.1 (L) 01/24/2024 0523   HGB 17.2 11/30/2013 0301   HCT 36.2 (L) 01/24/2024 0523   HCT 49.6 11/30/2013 0301   PLT 128 (L) 01/24/2024 0523   PLT 184 11/30/2013 0301   MCV 90.0 01/24/2024 0523   MCV 92 11/30/2013 0301   MCH 30.1 01/24/2024 0523   MCHC 33.4 01/24/2024 0523   RDW 13.4 01/24/2024 0523   RDW 13.8 11/30/2013 0301   LYMPHSABS 0.7 01/23/2024 0539   LYMPHSABS 2.0 04/17/2012 1253   MONOABS 1.4 (H) 01/23/2024 0539   MONOABS 0.9 04/17/2012 1253   EOSABS 0.0 01/23/2024 0539   EOSABS 0.1 04/17/2012 1253   BASOSABS 0.0 01/23/2024 0539   BASOSABS 0.1 04/17/2012 1253   BASOSABS 1 04/17/2012 1253    BMET    Component Value Date/Time   NA 135 01/24/2024 0523   NA 131 (L) 11/30/2013 0301   K 4.1 01/24/2024 0523   K 3.9 11/30/2013 0301   CL 104 01/24/2024 0523   CL 95 (L) 11/30/2013 0301   CO2 22 01/24/2024 0523   CO2 29 11/30/2013 0301   GLUCOSE 222 (H) 01/24/2024 0523   GLUCOSE 237 (H) 11/30/2013 0301   BUN 32 (H) 01/24/2024 0523   BUN 22 (H) 11/30/2013 0301   CREATININE 1.56 (H) 01/24/2024 0523   CREATININE 0.89 11/30/2013 0301   CALCIUM 8.2 (L) 01/24/2024 0523   CALCIUM 9.6 11/30/2013 0301   GFRNONAA 45 (L) 01/24/2024 0523   GFRNONAA >60 11/30/2013 0301    IMAGING past 24 hours CT HEAD WO CONTRAST ( ) Result Date: 01/24/2024 CLINICAL DATA:  Stroke follow-up EXAM: CT HEAD  WITHOUT CONTRAST TECHNIQUE: Contiguous axial images were obtained from the base of the skull through the vertex without intravenous contrast. RADIATION DOSE REDUCTION: This exam was performed according to the departmental dose-optimization program which includes automated exposure control, adjustment of the mA and/or kV according to patient size and/or use of iterative reconstruction technique. COMPARISON:  Yesterday FINDINGS: Brain: Right thalamic hematoma with intraventricular to subarachnoid extension is non progressed. On axial images the thalamic hematoma measures 2.3 x 1.5 cm. Mild lateral ventriculomegaly is stable, no ventricular ballooning. Chronic small vessel ischemia in the cerebral white matter. Cerebral volume loss that is generalized. Vascular: No hyperdense vessel or unexpected calcification. Skull: Normal. Negative for fracture or focal lesion. Sinuses/Orbits: No acute finding. IMPRESSION: Unchanged right thalamic hematoma with intraventricular to subarachnoid extension. Stable ventricular volume. Electronically Signed   By: Tiburcio Pea M.D.   On: 01/24/2024 07:56    Vitals:   01/24/24 0900 01/24/24 1000 01/24/24 1100 01/24/24 1200  BP: (!) 126/59 135/61 (!) 148/57   Pulse: 63 63 65   Resp: 15 12 20    Temp:    100.3 F (37.9 C)  TempSrc:    Axillary  SpO2: 95% 96% 96%   Weight:      Height:         PHYSICAL  EXAM General: Intubated elderly patient in no acute distress Psych:  Mood and affect appropriate for situation CV: Regular rate and rhythm on monitor Respiratory: Respirations synchronous with ventilator  NEURO (on sedation with low-dose fentanyl and Precedex):  Patient opens eyes to voice, responds to name and is able to follow simple commands.  Extraocular movements are intact, and he appears to have some left-sided visual neglect.  He moves all 4 extremities to command with full antigravity strength on the right side, is able to move left upper and lower extremities  but cannot lift them off the bed, sensation is intact to light touch throughout.  ASSESSMENT/PLAN  [name] is a 78 y.o. male with history of diabetes, hypertension, migraines and previous smoking admitted for acute onset left-sided weakness.  He was found to have an IPH at the right thalamocapsular junction with IVH on CT.  Overnight, he had an aspiration event and became febrile and was intubated due to concern for inability to protect airway as well as aspiration pneumonia.  He is hemodynamically stable off pressors today. NIH on Admission 5 ICH score 1  Intraparenchymal Hemorrhage:  right Thalamocapsular junction IPH with IVH Etiology: Likely hypertensive Code Stroke CT head 7 mm acute parenchymal hemorrhage centered at right thalamocapsular junction with mild surrounding edema, moderate volume IVH and no evidence of hydrocephalus CTA head & neck no LVO, severe, greater than 90% stenoses in bilateral internal carotid artery origins, no evidence of aneurysm or AVM in the region of the IPH, severe stenosis within right PCA P2 and P3 segments, severe stenosis within left PCA segment, moderate stenosis within right ICA cavernous segment Repeat head CT 2/14 stable IPH with no increase in hydrocephalus MRI unable to perform due to spinal stimulator 2D Echo EF 70 to 75%, grade 1 diastolic dysfunction, moderately dilated left atrium, interatrial septum not well-visualized LDL 10 HgbA1c 6.6 VTE prophylaxis -SCDs aspirin 81 mg daily prior to admission, now on No antithrombotic secondary to IPH Therapy recommendations:  Pending Disposition: Pending  History of hypertension now hypotensive Home meds: Doxazosin 4 mg daily, lisinopril 40 mg daily, metoprolol 100 mg daily Stable off Levophed Blood Pressure Goal: SBP between 130-150 for 24 hours and then less than 160   Hyperlipidemia Home meds: Atorvastatin 10 mg daily LDL 10, goal < 70 Resume statin at discharge  Diabetes type II  Controlled Home meds: Metformin 1000 mg twice daily HgbA1c 6.6, goal < 7.0 CBGs SSI Recommend close follow-up with PCP for better DM control  Respiratory failure patient pneumonia Patient was intubated 2/14 after aspiration event Ventilator management per CCM Antibiotics per CCM Extubate when able  Dysphagia Patient has post-stroke dysphagia, SLP consulted    Diet   Diet NPO time specified   Advance diet as tolerated  Other Stroke Risk Factors Obesity, Body mass index is 28.52 kg/m., BMI >/= 30 associated with increased stroke risk, recommend weight loss, diet and exercise as appropriate  Migraines   Other Active Problems None  Hospital day # 2  Patient seen by NP with MD, MD to edit note as needed. Cortney E Ernestina Columbia , MSN, AGACNP-BC [facility] See Amion for schedule and pager information 01/24/2024 1:11 PM   STROKE MD NOTE :  I have personally obtained history,examined this patient, reviewed notes, independently viewed imaging studies, participated in medical decision making and plan of care.ROS completed by me personally and pertinent positives fully documented  I have made any additions or clarifications directly to the  above note. Agree with note above.  Patient remains intubated for respiratory failure but is doing better today and is awake arousable and follow commands.  Continue strict blood pressure control with systolic goal below 160.  Continue antibiotics for aspiration pneumonia as per critical care team.  Plan to wean off ventilatory support and extubate today.  No family at the bedside. This patient is critically ill and at significant risk of neurological worsening, death and care requires constant monitoring of vital signs, hemodynamics,respiratory and cardiac monitoring, extensive review of multiple databases, frequent neurological assessment, discussion with family, other specialists and medical decision making of high complexity.I have made  any additions or clarifications directly to the above note.This critical care time does not reflect procedure time, or teaching time or supervisory time of PA/NP/Med Resident etc but could involve care discussion time.  I spent 30 minutes of neurocritical care time  in the care of  this patient.      Delia Heady, MD Medical Director [facility] Pager: [phone] 01/24/2024 4:01 PM   To contact Stroke Continuity provider, please refer to [email or web address]. After hours, contact General Neurology

## 2024-01-25 DIAGNOSIS — J9601 Acute respiratory failure with hypoxia: Secondary | ICD-10-CM | POA: Diagnosis not present

## 2024-01-25 DIAGNOSIS — I619 Nontraumatic intracerebral hemorrhage, unspecified: Secondary | ICD-10-CM | POA: Diagnosis not present

## 2024-01-25 DIAGNOSIS — I6381 Other cerebral infarction due to occlusion or stenosis of small artery: Secondary | ICD-10-CM | POA: Diagnosis not present

## 2024-01-25 DIAGNOSIS — N179 Acute kidney failure, unspecified: Secondary | ICD-10-CM | POA: Diagnosis not present

## 2024-01-25 LAB — BASIC METABOLIC PANEL
Anion gap: 10 (ref 5–15)
BUN: 27 mg/dL — ABNORMAL HIGH (ref 8–23)
CO2: 21 mmol/L — ABNORMAL LOW (ref 22–32)
Calcium: 8.5 mg/dL — ABNORMAL LOW (ref 8.9–10.3)
Chloride: 110 mmol/L (ref 98–111)
Creatinine, Ser: 1.31 mg/dL — ABNORMAL HIGH (ref 0.61–1.24)
GFR, Estimated: 56 mL/min — ABNORMAL LOW (ref >60)
Glucose, Bld: 279 mg/dL — ABNORMAL HIGH (ref 70–99)
Potassium: 3.8 mmol/L (ref 3.5–5.1)
Sodium: 141 mmol/L (ref 135–145)

## 2024-01-25 LAB — CBC
HCT: 39 % (ref 39.0–52.0)
Hemoglobin: 12.8 g/dL — ABNORMAL LOW (ref 13.0–17.0)
MCH: 30.3 pg (ref 26.0–34.0)
MCHC: 32.8 g/dL (ref 30.0–36.0)
MCV: 92.2 fL (ref 80.0–100.0)
Platelets: 125 10*3/uL — ABNORMAL LOW (ref 150–400)
RBC: 4.23 MIL/uL (ref 4.22–5.81)
RDW: 13.2 % (ref 11.5–15.5)
WBC: 7.2 10*3/uL (ref 4.0–10.5)
nRBC: 0 % (ref 0.0–0.2)

## 2024-01-25 LAB — GLUCOSE, CAPILLARY
Glucose-Capillary: 258 mg/dL — ABNORMAL HIGH (ref 70–99)
Glucose-Capillary: 313 mg/dL — ABNORMAL HIGH (ref 70–99)
Glucose-Capillary: 350 mg/dL — ABNORMAL HIGH (ref 70–99)
Glucose-Capillary: 374 mg/dL — ABNORMAL HIGH (ref 70–99)
Glucose-Capillary: 378 mg/dL — ABNORMAL HIGH (ref 70–99)
Glucose-Capillary: 286 mg/dL — ABNORMAL HIGH (ref 70–99)
Glucose-Capillary: 268 mg/dL — ABNORMAL HIGH (ref 70–99)

## 2024-01-25 MED ORDER — METOPROLOL TARTRATE 50 MG PO TABS
50.0000 mg | ORAL_TABLET | Freq: Two times a day (BID) | ORAL | Status: DC
  Administered 2024-01-25: 50 mg via ORAL
  Filled 2024-01-25: qty 1

## 2024-01-25 MED ORDER — INSULIN ASPART 100 UNIT/ML IJ SOLN
8.0000 [IU] | Freq: Once | INTRAMUSCULAR | Status: AC
  Administered 2024-01-25: 8 [IU] via SUBCUTANEOUS

## 2024-01-25 MED ORDER — INSULIN ASPART 100 UNIT/ML IJ SOLN
8.0000 [IU] | INTRAMUSCULAR | Status: DC
  Administered 2024-01-25 – 2024-01-26 (×6): 8 [IU] via SUBCUTANEOUS

## 2024-01-25 MED ORDER — ENOXAPARIN SODIUM 40 MG/0.4ML IJ SOSY
40.0000 mg | PREFILLED_SYRINGE | INTRAMUSCULAR | Status: DC
  Administered 2024-01-25 – 2024-01-30 (×6): 40 mg via SUBCUTANEOUS
  Filled 2024-01-25 (×6): qty 0.4

## 2024-01-25 MED ORDER — INSULIN GLARGINE-YFGN 100 UNIT/ML ~~LOC~~ SOLN
25.0000 [IU] | Freq: Every day | SUBCUTANEOUS | Status: DC
  Administered 2024-01-25: 25 [IU] via SUBCUTANEOUS
  Filled 2024-01-25 (×2): qty 0.25

## 2024-01-25 MED ORDER — HYDRALAZINE HCL 50 MG PO TABS
50.0000 mg | ORAL_TABLET | Freq: Once | ORAL | Status: AC
  Administered 2024-01-25: 50 mg
  Filled 2024-01-25: qty 1

## 2024-01-25 MED ORDER — METOPROLOL TARTRATE 50 MG PO TABS
50.0000 mg | ORAL_TABLET | Freq: Two times a day (BID) | ORAL | Status: DC
  Administered 2024-01-26: 50 mg
  Filled 2024-01-25: qty 1

## 2024-01-25 MED ORDER — HYDRALAZINE HCL 50 MG PO TABS
100.0000 mg | ORAL_TABLET | Freq: Three times a day (TID) | ORAL | Status: DC
  Administered 2024-01-25 – 2024-01-29 (×11): 100 mg
  Filled 2024-01-25 (×11): qty 2

## 2024-01-25 MED ORDER — FAMOTIDINE 20 MG PO TABS
20.0000 mg | ORAL_TABLET | Freq: Every day | ORAL | Status: DC
Start: 2024-01-25 — End: 2024-01-26
  Administered 2024-01-25 – 2024-01-26 (×2): 20 mg
  Filled 2024-01-25 (×2): qty 1

## 2024-01-25 MED ORDER — OXYCODONE HCL 5 MG PO TABS
5.0000 mg | ORAL_TABLET | ORAL | Status: DC | PRN
  Administered 2024-01-25 – 2024-01-26 (×3): 5 mg
  Filled 2024-01-25 (×3): qty 1

## 2024-01-25 MED ORDER — HYDRALAZINE HCL 50 MG PO TABS
50.0000 mg | ORAL_TABLET | Freq: Three times a day (TID) | ORAL | Status: DC
  Administered 2024-01-25 (×2): 50 mg
  Filled 2024-01-25 (×2): qty 1

## 2024-01-25 NOTE — Progress Notes (Signed)
eLink Physician-Brief Progress Note Patient Name: Herbert Chapman DOB: Feb 27, 1946 MRN: [id number]   Date of Service  01/25/2024  HPI/Events of Note  Patient is receiving scheduled insulin aspart as well as sliding scale insulin.  Most recent CBG is 378.  eICU Interventions  Will give an extra 8 units of short acting insulin now and 25 units of glargine.  Check CBG every hour x 6.  If levels trend up, we will discontinue subcu insulin and start patient on an insulin drip.  Discussed with bedside nurse.     Intervention Category Intermediate Interventions: Hyperglycemia - evaluation and treatment  Carilyn Goodpasture 01/25/2024, 9:08 PM

## 2024-01-25 NOTE — Evaluation (Signed)
Physical Therapy Evaluation Patient Details Name: Herbert Chapman MRN: [id number] DOB: 1946/04/16 Today's Date: 01/25/2024  History of Present Illness  78 y.o. male presents to [facility] hospital on 01/22/2024 with L weakness. 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 Pt intubated on 2/14. PMH includes HTN, DMII, pancreatitis, hepatitis C, GERD, migraines, anxiety, depression.   Clinical Impression  Pt admitted with above diagnosis. PTA pt lived at home with his significant other, independent. Pt currently with functional limitations due to the deficits listed below (see PT Problem List). On eval, pt required max assist bed mobility, and max assist sit to stand. He demo poor sitting balance, and zero standing balance. L lean in sit progressing to heavy push in stand. Able to attend to L side with min verbal cues. L side weakness and ataxia (UE>LE). SpO2 stable on RA. Pt will benefit from acute skilled PT to increase their independence and safety with mobility to allow discharge.  Upon d/c, pt would benefit from further therapy in inpatient setting, >3 hours/day.          If plan is discharge home, recommend the following:     Can travel by private vehicle        Equipment Recommendations Other (comment) (TBD)  Recommendations for Other Services  Rehab consult    Functional Status Assessment Patient has had a recent decline in their functional status and demonstrates the ability to make significant improvements in function in a reasonable and predictable amount of time.     Precautions / Restrictions Precautions Precautions: Fall;Other (comment) Precaution/Restrictions Comments: L hemi, coretrak, foley, flexiseal      Mobility  Bed Mobility Overal bed mobility: Needs Assistance Bed Mobility: Supine to Sit, Sit to Supine     Supine to sit: Max assist, HOB elevated Sit to supine: Max assist   General bed mobility comments: assist with LLE and trunk, cues for sequencing     Transfers Overall transfer level: Needs assistance   Transfers: Sit to/from Stand Sit to Stand: Max assist           General transfer comment: STS x 3 trials from EOB with therapist anterior to pt, heavy push to L    Ambulation/Gait               General Gait Details: unable to safely progress due to heavy L push  Stairs            Wheelchair Mobility     Tilt Bed    Modified Rankin (Stroke Patients Only) Modified Rankin (Stroke Patients Only) Pre-Morbid Rankin Score: No symptoms Modified Rankin: Severe disability     Balance Overall balance assessment: Needs assistance Sitting-balance support: Single extremity supported, Feet supported Sitting balance-Leahy Scale: Poor Sitting balance - Comments: L lean, able to correct with min verbal/tactile cues Postural control: Left lateral lean   Standing balance-Leahy Scale: Zero Standing balance comment: heavy push to the L in stand, difficult to redirect/correct                             Pertinent Vitals/Pain Pain Assessment Pain Assessment: No/denies pain    Home Living Family/patient expects to be discharged to:: Private residence Living Arrangements: Spouse/significant other (partner, [name]) Available Help at Discharge: Family;Available 24 hours/day Type of Home: Mobile home Home Access: Stairs to enter Entrance Stairs-Rails: Right;Left;Can reach both Entrance Stairs-Number of Steps: 5   Home Layout: One level Home Equipment: Shower seat;Cane -  single point;Cane - quad      Prior Function Prior Level of Function : Independent/Modified Independent                     Extremity/Trunk Assessment   Upper Extremity Assessment Upper Extremity Assessment: LUE deficits/detail LUE Deficits / Details: L side weakness (UE>LE), ataxic    Lower Extremity Assessment Lower Extremity Assessment: LLE deficits/detail LLE Deficits / Details: 3/5, ataxic LLE Sensation: decreased  proprioception    Cervical / Trunk Assessment Cervical / Trunk Assessment: Kyphotic  Communication   Communication Communication: No apparent difficulties    Cognition Arousal: Alert Behavior During Therapy: WFL for tasks assessed/performed   PT - Cognitive impairments: Awareness, Problem solving, Safety/Judgement, Sequencing                         Following commands: Intact       Cueing Cueing Techniques: Verbal cues, Tactile cues     General Comments General comments (skin integrity, edema, etc.): SpO2 stable on RA    Exercises     Assessment/Plan    PT Assessment Patient needs continued PT services  PT Problem List Decreased strength;Decreased balance;Decreased cognition;Decreased knowledge of precautions;Decreased mobility;Decreased activity tolerance;Decreased safety awareness       PT Treatment Interventions DME instruction;Functional mobility training;Balance training;Patient/family education;Therapeutic activities;Gait training;Neuromuscular re-education;Cognitive remediation;Therapeutic exercise    PT Goals (Current goals can be found in the Care Plan section)  Acute Rehab PT Goals Patient Stated Goal: to walk PT Goal Formulation: With patient Time For Goal Achievement: 02/08/24 Potential to Achieve Goals: Good    Frequency Min 1X/week     Co-evaluation               AM-PAC PT "6 Clicks" Mobility  Outcome Measure Help needed turning from your back to your side while in a flat bed without using bedrails?: A Lot Help needed moving from lying on your back to sitting on the side of a flat bed without using bedrails?: A Lot Help needed moving to and from a bed to a chair (including a wheelchair)?: Total Help needed standing up from a chair using your arms (e.g., wheelchair or bedside chair)?: Total Help needed to walk in hospital room?: Total Help needed climbing 3-5 steps with a railing? : Total 6 Click Score: 8    End of Session  Equipment Utilized During Treatment: Gait belt Activity Tolerance: Patient tolerated treatment well Patient left: in bed;with call bell/phone within reach;with nursing/sitter in room Nurse Communication: Mobility status PT Visit Diagnosis: Other abnormalities of gait and mobility (R26.89);Other symptoms and signs involving the nervous system (R29.898)    Time: 1610-9604 PT Time Calculation (min) (ACUTE ONLY): 25 min   Charges:   PT Evaluation $PT Eval Moderate Complexity: 1 Mod PT Treatments $Therapeutic Activity: 8-22 mins PT General Charges $$ ACUTE PT VISIT: 1 Visit         Ferd Glassing., PT  Office # [phone]   Ilda Foil 01/25/2024, 9:46 AM

## 2024-01-25 NOTE — Consult Note (Signed)
NAME:  Herbert Chapman, MRN:  [id number], DOB:  1946/05/08, LOS: 3 ADMISSION DATE:  01/22/2024 CONSULTATION DATE:  01/23/2024 REFERRING MD:  Amada Jupiter - Neuro, CHIEF COMPLAINT:  Respiratory distress s/p R IPH  History of Present Illness:  78 year old man who presented to [facility] 2/13 as a transfer from [facility] for L-sided weakness. Code Stroke called. PMHx significant for HTN, T2DM, pancreatitis, Hepatitis C (s/p treatment with PEG-Intron/ribavirin), GERD, former tobacco use, migraines, anxiety/depression.  Patient initially presented to [facility] as a transfer from [facility] for acute onset of L-sided weakness; Code Stroke was called. LKW 0300 2/13 at which time patient had a fall without head trauma. CT Head demonstrated 2.1 x 2.1 x 3.1cm (7mL) acute APH of R thalamocapsular junction with mild surrounding edema. CTA Head/Neck negative for LVO or vascular malformation but demonstrating intracranial atherosclerotic disease with multifocal stenoses (right PCA P2-3, L PCA P3, R cavernous ICA, paraclinoid L ICA). Cleviprex was started for BP control.  Repeat CT Head 2/13 without significant change in R thalamic IPH, slight increase in IVH. On 2/14AM, patient was noted to have increased dysarthria and confusion with worsening L-sided hemiparesis. Repeat CT Head was completed demonstrating mild enlargement of IPH and mild hydrocephalus. Patient continued to decompensate from a respiratory standpoint with concern for aspiration; he became tachypneic and hypoxic with markedly increased O2 requirements and PCCM was consulted for evaluation. He was subsequently intubated for further airway protection.  PCCM to follow for ventilator management.  Pertinent Medical History:   Past Medical History:  Diagnosis Date   Anxiety    Arthritis    Cancer (HCC)    squamous cell CA removed from skin   Depression    Diabetes mellitus without complication (HCC)    GERD (gastroesophageal reflux disease)    rare-no meds   Headache     h/o migraines   Hepatitis    Hep C-pt had injections and pill and pt states he does not have hep c anymore   History of kidney stones    h/o age 51   Hypertension    Mental disorder    Pancreatitis 2012   Personal history of tobacco use, presenting hazards to health 11/07/2015   Significant Hospital Events: Including procedures, antibiotic start and stop dates in addition to other pertinent events   2/13 - Presented to [facility] as a transfer from [facility] for L-sided weakness, Code Stroke. CT Head with R thalamic IPH. Repeat CT Head later in the day grossly unchanged. 2/14 - Early AM, increased dysarthria/confusion and markedly increased O2 requirements. Concern for aspiration. PCCM called for assessment; patient was subsequently intubated. 2/15  Interim History / Subjective:  Extubated Cleviprex restarted in PM and remains on 12 mg/h this am  Objective:  Blood pressure (!) 158/63, pulse 83, temperature 98.4 F (36.9 C), temperature source Oral, resp. rate 17, height 5\' 7"  (1.702 m), weight 83.1 kg, SpO2 94%.    Vent Mode: PRVC FiO2 (%):  [40 %] 40 % Set Rate:  [15 bmp] 15 bmp Vt Set:  [530 mL] 530 mL PEEP:  [5 cmH20] 5 cmH20   Intake/Output Summary (Last 24 hours) at 01/25/2024 0846 Last data filed at 01/25/2024 0800 Gross per 24 hour  Intake 2121.01 ml  Output 1500 ml  Net 621.01 ml   Filed Weights   01/22/24 1321 01/24/24 0500 01/25/24 0500  Weight: 89.1 kg 82.6 kg 83.1 kg   Physical Exam: General: Chronically ill-appearing, no acute distress HENT: [place], AT, OP clear, MMM Eyes: EOMI,  no scleral icterus Respiratory: Clear to auscultation bilaterally.  No crackles, wheezing or rales Cardiovascular: RRR, -M/R/G, no JVD GI: BS+, soft, nontender Extremities:-Edema,-tenderness Neuro: AAO x3, CNII-XII grossly intact, left hemiparesis with improved strength 3-4/5 Skin: Intact, no rashes or bruising Psych: Normal mood, normal affect GU: External foley in place  Imaging, labs and  test in EMR in the last 24 hours reviewed independently by me. Pertinent findings below: AKI improving. 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 CTA Head/Neck negative for LVO or vascular malformation but demonstrating intracranial atherosclerotic disease with multifocal stenoses. 2/15 Repeat CT head with unchanged right thalamic hematoma with intraventricular with subarachnoid extension - Stroke team primary - Goal SBP < 160 after initial 24 hours - Continue amlodipine. Add hydralazine 50 mg q8h - Labetalol and hydralazine PRN - MRI Brain unable to be obtained due to incompatible spinal stimulator - Frequent neuro checks - Neuroprotective measures: HOB > 30 degrees, normoglycemia, normothermia, electrolytes WNL - PT/OT/SLP when able to participate in care, Cortrak placed 2/14  Acute hypoxemic respiratory failure in the setting of ?aspiration, stroke - Extubated 2/15 - Wean supplemental O2 for goal >88% - Pulmonary hygiene - F/u Cx data - De-escalate to Unasyn x 5 days total  - end date 2/18 - Follow CXR PRN  AKI - improving Continue mIVF for  Monitor UOP/Cr Minimize nephrotoxic agents  HTN HLD - Resume home statin as indicated - Holding ASA in the setting of IPH - F/u lipid panel as above  T2DM - SSI - CBGs Q4H - Increase TF coverage - Goal CBG 140-180 - Hold home metformin  GERD - PPI  Anxiety Depression Migraines Back pain s/p spinal cord stimulator placement - Resume home Prozac as able - Spinal stimulator in place  Best Practice: (right click and "Reselect all SmartList Selections" daily)   Diet/type: tubefeeds DVT prophylaxis: LMWH CT head stable GI prophylaxis: H2B  Lines: N/A Foley:  N/A Code Status:  full  code Last date of multidisciplinary goals of care discussion [Per Primary Team]  Critical care time:    The patient is critically ill with HTN emergency and requires high complexity decision making for assessment and support, frequent evaluation and titration of therapies, application of advanced monitoring technologies and extensive interpretation of multiple databases.  Independent Critical Care Time: 35 Minutes.   Mechele Collin, M.D. [facility] Pulmonary/Critical Care Medicine 01/25/2024 8:46 AM   Please see Amion for pager number to reach on-call Pulmonary and Critical Care Team.

## 2024-01-25 NOTE — Progress Notes (Signed)

## 2024-01-25 NOTE — Progress Notes (Addendum)
STROKE TEAM PROGRESS NOTE    SIGNIFICANT HOSPITAL EVENTS 2/13 patient admitted with right-sided IPH with IVH 2/14 patient intubated due to aspiration and inability to protect airway 2/15 patient extubated  INTERIM HISTORY/SUBJECTIVE Patient has continued to require Cleviprex to maintain blood pressure within parameters.  P.o. blood pressure medicines increased in attempt to wean this.  Tmax has been 100, and chest x-ray was noted to have improved left lung base opacity.  Urinalysis still pending. OBJECTIVE  CBC    Component Value Date/Time   WBC 7.2 01/25/2024 0417   RBC 4.23 01/25/2024 0417   HGB 12.8 (L) 01/25/2024 0417   HGB 17.2 11/30/2013 0301   HCT 39.0 01/25/2024 0417   HCT 49.6 11/30/2013 0301   PLT 125 (L) 01/25/2024 0417   PLT 184 11/30/2013 0301   MCV 92.2 01/25/2024 0417   MCV 92 11/30/2013 0301   MCH 30.3 01/25/2024 0417   MCHC 32.8 01/25/2024 0417   RDW 13.2 01/25/2024 0417   RDW 13.8 11/30/2013 0301   LYMPHSABS 0.7 01/23/2024 0539   LYMPHSABS 2.0 04/17/2012 1253   MONOABS 1.4 (H) 01/23/2024 0539   MONOABS 0.9 04/17/2012 1253   EOSABS 0.0 01/23/2024 0539   EOSABS 0.1 04/17/2012 1253   BASOSABS 0.0 01/23/2024 0539   BASOSABS 0.1 04/17/2012 1253   BASOSABS 1 04/17/2012 1253    BMET    Component Value Date/Time   NA 141 01/25/2024 0417   NA 131 (L) 11/30/2013 0301   K 3.8 01/25/2024 0417   K 3.9 11/30/2013 0301   CL 110 01/25/2024 0417   CL 95 (L) 11/30/2013 0301   CO2 21 (L) 01/25/2024 0417   CO2 29 11/30/2013 0301   GLUCOSE 279 (H) 01/25/2024 0417   GLUCOSE 237 (H) 11/30/2013 0301   BUN 27 (H) 01/25/2024 0417   BUN 22 (H) 11/30/2013 0301   CREATININE 1.31 (H) 01/25/2024 0417   CREATININE 0.89 11/30/2013 0301   CALCIUM 8.5 (L) 01/25/2024 0417   CALCIUM 9.6 11/30/2013 0301   GFRNONAA 56 (L) 01/25/2024 0417   GFRNONAA >60 11/30/2013 0301    IMAGING past 24 hours DG CHEST PORT 1 VIEW Result Date: 01/24/2024 CLINICAL DATA:  Fever. EXAM:  PORTABLE CHEST 1 VIEW COMPARISON:  Radiograph yesterday FINDINGS: Interval extubation. Weighted enteric tube tip below the diaphragm, not included in the field of view. Persistent left lung base opacity with mild improvement. Minimal right infrahilar opacity. Stable cardiomegaly, unchanged mediastinal contours. No pleural effusion or pneumothorax. Spinal stimulator in place. IMPRESSION: 1. Interval extubation. 2. Persistent left lung base opacity with mild improvement. Minimal right infrahilar opacity. 3. Stable cardiomegaly. Electronically Signed   By: Narda Rutherford M.D.   On: 01/24/2024 18:27    Vitals:   01/25/24 1015 01/25/24 1030 01/25/24 1045 01/25/24 1122  BP: (!) 123/98 (!) 158/64 (!) 158/62   Pulse: 100 100 (!) 103   Resp: 16 17 20    Temp:    98.6 F (37 C)  TempSrc:    Oral  SpO2: 95% 95% 94%   Weight:      Height:         PHYSICAL EXAM General: Well-nourished, well-developed elderly patient in no acute distress Psych:  Mood and affect appropriate for situation CV: Regular rate and rhythm on monitor Respiratory: Respirations regular and unlabored on supplemental O2   NEURO:  Mental Status: AA&Ox3  Speech/Language: speech is without dysarthria or aphasia.    Cranial Nerves:  II: PERRL.  III, IV, VI: EOMI. Eyelids elevate  symmetrically.  V: Sensation is intact to light touch and symmetrical to face.  VII: Subtle left facial droop VIII: hearing intact to voice. IX, X: Phonation is normal.  XII: tongue is midline without fasciculations. Motor: Able to move all 4 extremities with good antigravity strength, right stronger than left Tone: is normal and bulk is normal Sensation- Intact to light touch bilaterally.  Some left-sided neglect Coordination: FTN intact bilaterally Gait- deferred    ASSESSMENT/PLAN  Mr. ARTHER HEISLER is a 78 y.o. male with history of diabetes, hypertension, migraines and previous smoking admitted for acute onset left-sided weakness.  He was  found to have an IPH at the right thalamocapsular junction with IVH on CT.  Overnight, he had an aspiration event and became febrile and was intubated due to concern for inability to protect airway as well as aspiration pneumonia.   NIH on Admission 5 ICH score 1  ICH:  right Thalamocapsular junction IPH with IVH, etiology: Likely hypertensive Code Stroke CT head 7 mm acute parenchymal hemorrhage centered at right thalamocapsular junction with mild surrounding edema, moderate volume IVH and no evidence of hydrocephalus CTA head & neck no LVO, severe, greater than 90% stenoses in bilateral internal carotid artery origins, no evidence of aneurysm or AVM in the region of the IPH, severe stenosis within right PCA P2 and P3 segments, severe stenosis within left PCA segment, moderate stenosis within right ICA cavernous segment Repeat head CT 2/14 stable IPH with no increase in hydrocephalus MRI unable to perform due to spinal stimulator 2D Echo EF 70 to 75% LDL 10 HgbA1c 6.6 VTE prophylaxis -Lovenox aspirin 81 mg daily prior to admission, now on No antithrombotic secondary to IPH Therapy recommendations:  CIR Disposition: Pending  Hypertension Home meds: Doxazosin 4 mg daily, lisinopril 40 mg daily, metoprolol 100 mg daily Continues to require Cleviprex, wean off as able Now on amlodipine 10, doxazosin 4, hydralazine 100 every 8h Add metoprolol 50 bid Blood Pressure Goal: SBP less than 160  Long-term BP goal normotensive  Hyperlipidemia Home meds: Atorvastatin 10 mg daily LDL 10, goal < 70 Resume statin at discharge  Diabetes type II Controlled Home meds: Metformin 1000 mg twice daily HgbA1c 6.6, goal < 7.0 CBGs SSI Recommend close follow-up with PCP for better DM control  Respiratory failure  Aspiration pneumonia Patient was intubated 2/14 after aspiration event Extubated 2/15 Tolerating well so far Antibiotics per CCM On Unasyn Tmax 100.3-> afebrile Leukocytosis WBC  6.4--19.9--10.5--7.2  Dysphagia Patient has post-stroke dysphagia SLP consulted On tube feeding at 55 cc/h Pending further speech evaluation  Other Stroke Risk Factors Advanced age Migraines   Other Active Problems AKI, creatinine 1.12--1.53--1.56--1.31 Tachycardia, HR 110s, put on metoprolol  Hospital day # 3  Patient seen by NP with MD, MD to edit note as needed. Cortney E Ernestina Columbia , MSN, AGACNP-BC [facility] See Amion for schedule and pager information 01/25/2024 12:28 PM  ATTENDING NOTE: I reviewed above note and agree with the assessment and plan. Pt was seen and examined.   Pt was extubated yesterday afternoon, he is now awake, alert, eyes open, orientated to place, time but stated age 14 instead of 43. No aphasia but paucity of language, following all simple commands. Able to name and repeat. No gaze palsy, tracking bilaterally, visual field full, PERRL. Mild left facial droop and tongue protrusion to the left. RUE and RLE 5/5, LUE 3-/5 with significant ataxia seems out of proportion to the weakness. LLE 3-/5 proximal and knee  flexion but distal ankle DF 4/5. Sensation symmetrical bilaterally but seems to have left simultagnosia, R FTN intact, gait not tested.  RN and Dr. Everardo All are at the bedside.  Patient extubated yesterday, tolerating well so far, neuro stable, similar as on admission.  However, BP still on the higher end, needing Cleviprex, will add hydralazine and metoprolol in order to wean off Cleviprex.  Also tachycardia, on metoprolol.  Leukocytosis and creatinine improved, continue Unasyn and tube feeding.  Need further swallow evaluation.  For detailed assessment and plan, please refer to above/below as I have made changes wherever appropriate.   Marvel Plan, MD PhD Stroke Neurology 01/25/2024 5:17 PM  This patient is critically ill due to ICH, respiratory failure, AKI, dysphagia and at significant risk of neurological worsening, death form  hematoma expansion, cerebral edema, renal failure, aspiration and sepsis. This patient's care requires constant monitoring of vital signs, hemodynamics, respiratory and cardiac monitoring, review of multiple databases, neurological assessment, discussion with family, other specialists and medical decision making of high complexity. I spent 40 minutes of neurocritical care time in the care of this patient.    To contact Stroke Continuity provider, please refer to [email or web address]. After hours, contact General Neurology

## 2024-01-26 DIAGNOSIS — E11649 Type 2 diabetes mellitus with hypoglycemia without coma: Secondary | ICD-10-CM | POA: Diagnosis not present

## 2024-01-26 DIAGNOSIS — I10 Essential (primary) hypertension: Secondary | ICD-10-CM

## 2024-01-26 DIAGNOSIS — I619 Nontraumatic intracerebral hemorrhage, unspecified: Secondary | ICD-10-CM | POA: Diagnosis not present

## 2024-01-26 DIAGNOSIS — I6381 Other cerebral infarction due to occlusion or stenosis of small artery: Secondary | ICD-10-CM | POA: Diagnosis not present

## 2024-01-26 DIAGNOSIS — N179 Acute kidney failure, unspecified: Secondary | ICD-10-CM | POA: Diagnosis not present

## 2024-01-26 DIAGNOSIS — Z794 Long term (current) use of insulin: Secondary | ICD-10-CM | POA: Diagnosis not present

## 2024-01-26 DIAGNOSIS — J9601 Acute respiratory failure with hypoxia: Secondary | ICD-10-CM | POA: Diagnosis not present

## 2024-01-26 LAB — GLUCOSE, CAPILLARY
Glucose-Capillary: 172 mg/dL — ABNORMAL HIGH (ref 70–99)
Glucose-Capillary: 185 mg/dL — ABNORMAL HIGH (ref 70–99)
Glucose-Capillary: 219 mg/dL — ABNORMAL HIGH (ref 70–99)
Glucose-Capillary: 233 mg/dL — ABNORMAL HIGH (ref 70–99)
Glucose-Capillary: 250 mg/dL — ABNORMAL HIGH (ref 70–99)
Glucose-Capillary: 258 mg/dL — ABNORMAL HIGH (ref 70–99)
Glucose-Capillary: 269 mg/dL — ABNORMAL HIGH (ref 70–99)
Glucose-Capillary: 273 mg/dL — ABNORMAL HIGH (ref 70–99)

## 2024-01-26 LAB — CBC
HCT: 38.9 % — ABNORMAL LOW (ref 39.0–52.0)
Hemoglobin: 12.8 g/dL — ABNORMAL LOW (ref 13.0–17.0)
MCH: 30.2 pg (ref 26.0–34.0)
MCHC: 32.9 g/dL (ref 30.0–36.0)
MCV: 91.7 fL (ref 80.0–100.0)
Platelets: 155 10*3/uL (ref 150–400)
RBC: 4.24 MIL/uL (ref 4.22–5.81)
RDW: 13.3 % (ref 11.5–15.5)
WBC: 7.2 10*3/uL (ref 4.0–10.5)
nRBC: 0 % (ref 0.0–0.2)

## 2024-01-26 LAB — BASIC METABOLIC PANEL
Anion gap: 11 (ref 5–15)
BUN: 31 mg/dL — ABNORMAL HIGH (ref 8–23)
CO2: 24 mmol/L (ref 22–32)
Calcium: 9.4 mg/dL (ref 8.9–10.3)
Chloride: 108 mmol/L (ref 98–111)
Creatinine, Ser: 1.02 mg/dL (ref 0.61–1.24)
GFR, Estimated: >60 mL/min (ref >60)
Glucose, Bld: 241 mg/dL — ABNORMAL HIGH (ref 70–99)
Potassium: 4.1 mmol/L (ref 3.5–5.1)
Sodium: 143 mmol/L (ref 135–145)

## 2024-01-26 LAB — CULTURE, RESPIRATORY W GRAM STAIN

## 2024-01-26 MED ORDER — LABETALOL HCL 5 MG/ML IV SOLN
20.0000 mg | Freq: Once | INTRAVENOUS | Status: AC
  Administered 2024-01-26: 20 mg via INTRAVENOUS
  Filled 2024-01-26: qty 4

## 2024-01-26 MED ORDER — LOSARTAN POTASSIUM 50 MG PO TABS
100.0000 mg | ORAL_TABLET | Freq: Every day | ORAL | Status: DC
  Administered 2024-01-26 – 2024-01-28 (×3): 100 mg
  Filled 2024-01-26 (×5): qty 2

## 2024-01-26 MED ORDER — INSULIN GLARGINE-YFGN 100 UNIT/ML ~~LOC~~ SOLN
20.0000 [IU] | Freq: Two times a day (BID) | SUBCUTANEOUS | Status: DC
  Administered 2024-01-26: 20 [IU] via SUBCUTANEOUS
  Filled 2024-01-26 (×4): qty 0.2

## 2024-01-26 MED ORDER — METOPROLOL TARTRATE 50 MG PO TABS: 50.0000 mg | ORAL_TABLET | Freq: Once | ORAL | Status: DC

## 2024-01-26 MED ORDER — INSULIN ASPART 100 UNIT/ML IJ SOLN
12.0000 [IU] | INTRAMUSCULAR | Status: DC
  Administered 2024-01-26 – 2024-01-29 (×18): 12 [IU] via SUBCUTANEOUS

## 2024-01-26 MED ORDER — ATORVASTATIN CALCIUM 10 MG PO TABS
10.0000 mg | ORAL_TABLET | Freq: Every day | ORAL | Status: DC
  Administered 2024-01-26 – 2024-01-27 (×2): 10 mg
  Filled 2024-01-26 (×2): qty 1

## 2024-01-26 MED ORDER — METOPROLOL TARTRATE 50 MG PO TABS
50.0000 mg | ORAL_TABLET | Freq: Once | ORAL | Status: AC
  Administered 2024-01-26: 50 mg
  Filled 2024-01-26: qty 1

## 2024-01-26 MED ORDER — FAMOTIDINE 20 MG PO TABS
20.0000 mg | ORAL_TABLET | Freq: Two times a day (BID) | ORAL | Status: DC
  Administered 2024-01-26 – 2024-01-28 (×5): 20 mg
  Filled 2024-01-26 (×6): qty 1

## 2024-01-26 MED ORDER — METOPROLOL TARTRATE 50 MG PO TABS
100.0000 mg | ORAL_TABLET | Freq: Two times a day (BID) | ORAL | Status: DC
  Administered 2024-01-26 – 2024-01-28 (×5): 100 mg
  Filled 2024-01-26 (×5): qty 2
  Filled 2024-01-26: qty 4

## 2024-01-26 MED ORDER — INSULIN GLARGINE-YFGN 100 UNIT/ML ~~LOC~~ SOLN
25.0000 [IU] | Freq: Two times a day (BID) | SUBCUTANEOUS | Status: DC
  Administered 2024-01-26: 25 [IU] via SUBCUTANEOUS
  Filled 2024-01-26 (×2): qty 0.25

## 2024-01-26 MED ORDER — HYDRALAZINE HCL 20 MG/ML IJ SOLN
5.0000 mg | INTRAMUSCULAR | Status: DC | PRN
  Administered 2024-01-26: 20 mg via INTRAVENOUS
  Filled 2024-01-26: qty 1

## 2024-01-26 NOTE — Inpatient Diabetes Management (Signed)
Inpatient Diabetes Program Recommendations  AACE/ADA: New Consensus Statement on Inpatient Glycemic Control (2015)  Target Ranges:  Prepandial:   less than 140 mg/dL      Peak postprandial:   less than 180 mg/dL (1-2 hours)      Critically ill patients:  140 - 180 mg/dL    Latest Reference Range & Units 01/24/24 23:09 01/25/24 03:15 01/25/24 07:59 01/25/24 11:23 01/25/24 16:08 01/25/24 19:13 01/25/24 22:03  Glucose-Capillary 70 - 99 mg/dL 409 (H)  12 units Novolog  258 (H)  12 units Novolog  313 (H)  15 units Novolog  350 (H)  19 units Novolog  374 (H)  23 units Novolog  378 (H)  23 units Novolog  286 (H)  8 units Novolog  25 units Semglee  (H): Data is abnormally high  Latest Reference Range & Units 01/26/24 00:23 01/26/24 02:48 01/26/24 03:13 01/26/24 07:31 01/26/24 11:26  Glucose-Capillary 70 - 99 mg/dL 811 (H)  16 units Novolog  172 (H) 185 (H)  11 units Novolog  269 (H)  16 units Novolog  273 (H)  20 units Novolog   (H): Data is abnormally high    Home DM meds: Metformin 1000 mg BID   Current Orders: Semglee 25 units BID     Novolog Moderate Correction Scale/ SSI (0-15 units) Q4 hours     Novolog 12 units Q4 hours    Started Semglee 25 units QPM last night Increased Novolog TF coverage to 8 units Q4H yest at 12pm (was 4 units Q4H) TF running 55cc/hr   Semglee increased to 25 units BID today (0.6 units/kg) Novolog Tube Feed Coverage increased to 12 units Q4 hours today May consider backing down on the Semglee to 20 units BID (0.5 units/kg) Will follow daily and make recommendations as needed    --Will follow patient during hospitalization--  Ambrose Finland RN, MSN, CDCES Diabetes Coordinator Inpatient Glycemic Control Team Team Pager: [phone] (8a-5p)

## 2024-01-26 NOTE — TOC Progression Note (Signed)
Transition of Care [facility]) - Progression Note    Patient Details  Name: MAVRICK MCQUIGG MRN: [id number] Date of Birth: Apr 23, 1946  Transition of Care [facility]) CM/SW Contact  Mearl Latin, LCSW Phone Number: 01/26/2024, 9:07 AM  Clinical Narrative:    Patient extubated. TOC following for rehab needs and CIR evaluation of candidacy.    Expected Discharge Plan: IP Rehab Facility Barriers to Discharge: Continued Medical Work up, Conservator, museum/gallery and Services In-house Referral: Clinical Social Work     Living arrangements for the past 2 months: Mobile Home                                       Social Determinants of Health (SDOH) Interventions SDOH Screenings   Food Insecurity: No Food Insecurity (01/22/2024)  Housing: Unknown (01/22/2024)  Transportation Needs: No Transportation Needs (01/22/2024)  Utilities: Not At Risk (01/22/2024)  Social Connections: Socially Integrated (01/22/2024)  Tobacco Use: Medium Risk (01/22/2024)    Readmission Risk Interventions     No data to display

## 2024-01-26 NOTE — Evaluation (Signed)
Occupational Therapy Evaluation Patient Details Name: Herbert Chapman MRN: [id number] DOB: 09/08/1946 Today's Date: 01/26/2024   History of Present Illness   78 y.o. male presents to [facility] hospital on 01/22/2024 with L weakness. 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 Pt intubated on 2/14-2/15. PMH includes HTN, DMII, pancreatitis, hepatitis C, GERD, migraines, anxiety, depression.     Clinical Impressions PTA, pt lived with partner [name] and was independent. Upon eval, pt with decreased coordination/strength of LUE, midline orientation in sitting and standing, expressive difficulties, decreased balance. Pt needing mod-total A +2 for BADL at this time. Pt able to transfer to chair with stedy today with mod A +2. Will continue to follow acutely. Due to significant change in functional status and good support at home, recommending intensive multidisciplinary rehabilitation >3 hours/day to optimize safety and independence in ADL.     If plan is discharge home, recommend the following:   Two people to help with walking and/or transfers;Two people to help with bathing/dressing/bathroom;Assistance with cooking/housework;Direct supervision/assist for medications management;Direct supervision/assist for financial management;Assist for transportation;Help with stairs or ramp for entrance;Assistance with feeding     Functional Status Assessment   Patient has had a recent decline in their functional status and demonstrates the ability to make significant improvements in function in a reasonable and predictable amount of time.     Equipment Recommendations   Other (comment) (defer)     Recommendations for Other Services   Rehab consult     Precautions/Restrictions   Precautions Precautions: Fall;Other (comment) Precaution/Restrictions Comments: L hemi, coretrak, foley, flexiseal Restrictions Weight Bearing Restrictions Per Provider Order: No     Mobility Bed Mobility Overal bed mobility:  Needs Assistance Bed Mobility: Supine to Sit     Supine to sit: Mod assist     General bed mobility comments: assist for trunk, scooting hips out, and cues for sequencing and then balance at EOB    Transfers Overall transfer level: Needs assistance Equipment used: Ambulation equipment used Transfers: Sit to/from Stand, Bed to chair/wheelchair/BSC Sit to Stand: Mod assist, +2 physical assistance, +2 safety/equipment, From elevated surface, Via lift equipment           General transfer comment: mod A +2 for initial rise; pt with heavy L lateral lean; receptive to cues to correct but mod A to maintain midline and continued cues. Transfer via Lift Equipment: Stedy    Balance Overall balance assessment: Needs assistance Sitting-balance support: Single extremity supported, Feet supported Sitting balance-Leahy Scale: Poor Sitting balance - Comments: L lean, able to correct with min verbal/tactile cues Postural control: Left lateral lean Standing balance support: Bilateral upper extremity supported, During functional activity Standing balance-Leahy Scale: Zero Standing balance comment: requires stedy and external assist                           ADL either performed or assessed with clinical judgement   ADL Overall ADL's : Needs assistance/impaired Eating/Feeding: NPO   Grooming: Maximal assistance;Sitting Grooming Details (indicate cue type and reason): L lateral lean sitting EOB, difficulty with coordination of LUE to serve as helper hand for RUE Upper Body Bathing: Maximal assistance;Sitting   Lower Body Bathing: Total assistance;Sit to/from stand;Sitting/lateral leans;+2 for physical assistance;+2 for safety/equipment   Upper Body Dressing : Maximal assistance;Sitting   Lower Body Dressing: Total assistance;Sit to/from stand;+2 for safety/equipment;+2 for physical assistance   Toilet Transfer: +2 for physical assistance;+2 for safety/equipment;Moderate  assistance  Vision Patient Visual Report: No change from baseline Additional Comments: no apparent visual deficits not formally assessed     Perception         Praxis Praxis: Impaired Praxis Impairment Details: Limb apraxia Praxis-Other Comments: diffilcuty with motor control   Pertinent Vitals/Pain Pain Assessment Pain Assessment: No/denies pain     Extremity/Trunk Assessment Upper Extremity Assessment Upper Extremity Assessment: LUE deficits/detail LUE Deficits / Details: L side weakness (UE>LE), moves against gravity and when focusing on one task able to sustain arm against gravity for short period. pt ataxic. able to make thumbs up, ok sign. Pt with trigger finger in 2nd and 3rd digits he reports is baseline. LUE Coordination: decreased gross motor;decreased fine motor   Lower Extremity Assessment Lower Extremity Assessment: Defer to PT evaluation   Cervical / Trunk Assessment Cervical / Trunk Assessment: Kyphotic   Communication Communication Communication: Impaired Factors Affecting Communication:  (mild difficulty verbally expressing self)   Cognition Arousal: Alert Behavior During Therapy: WFL for tasks assessed/performed Cognition: Cognition impaired   Orientation impairments:  (intermittent difficulty recalling sequence of events but is aware he had a CVA) Awareness: Intellectual awareness intact, Online awareness impaired (difficulty problem solving through his deficits that he is aware of) Memory impairment (select all impairments): Short-term memory Attention impairment (select first level of impairment): Sustained attention Executive functioning impairment (select all impairments): Organization, Sequencing, Problem solving OT - Cognition Comments: Pt follows commands and is conversational. Decresaed insight into problem solving in spite of deficits. Pleasant and conversational overall                 Following commands: Intact  (one step commands.)       Cueing  General Comments   Cueing Techniques: Verbal cues;Tactile cues      Exercises     Shoulder Instructions      Home Living Family/patient expects to be discharged to:: Private residence Living Arrangements: Spouse/significant other (partner, [name]) Available Help at Discharge: Family;Available 24 hours/day Type of Home: Mobile home Home Access: Stairs to enter Entrance Stairs-Number of Steps: 5 Entrance Stairs-Rails: Right;Left;Can reach both Home Layout: One level     Bathroom Shower/Tub: Chief Strategy Officer: Standard     Home Equipment: Shower seat;Cane - single point;Cane - quad          Prior Functioning/Environment Prior Level of Function : Independent/Modified Independent                    OT Problem List: Decreased strength;Decreased activity tolerance;Impaired balance (sitting and/or standing);Decreased coordination;Decreased cognition;Decreased safety awareness;Decreased knowledge of use of DME or AE;Impaired UE functional use   OT Treatment/Interventions: Therapeutic exercise;Self-care/ADL training;DME and/or AE instruction;Neuromuscular education;Therapeutic activities;Balance training;Cognitive remediation/compensation;Patient/family education      OT Goals(Current goals can be found in the care plan section)   Acute Rehab OT Goals Patient Stated Goal: get better OT Goal Formulation: With patient Time For Goal Achievement: 02/09/24 Potential to Achieve Goals: Good   OT Frequency:  Min 1X/week    Co-evaluation              AM-PAC OT "6 Clicks" Daily Activity     Outcome Measure Help from another person eating meals?: Total (NPO) Help from another person taking care of personal grooming?: A Lot Help from another person toileting, which includes using toliet, bedpan, or urinal?: Total (fecal management and purewick) Help from another person bathing (including washing, rinsing, drying)?:  A Lot Help from another person to put  on and taking off regular upper body clothing?: A Lot Help from another person to put on and taking off regular lower body clothing?: A Lot 6 Click Score: 10   End of Session Equipment Utilized During Treatment: Gait belt;Other (comment) (stedy) Nurse Communication: Mobility status;Other (comment) (on RA on departure)  Activity Tolerance: Patient tolerated treatment well Patient left: in chair;with call bell/phone within reach;with chair alarm set  OT Visit Diagnosis: Unsteadiness on feet (R26.81);Muscle weakness (generalized) (M62.81);Other abnormalities of gait and mobility (R26.89);Ataxia, unspecified (R27.0);Other symptoms and signs involving cognitive function;Hemiplegia and hemiparesis Hemiplegia - Right/Left: Left Hemiplegia - dominant/non-dominant: Non-Dominant                Time: 0981-1914 OT Time Calculation (min): 23 min Charges:  OT General Charges $OT Visit: 1 Visit OT Evaluation $OT Eval Moderate Complexity: 1 Mod  Tyler Deis, OTR/L [facility] Acute Rehabilitation Office: [phone]   Myrla Halsted 01/26/2024, 8:40 AM

## 2024-01-26 NOTE — Evaluation (Signed)
Clinical/Bedside Swallow Evaluation Patient Details  Name: Herbert Chapman MRN: [id number] Date of Birth: Mar 11, 1946  Today's Date: 01/26/2024 Time: SLP Start Time (ACUTE ONLY): 8295 SLP Stop Time (ACUTE ONLY): 0847 SLP Time Calculation (min) (ACUTE ONLY): 23 min  Past Medical History:  Past Medical History:  Diagnosis Date   Anxiety    Arthritis    Cancer (HCC)    squamous cell CA removed from skin   Depression    Diabetes mellitus without complication (HCC)    GERD (gastroesophageal reflux disease)    rare-no meds   Headache    h/o migraines   Hepatitis    Hep C-pt had injections and pill and pt states he does not have hep c anymore   History of kidney stones    h/o age 32   Hypertension    Mental disorder    Pancreatitis 2012   Personal history of tobacco use, presenting hazards to health 11/07/2015   Past Surgical History:  Past Surgical History:  Procedure Laterality Date   CERVICAL SPINE SURGERY     CIRCUMCISION     COLONOSCOPY     EYE SURGERY     bilateral cataract removal; implants   LUMBAR LAMINECTOMY/DECOMPRESSION MICRODISCECTOMY  10/07/2012   Procedure: LUMBAR LAMINECTOMY/DECOMPRESSION MICRODISCECTOMY 2 LEVELS;  Surgeon: Tia Alert, MD;  Location: [facility];  Service: Neurosurgery;  Laterality: Left;  Left Lumbar two-three,lumbar four-five hemilaminectomy   PILONIDAL CYST EXCISION     SPINAL CORD STIMULATOR IMPLANT  2018   [employer]   TONSILLECTOMY     TOTAL SHOULDER ARTHROPLASTY Right 10/13/2019   Procedure: TOTAL SHOULDER ARTHROPLASTY;  Surgeon: Lyndle Herrlich, MD;  Location: [facility] ORS;  Service: Orthopedics;  Laterality: Right;   HPI:  78 y.o. male presents to [facility] hospital on 01/22/2024 with L weakness. 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 Pt intubated on 2/14; extubated 2/15. PMH includes HTN, DMII, pancreatitis, hepatitis C, GERD, migraines, anxiety, depression, cervical spine surgery.    Assessment / Plan / Recommendation  Clinical  Impression  Pt presents with generally functional appearing swallowing, but does have intermittent signs concerning for aspiration. Coughing is noted at times with ice and water. When attempting three ounces of water he does drink it consecutively, but then sounds audibly wet and coughs. Although he does elicit a spontaneous cough, he continues to sound wet until he performs several additional volitional coughs, being cued to do so by SLP. Question sensation in light of acute stroke and intubation. Note that pt also reports a possible baseline dysphagia, saying that he had COVID several months ago and that his breathing never felt the same after. In this time, he has also been coughing with thin liquids at home. Given possible acute on chronic issues, recommend proceeding with MBS prior to initiating diet. Until this can be scheduled, could allow ice chips after oral care with nursing.  SLP Visit Diagnosis: Dysphagia, unspecified (R13.10)    Aspiration Risk   (tba)    Diet Recommendation NPO;Ice chips PRN after oral care    Medication Administration: Via alternative means (has cortrak; could consider crushed in puree if access is lost)    Other  Recommendations Oral Care Recommendations: Oral care QID;Oral care prior to ice chip/H20    Recommendations for follow up therapy are one component of a multi-disciplinary discharge planning process, led by the attending physician.  Recommendations may be updated based on patient status, additional functional criteria and insurance authorization.  Follow up Recommendations  Acute inpatient rehab (3hours/day)      Assistance Recommended at Discharge    Functional Status Assessment Patient has had a recent decline in their functional status and demonstrates the ability to make significant improvements in function in a reasonable and predictable amount of time.  Frequency and Duration            Prognosis Prognosis for improved oropharyngeal function:  Good      Swallow Study   General HPI: 78 y.o. male presents to [facility] hospital on 01/22/2024 with L weakness. 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 Pt intubated on 2/14; extubated 2/15. PMH includes HTN, DMII, pancreatitis, hepatitis C, GERD, migraines, anxiety, depression, cervical spine surgery. Type of Study: Bedside Swallow Evaluation Previous Swallow Assessment: none in chart Diet Prior to this Study: NPO;Cortrak/Small bore NG tube Temperature Spikes Noted: No Respiratory Status: Room air History of Recent Intubation: Yes Total duration of intubation (days): 1 days Date extubated: 01/24/24 Behavior/Cognition: Alert;Cooperative;Pleasant mood Oral Cavity Assessment: Within Functional Limits Oral Care Completed by SLP: No Oral Cavity - Dentition: Adequate natural dentition;Missing dentition Vision: Functional for self-feeding Self-Feeding Abilities: Able to feed self;Needs assist Patient Positioning: Upright in chair Baseline Vocal Quality: Normal Volitional Cough: Strong Volitional Swallow: Able to elicit    Oral/Motor/Sensory Function Overall Oral Motor/Sensory Function:  (subtle R facial weakness)   Ice Chips Ice chips: Impaired Presentation: Spoon Pharyngeal Phase Impairments: Throat Clearing - Immediate   Thin Liquid Thin Liquid: Impaired Presentation: Cup;Self Fed;Straw;Spoon Pharyngeal  Phase Impairments: Cough - Immediate    Nectar Thick Nectar Thick Liquid: Not tested   Honey Thick Honey Thick Liquid: Not tested   Puree Puree: Within functional limits Presentation: Self Fed;Spoon   Solid     Solid: Within functional limits Presentation: Self Fed      Mahala Menghini., M.A. CCC-SLP Acute Rehabilitation Services Office [phone]  Secure chat preferred  01/26/2024,9:26 AM

## 2024-01-26 NOTE — Consult Note (Signed)
Physical Medicine and Rehabilitation Consult Reason for Consult:Right intraparenchymal hemorrhage Referring Physician: Roda Shutters   HPI: Herbert Chapman is a 78 y.o. male with prior history of diabetes type 2, hypertension, and migraines who presented to [facility] due to left-sided weakness.  The patient had a fall at home.  Upon arrival to the [facility] ED, CT of the head showed right intraparenchymal hemorrhage at the thalamocapsular junction with intraventricular extension.  Initial blood pressure systolic was greater than 200 and was given IV labetalol and started on a Cleviprex drip.  CTA did not show any signs of arteriovenous malformation or aneurysm.  The patient was then transferred to [facility] several hours later and was sent to the ICU.  The patient was intubated due to poor airway protection, spiked a temp of 101.3 on 01/23/2024.  IV Zosyn was started.  Patient followed by critical care.  The patient has a spinal cord stimulator and therefore cannot have an MRI.  Patient was extubated on 01/24/2024.  Patient remained sedated with fentanyl and Precedex Physical therapy evaluated the patient on 01/25/2024.  Patient requiring max assist with bed mobility max assist assist stand left-sided lean.  Ataxia was noted on the left side Blood pressure remained difficult to control on amlodipine 10 mg/day doxazosin 4 mg/day hydralazine 100 mg every 8 hours and metoprolol 50 mg twice daily, the patient had difficulty with CBG control as well.01/26/2024 still having difficulty controlling blood pressure continuing with Cleviprex titrating from 2 mg/h to 12 mg/h based on goals. OT evaluation on 01/26/2024, requiring mod to total assist x 2 for basic ADLs.  Seen by speech-language pathology on 01/26/2024, coughing and wet voice with liquid trials, planning MBS PT eval on 01/26/2024, +2 mod assist sit to stand using a steady, still not ambulatory  Review of Systems  Constitutional:   Negative for chills and fever.  HENT:  Negative for hearing loss and nosebleeds.   Eyes:  Negative for pain, discharge and redness.  Respiratory:  Negative for cough, hemoptysis, sputum production, shortness of breath, wheezing and stridor.   Cardiovascular:  Negative for chest pain and leg swelling.  Gastrointestinal:  Positive for diarrhea. Negative for abdominal pain, nausea and vomiting.  Genitourinary:  Negative for frequency, hematuria and urgency.  Musculoskeletal:  Positive for back pain. Negative for joint pain.  Skin:  Negative for itching.  Neurological:  Positive for sensory change and speech change. Negative for dizziness and headaches.  Endo/Heme/Allergies:  Does not bruise/bleed easily.  Psychiatric/Behavioral:  Positive for memory loss. Negative for hallucinations.    Past Medical History:  Diagnosis Date   Anxiety    Arthritis    Cancer (HCC)    squamous cell CA removed from skin   Depression    Diabetes mellitus without complication (HCC)    GERD (gastroesophageal reflux disease)    rare-no meds   Headache    h/o migraines   Hepatitis    Hep C-pt had injections and pill and pt states he does not have hep c anymore   History of kidney stones    h/o age 78   Hypertension    Mental disorder    Pancreatitis 2012   Personal history of tobacco use, presenting hazards to health 11/07/2015   Past Surgical History:  Procedure Laterality Date   CERVICAL SPINE SURGERY     CIRCUMCISION     COLONOSCOPY     EYE SURGERY     bilateral cataract removal;  implants   LUMBAR LAMINECTOMY/DECOMPRESSION MICRODISCECTOMY  10/07/2012   Procedure: LUMBAR LAMINECTOMY/DECOMPRESSION MICRODISCECTOMY 2 LEVELS;  Surgeon: Tia Alert, MD;  Location: [facility];  Service: Neurosurgery;  Laterality: Left;  Left Lumbar two-three,lumbar four-five hemilaminectomy   PILONIDAL CYST EXCISION     SPINAL CORD STIMULATOR IMPLANT  2018   [employer]   TONSILLECTOMY     TOTAL SHOULDER  ARTHROPLASTY Right 10/13/2019   Procedure: TOTAL SHOULDER ARTHROPLASTY;  Surgeon: Lyndle Herrlich, MD;  Location: [facility] ORS;  Service: Orthopedics;  Laterality: Right;   No family history on file. Social History:  reports that he quit smoking about 5 years ago. His smoking use included cigarettes. He started smoking about 35 years ago. He has a 30 pack-year smoking history. He has never used smokeless tobacco. He reports that he does not currently use alcohol. He reports that he does not use drugs. Allergies: No Known Allergies Medications Prior to Admission  Medication Sig Dispense Refill   aspirin 81 MG tablet Take 162 mg by mouth daily.     atorvastatin (LIPITOR) 10 MG tablet Take 10 mg by mouth every morning.      doxazosin (CARDURA) 4 MG tablet Take 4 mg by mouth at bedtime.     FLUoxetine (PROZAC) 20 MG capsule Take 60 mg by mouth every morning.      lisinopril (PRINIVIL,ZESTRIL) 40 MG tablet Take 40 mg by mouth every morning.      metFORMIN (GLUCOPHAGE) 1000 MG tablet Take 1,000 mg by mouth 2 (two) times daily with a meal.     metoprolol succinate (TOPROL-XL) 100 MG 24 hr tablet Take 100 mg by mouth every morning. Take with or immediately following a meal.      oxyCODONE (OXY IR/ROXICODONE) 5 MG immediate release tablet Take 5 mg by mouth 5 (five) times daily.      Home: Home Living Family/patient expects to be discharged to:: Private residence Living Arrangements: Spouse/significant other (partner, [name]) Available Help at Discharge: Family, Available 24 hours/day Type of Home: Mobile home Home Access: Stairs to enter [employer] of Steps: 5 Entrance Stairs-Rails: Right, Left, Can reach both Home Layout: One level Bathroom Shower/Tub: Engineer, manufacturing systems: Standard Home Equipment: Information systems manager, Medical laboratory scientific officer - single point, [employer] - quad  Functional History: Prior Function Prior Level of Function : Independent/Modified Independent Functional Status:  Mobility: Bed  Mobility Overal bed mobility: Needs Assistance Bed Mobility: Supine to Sit Supine to sit: Mod assist Sit to supine: Max assist General bed mobility comments: assist with LLE and trunk, increased time to scoot to EOB Transfers Overall transfer level: Needs assistance Equipment used: Ambulation equipment used Transfers: Sit to/from Stand, Bed to chair/wheelchair/BSC Sit to Stand: Mod assist, +2 physical assistance, +2 safety/equipment, From elevated surface, Via lift equipment Bed to/from chair/wheelchair/BSC transfer type:: Via Lift equipment Transfer via Lift Equipment: Stedy General transfer comment: +2 mod to power up from bed in stedy. Heavy L lean but able to initiate midline with verbal cues. Mod assist to maintain midline posture during stedy transfer to recliner. Ambulation/Gait General Gait Details: unable to safely progress due to heavy L push    ADL: ADL Overall ADL's : Needs assistance/impaired Eating/Feeding: NPO Grooming: Maximal assistance, Sitting Grooming Details (indicate cue type and reason): L lateral lean sitting EOB, difficulty with coordination of LUE to serve as helper hand for RUE Upper Body Bathing: Maximal assistance, Sitting Lower Body Bathing: Total assistance, Sit to/from stand, Sitting/lateral leans, +2 for physical assistance, +2 for safety/equipment  Upper Body Dressing : Maximal assistance, Sitting Lower Body Dressing: Total assistance, Sit to/from stand, +2 for safety/equipment, +2 for physical assistance Toilet Transfer: +2 for physical assistance, +2 for safety/equipment, Moderate assistance  Cognition: Cognition Orientation Level: Oriented to place, Oriented to person, Oriented to situation, Disoriented to time Cognition Arousal: Alert Behavior During Therapy: [facility] for tasks assessed/performed  Blood pressure (!) 166/78, pulse 78, temperature 98.7 F (37.1 C), temperature source Oral, resp. rate 19, height 5\' 7"  (1.702 m), weight 83 kg, SpO2  94%. Physical Exam Vitals and nursing note reviewed.  Constitutional:      Appearance: Normal appearance.  HENT:     Head: Normocephalic and atraumatic.  Eyes:     General: No visual field deficit.    Extraocular Movements: Extraocular movements intact.     Conjunctiva/sclera: Conjunctivae normal.     Pupils: Pupils are equal, round, and reactive to light.  Cardiovascular:     Rate and Rhythm: Normal rate and regular rhythm.     Heart sounds: Normal heart sounds. No murmur heard. Pulmonary:     Effort: Pulmonary effort is normal. No respiratory distress.     Breath sounds: No wheezing or rales.  Abdominal:     General: Bowel sounds are normal. There is distension.     Tenderness: There is no abdominal tenderness.  Genitourinary:    Comments: Male [employer] Musculoskeletal:        General: No swelling or tenderness.     Cervical back: Normal range of motion. No tenderness.     Right lower leg: No edema.     Left lower leg: No edema.  Skin:    General: Skin is warm and dry.  Neurological:     Mental Status: He is alert. He is disoriented.     Cranial Nerves: Dysarthria present.     Sensory: Sensory deficit present.     Motor: Weakness present. No tremor.     Coordination: Coordination abnormal. Finger-Nose-Finger Test abnormal.     Gait: Gait abnormal.     Comments: Patient is oriented to person, needed cues for place, not oriented to time Dense hemisensory deficit left upper extremity.  Intact sensation left lower extremity as well as on the right side.  Dysmetria left side upper greater than lower limb due to sensory ataxia Tone is normal     Results for orders placed or performed during the hospital encounter of 01/22/24 (from the past 24 hours)  Glucose, capillary     Status: Abnormal   Collection Time: 01/25/24  4:08 PM  Result Value Ref Range   Glucose-Capillary 374 (H) 70 - 99 mg/dL  Glucose, capillary     Status: Abnormal   Collection Time: 01/25/24  7:13 PM   Result Value Ref Range   Glucose-Capillary 378 (H) 70 - 99 mg/dL  Glucose, capillary     Status: Abnormal   Collection Time: 01/25/24 10:03 PM  Result Value Ref Range   Glucose-Capillary 286 (H) 70 - 99 mg/dL  Glucose, capillary     Status: Abnormal   Collection Time: 01/25/24 11:05 PM  Result Value Ref Range   Glucose-Capillary 268 (H) 70 - 99 mg/dL  Glucose, capillary     Status: Abnormal   Collection Time: 01/26/24 12:23 AM  Result Value Ref Range   Glucose-Capillary 219 (H) 70 - 99 mg/dL  Glucose, capillary     Status: Abnormal   Collection Time: 01/26/24  2:48 AM  Result Value Ref Range   Glucose-Capillary 172 (H)  70 - 99 mg/dL  Glucose, capillary     Status: Abnormal   Collection Time: 01/26/24  3:13 AM  Result Value Ref Range   Glucose-Capillary 185 (H) 70 - 99 mg/dL  CBC     Status: Abnormal   Collection Time: 01/26/24  4:38 AM  Result Value Ref Range   WBC 7.2 4.0 - 10.5 K/uL   RBC 4.24 4.22 - 5.81 MIL/uL   Hemoglobin 12.8 (L) 13.0 - 17.0 g/dL   HCT 16.1 (L) 09.6 - 04.5 %   MCV 91.7 80.0 - 100.0 fL   MCH 30.2 26.0 - 34.0 pg   MCHC 32.9 30.0 - 36.0 g/dL   RDW 40.9 81.1 - 91.4 %   Platelets 155 150 - 400 K/uL   nRBC 0.0 0.0 - 0.2 %  Basic metabolic panel     Status: Abnormal   Collection Time: 01/26/24  4:38 AM  Result Value Ref Range   Sodium 143 135 - 145 mmol/L   Potassium 4.1 3.5 - 5.1 mmol/L   Chloride 108 98 - 111 mmol/L   CO2 24 22 - 32 mmol/L   Glucose, Bld 241 (H) 70 - 99 mg/dL   BUN 31 (H) 8 - 23 mg/dL   Creatinine, Ser 7.82 0.61 - 1.24 mg/dL   Calcium 9.4 8.9 - 95.6 mg/dL   GFR, Estimated >21 >30 mL/min   Anion gap 11 5 - 15  Glucose, capillary     Status: Abnormal   Collection Time: 01/26/24  7:31 AM  Result Value Ref Range   Glucose-Capillary 269 (H) 70 - 99 mg/dL  Glucose, capillary     Status: Abnormal   Collection Time: 01/26/24 11:26 AM  Result Value Ref Range   Glucose-Capillary 273 (H) 70 - 99 mg/dL   DG CHEST PORT 1 VIEW Result  Date: 01/24/2024 CLINICAL DATA:  Fever. EXAM: PORTABLE CHEST 1 VIEW COMPARISON:  Radiograph yesterday FINDINGS: Interval extubation. Weighted enteric tube tip below the diaphragm, not included in the field of view. Persistent left lung base opacity with mild improvement. Minimal right infrahilar opacity. Stable cardiomegaly, unchanged mediastinal contours. No pleural effusion or pneumothorax. Spinal stimulator in place. IMPRESSION: 1. Interval extubation. 2. Persistent left lung base opacity with mild improvement. Minimal right infrahilar opacity. 3. Stable cardiomegaly. Electronically Signed   By: Narda Rutherford M.D.   On: 01/24/2024 18:27    Assessment/Plan: Diagnosis: Right thalamic capsular intracranial hemorrhage Does the need for close, 24 hr/day medical supervision in concert with the patient's rehab needs make it unreasonable for this patient to be served in a less intensive setting? Yes Co-Morbidities requiring supervision/potential complications:  -Malignant hypertension, left hemiparesis, dysphagia n.p.o. on tube feeds, dense hemisensory loss on left side, incontinence of bowel and bladder Due to bladder management, bowel management, safety, skin/wound care, disease management, medication administration, pain management, and patient education, does the patient require 24 hr/day rehab nursing? Yes Does the patient require coordinated care of a physician, rehab nurse, therapy disciplines of PT, OT, speech to address physical and functional deficits in the context of the above medical diagnosis(es)? Yes Addressing deficits in the following areas: balance, endurance, locomotion, strength, transferring, bowel/bladder control, bathing, dressing, feeding, grooming, toileting, cognition, speech, language, swallowing, and psychosocial support Can the patient actively participate in an intensive therapy program of at least 3 hrs of therapy per day at least 5 days per week? Yes The potential for  patient to make measurable gains while on inpatient rehab is good Anticipated functional outcomes  upon discharge from inpatient rehab are mod assist  with PT, mod assist with OT, mod assist with SLP. Estimated rehab length of stay to reach the above functional goals is: 22 to 26 days Anticipated discharge destination:  Home with 24/7 assistance Overall Rehab/Functional Prognosis: good  POST ACUTE RECOMMENDATIONS: This patient's condition is appropriate for continued rehabilitative care in the following setting: CIR Patient has agreed to participate in recommended program. Yes Note that insurance prior authorization may be required for reimbursement for recommended care.  Comment: Patient states that he has 24/7 assistance at home   MEDICAL RECOMMENDATIONS: Will need blood pressure controlled off IV medications prior to CIR Will need better control of diabetes   I have personally performed a face to face diagnostic evaluation of this patient. Additionally, I have examined the patient's medical record including any pertinent labs and radiographic images.    Thanks,  Erick Colace, MD 01/26/2024

## 2024-01-26 NOTE — Progress Notes (Addendum)
Inpatient Rehab Coordinator Note:  Addendum: 3:15 pm-Spoke with SO, Ronald. Windy Fast states he works part time ( 20 hours a week) and will not be able to provide 24/7 assist. He will look into caregiver for time when he is at work. Otherwise, patient will need NH placement. Will follow up with Windy Fast tomorrow or wed.    I met with patient at bedside to discuss CIR recommendations and goals/expectations of CIR stay.  We reviewed 3 hrs/day of therapy, physician follow up, and average length of stay 2 weeks (dependent upon progress) with goals of mod I.  Patient lives with significant other, Windy Fast. I called him to discuss but had to leave voicemail. Will continue to follow for potential CIR admission.  Rehab Admissons Coordinator [place], [place], [place] [phone]

## 2024-01-26 NOTE — Progress Notes (Signed)
STROKE TEAM PROGRESS NOTE    SIGNIFICANT HOSPITAL EVENTS 2/13 patient admitted with right-sided IPH with IVH 2/14 patient intubated due to aspiration and inability to protect airway 2/15 patient extubated  INTERIM HISTORY/SUBJECTIVE RN is at the bedside. Pt sitting in chair, awake alert, smiling to provider. Neuro stable and no acute event overnight. Tolerating RA good, still on TF. BP under control with cleviprex. Will add losartan today.   CBC    Component Value Date/Time   WBC 7.2 01/26/2024 0438   RBC 4.24 01/26/2024 0438   HGB 12.8 (L) 01/26/2024 0438   HGB 17.2 11/30/2013 0301   HCT 38.9 (L) 01/26/2024 0438   HCT 49.6 11/30/2013 0301   PLT 155 01/26/2024 0438   PLT 184 11/30/2013 0301   MCV 91.7 01/26/2024 0438   MCV 92 11/30/2013 0301   MCH 30.2 01/26/2024 0438   MCHC 32.9 01/26/2024 0438   RDW 13.3 01/26/2024 0438   RDW 13.8 11/30/2013 0301   LYMPHSABS 0.7 01/23/2024 0539   LYMPHSABS 2.0 04/17/2012 1253   MONOABS 1.4 (H) 01/23/2024 0539   MONOABS 0.9 04/17/2012 1253   EOSABS 0.0 01/23/2024 0539   EOSABS 0.1 04/17/2012 1253   BASOSABS 0.0 01/23/2024 0539   BASOSABS 0.1 04/17/2012 1253   BASOSABS 1 04/17/2012 1253    BMET    Component Value Date/Time   NA 143 01/26/2024 0438   NA 131 (L) 11/30/2013 0301   K 4.1 01/26/2024 0438   K 3.9 11/30/2013 0301   CL 108 01/26/2024 0438   CL 95 (L) 11/30/2013 0301   CO2 24 01/26/2024 0438   CO2 29 11/30/2013 0301   GLUCOSE 241 (H) 01/26/2024 0438   GLUCOSE 237 (H) 11/30/2013 0301   BUN 31 (H) 01/26/2024 0438   BUN 22 (H) 11/30/2013 0301   CREATININE 1.02 01/26/2024 0438   CREATININE 0.89 11/30/2013 0301   CALCIUM 9.4 01/26/2024 0438   CALCIUM 9.6 11/30/2013 0301   GFRNONAA >60 01/26/2024 0438   GFRNONAA >60 11/30/2013 0301    IMAGING past 24 hours No results found.   Vitals:   01/26/24 0900 01/26/24 1000 01/26/24 1100 01/26/24 1200  BP: (!) 148/67 (!) 168/73 (!) 149/67 (!) 166/78  Pulse: 84 90 74 78   Resp: 17 18 18 19   Temp:      TempSrc:      SpO2: 95% 94% 93% 94%  Weight:      Height:         PHYSICAL EXAM General: Well-nourished, well-developed elderly patient in no acute distress Psych:  Mood and affect appropriate for situation CV: Regular rate and rhythm on monitor Respiratory: Respirations regular and unlabored on supplemental O2   NEURO:  awake, alert, eyes open, orientated to place, time but stated age 10 instead of 66. No aphasia but paucity of language, following all simple commands. Able to name and repeat. No gaze palsy, tracking bilaterally, visual field full, PERRL. Mild left facial droop and tongue protrusion to the left. RUE and RLE 5/5, LUE 3-/5 with significant ataxia seems out of proportion to the weakness. LLE 3-/5 proximal and knee flexion but distal ankle DF 4/5. Sensation symmetrical bilaterally but seems to have left simultagnosia, R FTN intact, gait not tested.     ASSESSMENT/PLAN  [name] is a 78 y.o. male with history of diabetes, hypertension, migraines and previous smoking admitted for acute onset left-sided weakness.  He was found to have an IPH at the right thalamocapsular junction with IVH on  CT.  Overnight, he had an aspiration event and became febrile and was intubated due to concern for inability to protect airway as well as aspiration pneumonia.   NIH on Admission 5 ICH score 1  ICH:  right Thalamocapsular junction IPH with IVH, etiology: Likely hypertensive Code Stroke CT head 7 mm acute parenchymal hemorrhage centered at right thalamocapsular junction with mild surrounding edema, moderate volume IVH and no evidence of hydrocephalus CTA head & neck no LVO, severe, greater than 90% stenoses in bilateral internal carotid artery origins, no evidence of aneurysm or AVM in the region of the IPH, severe stenosis within right PCA P2 and P3 segments, severe stenosis within left PCA segment, moderate stenosis within right ICA cavernous  segment Repeat head CT 2/14 stable IPH with no increase in hydrocephalus MRI unable to perform due to spinal stimulator 2D Echo EF 70 to 75% LDL 10 HgbA1c 6.6 VTE prophylaxis -Lovenox aspirin 81 mg daily prior to admission, now on No antithrombotic secondary to IPH Therapy recommendations:  CIR Disposition: Pending  Hypertension Home meds: Doxazosin 4 mg daily, lisinopril 40 mg daily, metoprolol 100 mg daily Continues to require Cleviprex, wean off as able Now on amlodipine 10, doxazosin 4, hydralazine 100 every 8h Continue metoprolol 50 bid Add losartan 100 Qd Blood Pressure Goal: SBP less than 160  Long-term BP goal normotensive  Hyperlipidemia Home meds: Atorvastatin 10 mg daily LDL 10, goal < 70 Resume lipitor 10 continue statin at discharge  Diabetes type II Controlled Home meds: Metformin 1000 mg twice daily HgbA1c 6.6, goal < 7.0 CBGs SSI Hyperglycemia persists Consult DM coordinator Recommend close follow-up with PCP for better DM control  Respiratory failure  Aspiration pneumonia Patient was intubated 2/14 after aspiration event Extubated 2/15 Tolerating well so far Antibiotics per CCM On Unasyn Tmax 100.3-> afebrile Leukocytosis WBC 6.4--19.9--10.5--7.2  Dysphagia Patient has post-stroke dysphagia SLP consulted On tube feeding at 55 cc/h Pending further speech evaluation  Other Stroke Risk Factors Advanced age Migraines  Other Active Problems AKI, creatinine 1.12--1.53--1.56--1.31--1.02 Tachycardia, HR 110s->normalized, back on metoprolol  Hospital day # 4    Marvel Plan, MD PhD Stroke Neurology 01/26/2024 1:20 PM  This patient is critically ill due to ICH, respiratory failure, AKI, dysphagia and at significant risk of neurological worsening, death form hematoma expansion, cerebral edema, renal failure, aspiration and sepsis. This patient's care requires constant monitoring of vital signs, hemodynamics, respiratory and cardiac monitoring,  review of multiple databases, neurological assessment, discussion with family, other specialists and medical decision making of high complexity. I spent 35 minutes of neurocritical care time in the care of this patient.    To contact Stroke Continuity provider, please refer to [email or web address]. After hours, contact General Neurology

## 2024-01-26 NOTE — Progress Notes (Signed)
Physical Therapy Treatment Patient Details Name: Herbert Chapman MRN: [id number] DOB: July 28, 1946 Today's Date: 01/26/2024   History of Present Illness 78 y.o. male presents to [facility] hospital on 01/22/2024 with L weakness. 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 Pt intubated on 2/14-2/15. PMH includes HTN, DMII, pancreatitis, hepatitis C, GERD, migraines, anxiety, depression.    PT Comments  Pt seen in session with OT to progress mobility OOB. He required mod assist bed mobility, +2 mod assist sit to stand in stedy from elevated surface, and dependent stedy transfer bed to recliner. L lean in sit progressing to heavy L push in stance. Able to initiate correction to midline with verbal cues but requires mod assist to maintain. Pt in recliner with feet elevated at end of session. Pt on 2L O2 on arrival. Mobilized on RA with SpO2 maintained >95%. Remained on RA at end of session.     If plan is discharge home, recommend the following:     Can travel by private vehicle        Equipment Recommendations  Other (comment) (TBD)    Recommendations for Other Services Rehab consult     Precautions / Restrictions Precautions Precautions: Fall;Other (comment) Precaution/Restrictions Comments: L hemi, coretrak, foley, flexiseal     Mobility  Bed Mobility Overal bed mobility: Needs Assistance Bed Mobility: Supine to Sit     Supine to sit: Mod assist     General bed mobility comments: assist with LLE and trunk, increased time to scoot to EOB    Transfers Overall transfer level: Needs assistance Equipment used: Ambulation equipment used Transfers: Sit to/from Stand, Bed to chair/wheelchair/BSC Sit to Stand: Mod assist, +2 physical assistance, +2 safety/equipment, From elevated surface, Via lift equipment           General transfer comment: +2 mod to power up from bed in stedy. Heavy L lean but able to initiate midline with verbal cues. Mod assist to maintain midline posture during stedy  transfer to recliner. Transfer via Lift Equipment: Stedy  Ambulation/Gait                   Stairs             Wheelchair Mobility     Tilt Bed    Modified Rankin (Stroke Patients Only) Modified Rankin (Stroke Patients Only) Pre-Morbid Rankin Score: No symptoms Modified Rankin: Severe disability     Balance Overall balance assessment: Needs assistance Sitting-balance support: Single extremity supported, Feet supported Sitting balance-Leahy Scale: Poor Sitting balance - Comments: L lean, able to correct with min verbal/tactile cues Postural control: Left lateral lean Standing balance support: Bilateral upper extremity supported, During functional activity Standing balance-Leahy Scale: Zero Standing balance comment: requires stedy and external assist                            Communication Communication Communication: Impaired Factors Affecting Communication: Difficulty expressing self  Cognition Arousal: Alert Behavior During Therapy: WFL for tasks assessed/performed   PT - Cognitive impairments: Awareness, Problem solving, Safety/Judgement, Sequencing, Memory                         Following commands: Intact      Cueing Cueing Techniques: Verbal cues, Tactile cues  Exercises      General Comments General comments (skin integrity, edema, etc.): SpO2 stable on RA      Pertinent Vitals/Pain Pain Assessment Pain Assessment:  No/denies pain    Home Living Family/patient expects to be discharged to:: Private residence Living Arrangements: Spouse/significant other (partner, [name]) Available Help at Discharge: Family;Available 24 hours/day Type of Home: Mobile home Home Access: Stairs to enter Entrance Stairs-Rails: Right;Left;Can reach both Entrance Stairs-Number of Steps: 5   Home Layout: One level Home Equipment: Shower seat;Cane - single point;Cane - quad      Prior Function            PT Goals (current goals can  now be found in the care plan section) Acute Rehab PT Goals Patient Stated Goal: to walk Progress towards PT goals: Progressing toward goals    Frequency    Min 1X/week      PT Plan      Co-evaluation              AM-PAC PT "6 Clicks" Mobility   Outcome Measure  Help needed turning from your back to your side while in a flat bed without using bedrails?: A Lot Help needed moving from lying on your back to sitting on the side of a flat bed without using bedrails?: A Lot Help needed moving to and from a bed to a chair (including a wheelchair)?: Total Help needed standing up from a chair using your arms (e.g., wheelchair or bedside chair)?: Total Help needed to walk in hospital room?: Total Help needed climbing 3-5 steps with a railing? : Total 6 Click Score: 8    End of Session Equipment Utilized During Treatment: Gait belt Activity Tolerance: Patient tolerated treatment well Patient left: in chair;with call bell/phone within reach;with chair alarm set Nurse Communication: Mobility status PT Visit Diagnosis: Other abnormalities of gait and mobility (R26.89);Other symptoms and signs involving the nervous system (R29.898)     Time: 9147-8295 PT Time Calculation (min) (ACUTE ONLY): 24 min  Charges:    $Therapeutic Activity: 8-22 mins PT General Charges $$ ACUTE PT VISIT: 1 Visit                     Ferd Glassing., PT  Office # [phone]    Ilda Foil 01/26/2024, 10:08 AM

## 2024-01-26 NOTE — Progress Notes (Signed)
NAME:  Herbert Chapman, MRN:  [id number], DOB:  September 11, 1946, LOS: 4 ADMISSION DATE:  01/22/2024, CONSULTATION DATE:  01/23/2024 REFERRING MD:  Dr Amada Jupiter, CHIEF COMPLAINT: Respiratory distress, status post right intraparenchymal hemorrhage  History of Present Illness:  78 year old man who presented to [facility] 2/13 as a transfer from [facility] for L-sided weakness. Code Stroke called. PMHx significant for HTN, T2DM, pancreatitis, Hepatitis C (s/p treatment with PEG-Intron/ribavirin), GERD, former tobacco use, migraines, anxiety/depression.   Patient initially presented to [facility] as a transfer from [facility] for acute onset of L-sided weakness; Code Stroke was called. LKW 0300 2/13 at which time patient had a fall without head trauma. CT Head demonstrated 2.1 x 2.1 x 3.1cm (7mL) acute APH of R thalamocapsular junction with mild surrounding edema. CTA Head/Neck negative for LVO or vascular malformation but demonstrating intracranial atherosclerotic disease with multifocal stenoses (right PCA P2-3, L PCA P3, R cavernous ICA, paraclinoid L ICA). Cleviprex was started for BP control.   Repeat CT Head 2/13 without significant change in R thalamic IPH, slight increase in IVH. On 2/14AM, patient was noted to have increased dysarthria and confusion with worsening L-sided hemiparesis. Repeat CT Head was completed demonstrating mild enlargement of IPH and mild hydrocephalus. Patient continued to decompensate from a respiratory standpoint with concern for aspiration; he became tachypneic and hypoxic with markedly increased O2 requirements and PCCM was consulted for evaluation. He was subsequently intubated for further airway protection.   PCCM to follow for ventilator management.  Pertinent  Medical History   Past Medical History:  Diagnosis Date   Anxiety    Arthritis    Cancer (HCC)    squamous cell CA removed from skin   Depression    Diabetes mellitus without complication (HCC)    GERD (gastroesophageal reflux disease)     rare-no meds   Headache    h/o migraines   Hepatitis    Hep C-pt had injections and pill and pt states he does not have hep c anymore   History of kidney stones    h/o age 58   Hypertension    Mental disorder    Pancreatitis 2012   Personal history of tobacco use, presenting hazards to health 11/07/2015   Significant Hospital Events: Including procedures, antibiotic start and stop dates in addition to other pertinent events   2/13 - Presented to [facility] as a transfer from [facility] for L-sided weakness, Code Stroke. CT Head with R thalamic IPH. Repeat CT Head later in the day grossly unchanged. 2/14 - Early AM, increased dysarthria/confusion and markedly increased O2 requirements. Concern for aspiration. PCCM called for assessment; patient was subsequently intubated. 2/15-extubated  Interim History / Subjective:  No overnight events Awake alert, sitting in the chair Denies any significant pain or discomfort at present Denies shortness of breath  Objective   Blood pressure (!) 152/70, pulse 94, temperature 98.7 F (37.1 C), temperature source Oral, resp. rate 19, height 5\' 7"  (1.702 m), weight 83 kg, SpO2 95%.        Intake/Output Summary (Last 24 hours) at 01/26/2024 0929 Last data filed at 01/26/2024 0800 Gross per 24 hour  Intake 2249.35 ml  Output 1850 ml  Net 399.35 ml   Filed Weights   01/24/24 0500 01/25/24 0500 01/26/24 0500  Weight: 82.6 kg 83.1 kg 83 kg   Examination: General: Chronically ill-appearing, does not appear to be in distress HENT: Moist oral mucosa Lungs: Clear breath sounds auscultation bilaterally Cardiovascular: S1-S2 appreciated Abdomen: Soft, bowel sounds appreciated Extremities:  No clubbing, no edema Neuro: Alert and oriented x 3 GU:   I reviewed nursing notes, last 24 h vitals and pain scores, last 48 h intake and output, last 24 h labs and trends, and last 24 h imaging results.  Resolved Hospital Problem list     Assessment & Plan:  Acute  right thalamic intraparenchymal hemorrhage -CT Head demonstrated 2.1 x 2.1 x 3.1cm (7mL) acute APH of R thalamocapsular junction with mild surrounding edema. CTA Head/Neck negative for LVO or vascular malformation but demonstrating intracranial atherosclerotic disease with multifocal stenoses.  -Repeat CT 2/15-stable hematoma on the right thalamic,  intraventricular with subarachnoid extension -Goal systolic blood pressure less than 160 -On Cleviprex -On amlodipine, as needed hydralazine -Labetalol -Unable to obtain MRI because of a spinal stimulator -Continue neuroprotective measures- normothermia, euglycemia, HOB greater than 30, head in neutral alignment, normocapnia, normoxia  Appreciate speech eval -On tube feeds  Acute hypoxemic respiratory failure -Extubated -Pulmonary toileting -On Unasyn, plan for 5 days of treatment-2 and 2/18  Acute kidney injury -Maintain renal perfusion -Avoid nephrotoxic medications  Hypertension Hyperlipidemia -Home statin -On metoprolol 50 twice daily per tube  Type 2 diabetes -Continue SSI -Home metformin on hold -Goal sugar less than 180 -On Semglee  4 GERD -Continue PPI  History of anxiety depression Chronic back pain -Spinal stimulator in place -home Prozac resumed  Best Practice (right click and "Reselect all SmartList Selections" daily)   Diet/type: tubefeeds DVT prophylaxis LMWH Pressure ulcer(s): N/A GI prophylaxis: H2B Lines: N/A Foley:  N/A Code Status:  full code Last date of multidisciplinary goals of care discussion [per primary]  Labs   CBC: Recent Labs  Lab 01/22/24 0832 01/23/24 0428 01/23/24 0539 01/23/24 0556 01/24/24 0523 01/25/24 0417 01/26/24 0438  WBC 6.4  --  19.9*  --  10.5 7.2 7.2  NEUTROABS 4.4  --  17.7*  --   --   --   --   HGB 14.0   < > 15.0 13.9 12.1* 12.8* 12.8*  HCT 42.6   < > 43.2 41.0 36.2* 39.0 38.9*  MCV 91.0  --  88.5  --  90.0 92.2 91.7  PLT 117*  --  190  --  128* 125* 155   <  > = values in this interval not displayed.    Basic Metabolic Panel: Recent Labs  Lab 01/22/24 0832 01/23/24 0428 01/23/24 0539 01/23/24 0556 01/23/24 1154 01/23/24 1746 01/24/24 0523 01/24/24 1705 01/25/24 0417 01/26/24 0438  NA 135   < > 137 136  --   --  135  --  141 143  K 4.3   < > 4.2 4.2  --   --  4.1  --  3.8 4.1  CL 100  --  101  --   --   --  104  --  110 108  CO2 27  --  20*  --   --   --  22  --  21* 24  GLUCOSE 156*  --  278*  --   --   --  222*  --  279* 241*  BUN 15  --  19  --   --   --  32*  --  27* 31*  CREATININE 1.12  --  1.53*  --   --   --  1.56*  --  1.31* 1.02  CALCIUM 8.7*  --  8.8*  --   --   --  8.2*  --  8.5* 9.4  MG  --   --  1.7  --  1.6* 2.4 2.2 2.1  --   --   PHOS  --   --   --   --  3.6 3.8 3.0 2.4*  --   --    < > = values in this interval not displayed.   GFR: Estimated Creatinine Clearance: 61.5 mL/min (by C-G formula based on SCr of 1.02 mg/dL). Recent Labs  Lab 01/23/24 0539 01/23/24 0854 01/24/24 0523 01/25/24 0417 01/26/24 0438  WBC 19.9*  --  10.5 7.2 7.2  LATICACIDVEN 2.5* 1.9  --   --   --     Liver Function Tests: Recent Labs  Lab 01/22/24 0832 01/23/24 0539  AST 17 26  ALT 19 20  ALKPHOS 87 79  BILITOT 0.6 1.2  PROT 7.1 7.2  ALBUMIN 3.6 3.7   No results for input(s): "LIPASE", "AMYLASE" in the last 168 hours. No results for input(s): "AMMONIA" in the last 168 hours.  ABG    Component Value Date/Time   PHART 7.378 01/23/2024 0556   PCO2ART 37.2 01/23/2024 0556   PO2ART 297 (H) 01/23/2024 0556   HCO3 21.4 01/23/2024 0556   TCO2 22 01/23/2024 0556   ACIDBASEDEF 2.0 01/23/2024 0556   O2SAT 100 01/23/2024 0556     Coagulation Profile: Recent Labs  Lab 01/22/24 0832 01/23/24 0539  INR 1.0 1.2    Cardiac Enzymes: No results for input(s): "CKTOTAL", "CKMB", "CKMBINDEX", "TROPONINI" in the last 168 hours.  HbA1C: Hgb A1c MFr Bld  Date/Time Value Ref Range Status  01/23/2024 05:39 AM 6.6 (H) 4.8 - 5.6  % Final    Comment:    (NOTE) Pre diabetes:          5.7%-6.4%  Diabetes:              >6.4%  Glycemic control for   <7.0% adults with diabetes     CBG: Recent Labs  Lab 01/25/24 2305 01/26/24 0023 01/26/24 0248 01/26/24 0313 01/26/24 0731  GLUCAP 268* 219* 172* 185* 269*    Review of Systems:   Denies any pain or discomfort  Past Medical History:  He,  has a past medical history of Anxiety, Arthritis, Cancer (HCC), Depression, Diabetes mellitus without complication (HCC), GERD (gastroesophageal reflux disease), Headache, Hepatitis, History of kidney stones, Hypertension, Mental disorder, Pancreatitis (2012), and Personal history of tobacco use, presenting hazards to health (11/07/2015).   Surgical History:   Past Surgical History:  Procedure Laterality Date   CERVICAL SPINE SURGERY     CIRCUMCISION     COLONOSCOPY     EYE SURGERY     bilateral cataract removal; implants   LUMBAR LAMINECTOMY/DECOMPRESSION MICRODISCECTOMY  10/07/2012   Procedure: LUMBAR LAMINECTOMY/DECOMPRESSION MICRODISCECTOMY 2 LEVELS;  Surgeon: Tia Alert, MD;  Location: [facility];  Service: Neurosurgery;  Laterality: Left;  Left Lumbar two-three,lumbar four-five hemilaminectomy   PILONIDAL CYST EXCISION     SPINAL CORD STIMULATOR IMPLANT  2018   [employer]   TONSILLECTOMY     TOTAL SHOULDER ARTHROPLASTY Right 10/13/2019   Procedure: TOTAL SHOULDER ARTHROPLASTY;  Surgeon: Lyndle Herrlich, MD;  Location: [facility] ORS;  Service: Orthopedics;  Laterality: Right;     Social History:   reports that he quit smoking about 5 years ago. His smoking use included cigarettes. He started smoking about 35 years ago. He has a 30 pack-year smoking history. He has never used smokeless tobacco. He reports that he does not currently use alcohol. He reports that he does not use drugs.  Family History:  His family history is not on file.   Allergies No Known Allergies   Home Medications  Prior to  Admission medications   Medication Sig Start Date End Date Taking? Authorizing Provider  aspirin 81 MG tablet Take 162 mg by mouth daily.   Yes [provider]  atorvastatin (LIPITOR) 10 MG tablet Take 10 mg by mouth every morning.  02/23/16  Yes [provider]  doxazosin (CARDURA) 4 MG tablet Take 4 mg by mouth at bedtime.   Yes [provider]  FLUoxetine (PROZAC) 20 MG capsule Take 60 mg by mouth every morning.    Yes [provider]  lisinopril (PRINIVIL,ZESTRIL) 40 MG tablet Take 40 mg by mouth every morning.    Yes [provider]  metFORMIN (GLUCOPHAGE) 1000 MG tablet Take 1,000 mg by mouth 2 (two) times daily with a meal.   Yes [provider]  metoprolol succinate (TOPROL-XL) 100 MG 24 hr tablet Take 100 mg by mouth every morning. Take with or immediately following a meal.    Yes [provider]  oxyCODONE (OXY IR/ROXICODONE) 5 MG immediate release tablet Take 5 mg by mouth 5 (five) times daily.   Yes [provider]    The patient is critically ill with multiple organ systems failure and requires high complexity decision making for assessment and support, frequent evaluation and titration of therapies, application of advanced monitoring technologies and extensive interpretation of multiple databases. Critical Care Time devoted to patient care services described in this note independent of APP/resident time (if applicable)  is 30 minutes.   Virl Diamond MD [facility] Personal pager: See Amion If unanswered, please page CCM On-call: #[phone]

## 2024-01-26 NOTE — PMR Pre-admission (Shared)
PMR Admission Coordinator Pre-Admission Assessment  Patient: Herbert Chapman is an 78 y.o., male MRN: [id number] DOB: 02-22-1946 Height: 5\' 7"  (170.2 cm) Weight: 83 kg              Insurance Information HMO: ***    PPO: ***     PCP: ***     IPA: ***     80/20: ***     OTHER: *** PRIMARY: [employer]      Policy#: [id number]  Group # [id number]    Subscriber: patient CM Name: ***      Phone#: ***     Fax#: *** Pre-Cert#: ***      Employer: *** Benefits:  Phone #: ***     Name: *** Dolores Hoose. Date: ***     Deduct: ***      Out of Pocket Max: ***      Life Max: ***  CIR: ***      SNF: *** Outpatient: ***     Co-Pay: *** Home Health: ***      Co-Pay: *** DME: ***     Co-Pay: *** Providers: *** SECONDARY: ***      Policy#: ***      Phone#: ***  Financial Counselor: ***      Phone#: ***  The "Data Collection Information Summary" for patients in Inpatient Rehabilitation Facilities with attached "Privacy Act Statement-Health Care Records" was provided and verbally reviewed with: {CHL IP Patient Family VH:[id number]}  Emergency Contact Information Contact Information     Name Relation Home Work Mobile   Dierks Significant other [phone]  [phone]      Other Contacts     Name Relation Home Work Mobile   [place] Brother   [phone]      Current Medical History  Patient Admitting Diagnosis: CVA History of Present Illness: 78 year old man who presented to [facility] 01/22/24 as a transfer from [facility] for L-sided weakness. Code Stroke called. PMHx significant for HTN, T2DM, pancreatitis, Hepatitis C (s/p treatment with PEG-Intron/ribavirin), GERD, former tobacco use, migraines, anxiety/depression. CT Head demonstrated 2.1 x 2.1 x 3.1cm (7mL) acute APH of R thalamocapsular junction with mild surrounding edema. CTA Head/Neck negative for LVO or vascular malformation but demonstrating intracranial atherosclerotic disease with multifocal stenoses (right PCA P2-3, L PCA P3, R cavernous ICA,  paraclinoid L ICA). Cleviprex was started for BP control. Repeat CT Head 2/13 without significant change in R thalamic IPH, slight increase in IVH. On 2/14AM, patient was noted to have increased dysarthria and confusion with worsening L-sided hemiparesis. Repeat CT Head was completed demonstrating mild enlargement of IPH and mild hydrocephalus. Patient continued to decompensate from a respiratory standpoint with concern for aspiration; he became tachypneic and hypoxic with markedly increased O2 requirements and PCCM was consulted for evaluation. He was subsequently intubated for further airway protection. Patient extubated on 01/24/24. Patient unable to have MRI due to spinal stimulator in place. Therapies are recommending intensive rehab.   Complete NIHSS TOTAL: 8 Glasgow Coma Scale Score: 15  Patient's medical record from [facility] has been reviewed by the rehabilitation admission coordinator and physician.  Past Medical History  Past Medical History:  Diagnosis Date   Anxiety    Arthritis    Cancer (HCC)    squamous cell CA removed from skin   Depression    Diabetes mellitus without complication (HCC)    GERD (gastroesophageal reflux disease)    rare-no meds   Headache    h/o migraines   Hepatitis    Hep  C-pt had injections and pill and pt states he does not have hep c anymore   History of kidney stones    h/o age 17   Hypertension    Mental disorder    Pancreatitis 2012   Personal history of tobacco use, presenting hazards to health 11/07/2015    Has the patient had major surgery during 100 days prior to admission? No  Family History  family history is not on file.   Current Medications   Current Facility-Administered Medications:    acetaminophen (TYLENOL) tablet 650 mg, 650 mg, Oral, Q4H PRN **OR** acetaminophen (TYLENOL) 160 MG/5ML solution 650 mg, 650 mg, Per Tube, Q4H PRN, 650 mg at 01/26/24 0343 **OR** acetaminophen (TYLENOL) suppository 650 mg, 650 mg, Rectal, Q4H  PRN, Erick Blinks, MD, 650 mg at 01/23/24 0355   amLODipine (NORVASC) tablet 10 mg, 10 mg, Per Tube, Daily, Micki Riley, MD, 10 mg at 01/26/24 0953   Ampicillin-Sulbactam (UNASYN) 3 g in sodium chloride 0.9 % 100 mL IVPB, 3 g, Intravenous, Q6H, Luciano Cutter, MD, Last Rate: 200 mL/hr at 01/26/24 1206, 3 g at 01/26/24 1206   Chlorhexidine Gluconate Cloth 2 % PADS 6 each, 6 each, Topical, Daily, Erick Blinks, MD, 6 each at 01/26/24 1002   clevidipine (CLEVIPREX) infusion 0.5 mg/mL, 0-21 mg/hr, Intravenous, Continuous, Luciano Cutter, MD, Last Rate: 16 mL/hr at 01/26/24 0951, 8 mg/hr at 01/26/24 0951   doxazosin (CARDURA) tablet 4 mg, 4 mg, Per Tube, Daily, Micki Riley, MD, 4 mg at 01/26/24 0953   enoxaparin (LOVENOX) injection 40 mg, 40 mg, Subcutaneous, Q24H, Marvel Plan, MD, 40 mg at 01/26/24 1002   famotidine (PEPCID) tablet 20 mg, 20 mg, Per Tube, BID, Olalere, Adewale A, MD   feeding supplement (OSMOLITE 1.5 CAL) liquid 1,000 mL, 1,000 mL, Per Tube, Continuous, Luciano Cutter, MD, Last Rate: 55 mL/hr at 01/26/24 0800, Infusion Verify at 01/26/24 0800   feeding supplement (PROSource TF20) liquid 60 mL, 60 mL, Per Tube, BID, Luciano Cutter, MD, 60 mL at 01/26/24 0953   hydrALAZINE (APRESOLINE) injection 5-10 mg, 5-10 mg, Intravenous, Q6H PRN, Marvel Plan, MD, 10 mg at 01/24/24 1628   hydrALAZINE (APRESOLINE) tablet 100 mg, 100 mg, Per Tube, Q8H, de Saintclair Halsted, Cortney E, NP, 100 mg at 01/26/24 0526   insulin aspart (novoLOG) injection 0-15 Units, 0-15 Units, Subcutaneous, Q4H, Harris, Whitney D, NP, 8 Units at 01/26/24 1207   insulin aspart (novoLOG) injection 12 Units, 12 Units, Subcutaneous, Q4H, Olalere, Adewale A, MD, 12 Units at 01/26/24 1212   insulin glargine-yfgn (SEMGLEE) injection 25 Units, 25 Units, Subcutaneous, BID, Olalere, Adewale A, MD, 25 Units at 01/26/24 1228   ipratropium-albuterol (DUONEB) 0.5-2.5 (3) MG/3ML nebulizer solution 3 mL, 3 mL,  Nebulization, Q4H PRN, Harris, Whitney D, NP   labetalol (NORMODYNE) injection 10-20 mg, 10-20 mg, Intravenous, Q2H PRN, Marvel Plan, MD, 20 mg at 01/26/24 1228   losartan (COZAAR) tablet 100 mg, 100 mg, Per Tube, Daily, Gevena Mart A, NP, 100 mg at 01/26/24 1213   metoprolol tartrate (LOPRESSOR) tablet 50 mg, 50 mg, Per Tube, BID, Marvel Plan, MD, 50 mg at 01/26/24 1610   multivitamin with minerals tablet 1 tablet, 1 tablet, Per Tube, Daily, Luciano Cutter, MD, 1 tablet at 01/26/24 0952   ondansetron [facility]) injection 4 mg, 4 mg, Intravenous, Q6H PRN, Marvel Plan, MD, 4 mg at 01/23/24 0349   Oral care mouth rinse, 15 mL, Mouth Rinse, PRN, Erick Blinks, MD  Oral care mouth rinse, 15 mL, Mouth Rinse, Q2H, Harris, Whitney D, NP, 15 mL at 01/26/24 1216   Oral care mouth rinse, 15 mL, Mouth Rinse, PRN, Harris, Whitney D, NP   oxyCODONE (Oxy IR/ROXICODONE) immediate release tablet 5 mg, 5 mg, Per Tube, Q4H PRN, Rejeana Brock, MD, 5 mg at 01/26/24 0343   thiamine (VITAMIN B1) tablet 100 mg, 100 mg, Per Tube, Daily, Luciano Cutter, MD, 100 mg at 01/26/24 2956  Patients Current Diet:  Diet Order             Diet NPO time specified  Diet effective now                   Precautions / Restrictions Precautions Precautions: Fall, Other (comment) Precaution/Restrictions Comments: L hemi, coretrak, foley, flexiseal Restrictions Weight Bearing Restrictions Per Provider Order: No (Simultaneous filing. User may not have seen previous data.)   Has the patient had 2 or more falls or a fall with injury in the past year?No  Prior Activity Level    Prior Functional Level Prior Function Prior Level of Function : Independent/Modified Independent  Self Care: Did the patient need help bathing, dressing, using the toilet or eating?  Independent  Indoor Mobility: Did the patient need assistance with walking from room to room (with or without device)? Independent  Stairs: Did  the patient need assistance with internal or external stairs (with or without device)? Independent  Functional Cognition: Did the patient need help planning regular tasks such as shopping or remembering to take medications? Independent  Patient Information Are you of Hispanic, Latino/a,or Spanish origin?: A. No, not of Hispanic, Latino/a, or Spanish origin What is your race?: A. White Do you need or want an interpreter to communicate with a doctor or health care staff?: 0. No  Patient's Response To:  Health Literacy and Transportation Is the patient able to respond to health literacy and transportation needs?: Yes Health Literacy - How often do you need to have someone help you when you read instructions, pamphlets, or other written material from your doctor or pharmacy?: Never In the past 12 months, has lack of transportation kept you from medical appointments or from getting medications?: No In the past 12 months, has lack of transportation kept you from meetings, work, or from getting things needed for daily living?: No  Journalist, newspaper / Equipment Home Equipment: Shower seat, Medical laboratory scientific officer - single point, [employer] - quad  Prior Device Use: Indicate devices/aids used by the patient prior to current illness, exacerbation or injury? None of the above  Current Functional Level Cognition  Orientation Level: Oriented to place, Oriented to person, Oriented to situation, Disoriented to time    Extremity Assessment (includes Sensation/Coordination)  Upper Extremity Assessment: LUE deficits/detail LUE Deficits / Details: L side weakness (UE>LE), moves against gravity and when focusing on one task able to sustain arm against gravity for short period. pt ataxic. able to make thumbs up, ok sign. Pt with trigger finger in 2nd and 3rd digits he reports is baseline. LUE Coordination: decreased gross motor, decreased fine motor  Lower Extremity Assessment: Defer to PT evaluation LLE Deficits / Details:  3/5, ataxic LLE Sensation: decreased proprioception    ADLs  Overall ADL's : Needs assistance/impaired Eating/Feeding: NPO Grooming: Maximal assistance, Sitting Grooming Details (indicate cue type and reason): L lateral lean sitting EOB, difficulty with coordination of LUE to serve as helper hand for RUE Upper Body Bathing: Maximal assistance, Sitting Lower Body Bathing: Total  assistance, Sit to/from stand, Sitting/lateral leans, +2 for physical assistance, +2 for safety/equipment Upper Body Dressing : Maximal assistance, Sitting Lower Body Dressing: Total assistance, Sit to/from stand, +2 for safety/equipment, +2 for physical assistance Toilet Transfer: +2 for physical assistance, +2 for safety/equipment, Moderate assistance    Mobility  Overal bed mobility: Needs Assistance Bed Mobility: Supine to Sit Supine to sit: Mod assist Sit to supine: Max assist General bed mobility comments: assist with LLE and trunk, increased time to scoot to EOB    Transfers  Overall transfer level: Needs assistance Equipment used: Ambulation equipment used Transfers: Sit to/from Stand, Bed to chair/wheelchair/BSC Sit to Stand: Mod assist, +2 physical assistance, +2 safety/equipment, From elevated surface, Via lift equipment Bed to/from chair/wheelchair/BSC transfer type:: Via Lift equipment Transfer via Lift Equipment: Stedy General transfer comment: +2 mod to power up from bed in stedy. Heavy L lean but able to initiate midline with verbal cues. Mod assist to maintain midline posture during stedy transfer to recliner.    Ambulation / Gait / Stairs / Wheelchair Mobility  Ambulation/Gait General Gait Details: unable to safely progress due to heavy L push    Posture / Balance Dynamic Sitting Balance Sitting balance - Comments: L lean, able to correct with min verbal/tactile cues Balance Overall balance assessment: Needs assistance Sitting-balance support: Single extremity supported, Feet  supported Sitting balance-Leahy Scale: Poor Sitting balance - Comments: L lean, able to correct with min verbal/tactile cues Postural control: Left lateral lean Standing balance support: Bilateral upper extremity supported, During functional activity Standing balance-Leahy Scale: Zero Standing balance comment: requires stedy and external assist    Special needs/care consideration Oxygen *** and Skin ***     Previous Home Environment (from acute therapy documentation) Living Arrangements: Spouse/significant other Available Help at Discharge: Family, Available 24 hours/day Type of Home: Mobile home Home Layout: One level Home Access: Stairs to enter Entrance Stairs-Rails: Right, Left, Can reach both Entrance Stairs-Number of Steps: 5 Bathroom Shower/Tub: Armed forces operational officer Accessibility: No  Discharge Living Setting Plans for Discharge Living Setting: Patient's home, Lives with (comment) (SO) Type of Home at Discharge: Mobile home Discharge Home Layout: One level Discharge Home Access: Stairs to enter Entrance Stairs-Rails: Right, Left, Can reach both Entrance Stairs-Number of Steps: 5 Discharge Bathroom Shower/Tub: Tub/shower unit Discharge Bathroom Toilet: Standard Discharge Bathroom Accessibility: No  Social/Family/Support Systems Anticipated Caregiver: Lynford Humphrey Anticipated Caregiver's Contact Information: [phone] Does Caregiver/Family have Issues with Lodging/Transportation while Pt is in Rehab?: No   Goals Patient/Family Goal for Rehab: Mod I PT, OT, independent with SLP   Decrease burden of Care through IP rehab admission: Othern/a   Possible need for SNF placement upon discharge:not anticipated   Patient Condition: This patient's medical and functional status has changed since the consult dated ______ in which the Rehabilitation Physician determined and documented that the patient was potentially appropriate for  intensive rehabilitative care in an inpatient rehabilitation facility. Issues have been addressed and update has been discussed with Dr. Wynn Banker  and patient now appropriate for inpatient rehabilitation. Will admit to inpatient rehab today.   Preadmission Screen Completed By:  Beryle Beams, 01/26/2024 1:04 PM ______________________________________________________________________   Discussed status with Dr. Marland Kitchenon***at *** and received approval for admission today.  Admission Coordinator:  Beryle Beams, time***/Date***

## 2024-01-27 ENCOUNTER — Inpatient Hospital Stay (HOSPITAL_COMMUNITY): Payer: Medicare HMO

## 2024-01-27 DIAGNOSIS — I619 Nontraumatic intracerebral hemorrhage, unspecified: Secondary | ICD-10-CM | POA: Diagnosis not present

## 2024-01-27 LAB — BASIC METABOLIC PANEL
Anion gap: 12 (ref 5–15)
BUN: 37 mg/dL — ABNORMAL HIGH (ref 8–23)
CO2: 21 mmol/L — ABNORMAL LOW (ref 22–32)
Calcium: 9 mg/dL (ref 8.9–10.3)
Chloride: 110 mmol/L (ref 98–111)
Creatinine, Ser: 1.03 mg/dL (ref 0.61–1.24)
GFR, Estimated: >60 mL/min (ref >60)
Glucose, Bld: 203 mg/dL — ABNORMAL HIGH (ref 70–99)
Potassium: 4 mmol/L (ref 3.5–5.1)
Sodium: 143 mmol/L (ref 135–145)

## 2024-01-27 LAB — CBC
HCT: 41.6 % (ref 39.0–52.0)
Hemoglobin: 13.7 g/dL (ref 13.0–17.0)
MCH: 30.4 pg (ref 26.0–34.0)
MCHC: 32.9 g/dL (ref 30.0–36.0)
MCV: 92.2 fL (ref 80.0–100.0)
Platelets: 154 10*3/uL (ref 150–400)
RBC: 4.51 MIL/uL (ref 4.22–5.81)
RDW: 13.5 % (ref 11.5–15.5)
WBC: 8 10*3/uL (ref 4.0–10.5)
nRBC: 0 % (ref 0.0–0.2)

## 2024-01-27 LAB — GLUCOSE, CAPILLARY
Glucose-Capillary: 154 mg/dL — ABNORMAL HIGH (ref 70–99)
Glucose-Capillary: 154 mg/dL — ABNORMAL HIGH (ref 70–99)
Glucose-Capillary: 170 mg/dL — ABNORMAL HIGH (ref 70–99)
Glucose-Capillary: 206 mg/dL — ABNORMAL HIGH (ref 70–99)
Glucose-Capillary: 259 mg/dL — ABNORMAL HIGH (ref 70–99)
Glucose-Capillary: 265 mg/dL — ABNORMAL HIGH (ref 70–99)

## 2024-01-27 LAB — PHOSPHORUS: Phosphorus: 2.8 mg/dL (ref 2.5–4.6)

## 2024-01-27 LAB — TRIGLYCERIDES: Triglycerides: 70 mg/dL (ref <150)

## 2024-01-27 MED ORDER — INSULIN GLARGINE-YFGN 100 UNIT/ML ~~LOC~~ SOLN
25.0000 [IU] | Freq: Two times a day (BID) | SUBCUTANEOUS | Status: DC
  Administered 2024-01-27 – 2024-01-30 (×6): 25 [IU] via SUBCUTANEOUS
  Filled 2024-01-27 (×7): qty 0.25

## 2024-01-27 MED ORDER — FLUOXETINE HCL 20 MG PO CAPS
60.0000 mg | ORAL_CAPSULE | ORAL | Status: DC
  Administered 2024-01-27 – 2024-01-29 (×3): 60 mg
  Filled 2024-01-27 (×3): qty 3

## 2024-01-27 MED ORDER — PROSOURCE TF20 ENFIT COMPATIBL EN LIQD
60.0000 mL | Freq: Every day | ENTERAL | Status: DC
  Administered 2024-01-28: 60 mL
  Filled 2024-01-27 (×2): qty 60

## 2024-01-27 MED ORDER — OSMOLITE 1.5 CAL PO LIQD
1000.0000 mL | ORAL | Status: DC
  Administered 2024-01-27 – 2024-01-28 (×2): 1000 mL

## 2024-01-27 MED ORDER — OSMOLITE 1.5 CAL PO LIQD: 1000.0000 mL | ORAL | Status: DC

## 2024-01-27 NOTE — Progress Notes (Addendum)
Nutrition Follow-up DOCUMENTATION CODES:  Not applicable  INTERVENTION:  Transition to nocturnal tube feeding via Cortrak Osmolite 1.5, 80mL * 12 hours (8pm-8am) + Prosource 1x/day Provides 1520kcal (meeting 76% of minimum estimated needs), 80g Protein (meeting 73% of minimum estimated needs),  free water  If pt meeting >/= 50% of estimated needs via PO intake, can consider d/c Cortrak  Mighty Shake TID with meals, each supplement provides 330 kcals and 9 grams of protein  NUTRITION DIAGNOSIS:  Moderate Malnutrition related to chronic illness as evidenced by moderate fat depletion, moderate muscle depletion. Remains applicable GOAL:  Patient will meet greater than or equal to 90% of their needs Meeting via tube feed, now diet advancement MONITOR:  TF tolerance  REASON FOR ASSESSMENT:  Consult Enteral/tube feeding initiation and management  ASSESSMENT:  Pt with PMH of DM, HTN, and migraines admitted for nontraumatic ICH.  Pt being monitored for ICH on lovenox and aspirin; HTN on amlodipine, doxazosin, hydralazine, metoprolol, and losartan; respiratory failure aspiration pneumonia on unasyn; and dysphagia followed by SLP with cortrak in place.  2/14 intubated with spiked temperature 101.3, cortrak placed for feeding 2/15 extubated, sedated with fentanyl and Precedex 2/16 PT evaluation- pt needs max assistance 2/17 PT and OT evaluation, requires assist for ADLs; SLP evaluation complete (awaiting MBS)  Spoke with pt and pt's significant other. Pt awake and alert and able to answer questions. Pt reports diarrhea has improved since tube feeding was initiated and states he feels better. Pt completed MBS evaluation this morning (2/18) and SLP recommended advancing diet to Dysphagia 3 with nectar thick liquids. Discussed importance of meeting nutrition needs while admitted. Will monitor PO intake to determine when it is appropriate discontinue tube feedings. Plan to transition to  nocturnal tube feeding and monitor day time PO intake.  Pt reports excessive thirst, discussed drinking liquids when transitioned to dysphagia 3 diet.  Per chart review, PT/OT working with pt on mobility. Pt uses cane outside of home at baseline.  Medications reviewed and include: lipitor, lovenox, novolog Pepcid, Prosource BID, MVI, thiamine Osmolite 1.5 55 mL/hr  Labs reviewed: CBG 206, BUN 37, A1C 6.6  Diet Order:   Diet Order             DIET DYS 3 Room service appropriate? Yes with Assist; Fluid consistency: Nectar Thick  Diet effective now                  EDUCATION NEEDS:  Not appropriate for education at this time  Skin:  Skin Assessment: Reviewed RN Assessment  Last BM:  2/18 loose stool in rectal tube observed during f/u, but nurse reported diarrhea has improved  Height:  Ht Readings from Last 1 Encounters:  01/22/24 5\' 7"  (1.702 m)    Weight:  Wt Readings from Last 1 Encounters:  01/27/24 84.1 kg   Ideal Body Weight:  67.3 kg  BMI:  Body mass index is 29.04 kg/m.  Estimated Nutritional Needs:   Kcal:  2000-2200  Protein:  110-125 grams  Fluid:  > 2L /day  Louis Meckel Dietetic Intern

## 2024-01-27 NOTE — Hospital Course (Signed)
78yo with h/o HTN and DM who presented on 2/13 with L-sided weakness.  He was found to have an intraparenchymal hemorrhage and was ultimately intubated for airway protection from 2/14-15.  He has weaned off Cleviprex and subsequently transferred to [facility] service 01/28/2024 and was planned for CIR admission however CIR does not feel that the pain will be able to reach a level to go home alone given that his significant other works.  They are now recommending SNF now.  Neurology signed off the case  Assessment and Plan:  Acute right thalamic intraparenchymal hemorrhage -CT Head demonstrated acute APH of R thalamocapsular junction with mild surrounding edema, followed by neurology -CTA Head/Neck negative for LVO or vascular malformation but demonstrating intracranial atherosclerotic disease with multifocal stenoses  -Repeat CT 2/15-stable hematoma on the right thalamic,  intraventricular with subarachnoid extension -Goal systolic blood pressure less than 160 Weaned off Cleviprex On amlodipine, doxazosin, hydralazine, metoprolol, losartan; BP is finally approaching goal -Unable to obtain MRI because of a spinal stimulator -Continue neuroprotective measures- normothermia, euglycemia, HOB greater than 30, head in neutral alignment, normocapnia, normoxia -Awaiting CIR and neurology was following but CIR will not pursue admission for this patient given that they do not feel he can reach a level to go home alone and recommending SNF now   Dysphagia -Continue tube feeds via Cortrack -SLP is following   Acute hypoxemic respiratory failure -Extubated -On Unasyn through 2/18 SpO2: 98 % O2 Flow Rate (L/min): 2 L/min FiO2 (%): 40 % -Continuous pulse oximetry and maintain O2 saturation greater 90% -Repeat chest x-ray in the a.m.   Acute kidney injury on Stage 3a CKD Metabolic Acidosis -BUN/Cr Trend: Recent Labs  Lab 01/22/24 0832 01/23/24 0539 01/24/24 0523 01/25/24 0417 01/26/24 0438  01/27/24 0558 01/28/24 0532  BUN 15 19 32* 27* 31* 37* 40*  CREATININE 1.12 1.53* 1.56* 1.31* 1.02 1.03 1.13  -Has a Metabolic Acidosis with a CO2 of 20, AG of 11, Chloride Level of 111 -Avoid Nephrotoxic Medications, Contrast Dyes, Hypotension and Dehydration to Ensure Adequate Renal Perfusion and will need to Renally Adjust Meds -Continue to Monitor and Trend Renal Function carefully and repeat CMP in the AM  -Appears to be stable and back to relative baseline   Hypertension -On amlodipine, doxazosin, hydralazine, metoprolol, losartan; BP is finally approaching goal -No longer needing Clexiprex -Clonidine would be the next medication, if needed -Monitor blood pressures per protocol and as blood pressure reading was 159/83   Hyperlipidemia -Continued Atorvastatin this is now been discontinued by neurology given that his LDL is extremely low with an ICH   Type 2 Diabetes Mellitus  -A1c 6.6, good control -Home metformin on hold -Goal sugar less than 180 -Continue Semglee, increase dose to 25 units BID as not yet at goal -CBG Trend:  Recent Labs  Lab 01/28/24 0008 01/28/24 0142 01/28/24 0331 01/28/24 0516 01/28/24 0755 01/28/24 1230 01/28/24 1630  GLUCAP 210* 238* 238* 264* 300* 136* 237*   GERD -Continue PPI   History of anxiety depression Chronic back pain -Spinal stimulator in place -On oxy as needed for pain -home Prozac resumed   Moderate Malnutrition in the Context of Chronic Illness Nutrition Status: Nutrition Problem: Moderate Malnutrition Etiology: chronic illness Signs/Symptoms: moderate fat depletion, moderate muscle depletion Interventions: Tube feeding  Overweight -Complicates overall prognosis and care -Estimated body mass index is 29.04 kg/m as calculated from the following:   Height as of this encounter: 5\' 7"  (1.702 m).   Weight as of this encounter:  84.1 kg.  -Weight Loss and Dietary Counseling given

## 2024-01-27 NOTE — Evaluation (Signed)
Modified Barium Swallow Study  Patient Details  Name: Herbert Chapman MRN: [id number] Date of Birth: 02-Jul-1946  Today's Date: 01/27/2024  Modified Barium Swallow completed.  Full report located under Chart Review in the Imaging Section.  History of Present Illness 78 y.o. male presents to [facility] hospital on 01/22/2024 with L weakness. 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 Pt intubated on 2/14; extubated 2/15. PMH includes HTN, DMII, pancreatitis, hepatitis C, GERD, migraines, anxiety, depression, cervical spine surgery.   Clinical Impression Pt has an oropharyngeal dysphagia with slow posterior transit and mastication. Mistiming of swallow initiation, with boluses sitting briefly in the pharynx, results in aspiration of thin liquids before the swallow. This is mostly silent (PAS 8), but some coughing was noted during the study in the absence of other aspiration and could possibly be indicative of a delayed response. Aspiration is not prevented with a chin tuck, but no aspiration is observed with any other consistency tested (PAS 2 with nectar thick liquids). Recommend Dys 3 (mechanical soft) diet and nectar/mildly thick liquids with ongoing SLP follow up.   DIGEST Swallow Severity Rating*  Safety: 2  Efficiency: 0  Overall Pharyngeal Swallow Severity: 2 1: mild; 2: moderate; 3: severe; 4: profound  *The Dynamic Imaging Grade of Swallowing Toxicity is standardized for the head and neck cancer population, however, demonstrates promising clinical applications across populations to standardize the clinical rating of pharyngeal swallow safety and severity.   Factors that may increase risk of adverse event in presence of aspiration Rubye Oaks & Clearance Coots 2021): Reduced cognitive function;Limited mobility;Presence of tubes (ETT, trach, NG, etc.)  Swallow Evaluation Recommendations Recommendations: PO diet PO Diet Recommendation: Dysphagia 3 (Mechanical soft);Mildly thick liquids (Level 2, nectar  thick) Liquid Administration via: Cup;Straw Medication Administration: Whole meds with liquid Supervision: Staff to assist with self-feeding;Full supervision/cueing for swallowing strategies Swallowing strategies  : Slow rate;Small bites/sips Postural changes: Position pt fully upright for meals;Stay upright 30-60 min after meals Oral care recommendations: Oral care BID (2x/day) Caregiver Recommendations: Avoid jello, ice cream, thin soups, popsicles;Remove water pitcher      Mahala Menghini., M.A. CCC-SLP Acute Rehabilitation Services Office [phone]  Secure chat preferred  01/27/2024,11:28 AM

## 2024-01-27 NOTE — Progress Notes (Signed)
STROKE TEAM PROGRESS NOTE    SIGNIFICANT HOSPITAL EVENTS 2/13 patient admitted with right-sided IPH with IVH 2/14 patient intubated due to aspiration and inability to protect airway 2/15 patient extubated  INTERIM HISTORY/SUBJECTIVE Patient partner is at the bedside. Pt lying, awake alert, eyes open, neuro stable and unchanged.  However patient still have high BP, needed PRN IV injection.  Increase losartan from 50-100.  Patient now on 4 BP meds maxed out.  May consider clonidine if not in control.  CBC    Component Value Date/Time   WBC 8.0 01/27/2024 0558   RBC 4.51 01/27/2024 0558   HGB 13.7 01/27/2024 0558   HGB 17.2 11/30/2013 0301   HCT 41.6 01/27/2024 0558   HCT 49.6 11/30/2013 0301   PLT 154 01/27/2024 0558   PLT 184 11/30/2013 0301   MCV 92.2 01/27/2024 0558   MCV 92 11/30/2013 0301   MCH 30.4 01/27/2024 0558   MCHC 32.9 01/27/2024 0558   RDW 13.5 01/27/2024 0558   RDW 13.8 11/30/2013 0301   LYMPHSABS 0.7 01/23/2024 0539   LYMPHSABS 2.0 04/17/2012 1253   MONOABS 1.4 (H) 01/23/2024 0539   MONOABS 0.9 04/17/2012 1253   EOSABS 0.0 01/23/2024 0539   EOSABS 0.1 04/17/2012 1253   BASOSABS 0.0 01/23/2024 0539   BASOSABS 0.1 04/17/2012 1253   BASOSABS 1 04/17/2012 1253    BMET    Component Value Date/Time   NA 143 01/27/2024 0558   NA 131 (L) 11/30/2013 0301   K 4.0 01/27/2024 0558   K 3.9 11/30/2013 0301   CL 110 01/27/2024 0558   CL 95 (L) 11/30/2013 0301   CO2 21 (L) 01/27/2024 0558   CO2 29 11/30/2013 0301   GLUCOSE 203 (H) 01/27/2024 0558   GLUCOSE 237 (H) 11/30/2013 0301   BUN 37 (H) 01/27/2024 0558   BUN 22 (H) 11/30/2013 0301   CREATININE 1.03 01/27/2024 0558   CREATININE 0.89 11/30/2013 0301   CALCIUM 9.0 01/27/2024 0558   CALCIUM 9.6 11/30/2013 0301   GFRNONAA >60 01/27/2024 0558   GFRNONAA >60 11/30/2013 0301    IMAGING past 24 hours DG Swallowing Func-Speech Pathology Result Date: 01/27/2024 Table formatting from the original result was not  included. Modified Barium Swallow Study Patient Details Name: Herbert Chapman MRN: [id number] Date of Birth: 1946-07-25 Today's Date: 01/27/2024 HPI/PMH: HPI: 78 y.o. male presents to [facility] on 01/22/2024 with L weakness. 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 Pt intubated on 2/14; extubated 2/15. PMH includes HTN, DMII, pancreatitis, hepatitis C, GERD, migraines, anxiety, depression, cervical spine surgery. Clinical Impression: Pt has an oropharyngeal dysphagia with slow posterior transit and mastication. Mistiming of swallow initiation, with boluses sitting briefly in the pharynx, results in aspiration of thin liquids before the swallow. This is mostly silent (PAS 8), but some coughing was noted during the study in the absence of other aspiration and could possibly be indicative of a delayed response. Aspiration is not prevented with a chin tuck, but no aspiration is observed with any other consistency tested (PAS 2 with nectar thick liquids). Recommend Dys 3 (mechanical soft) diet and nectar/mildly thick liquids with ongoing SLP follow up. DIGEST Swallow Severity Rating*  Safety: 2  Efficiency: 0  Overall Pharyngeal Swallow Severity: 2 1: mild; 2: moderate; 3: severe; 4: profound *The Dynamic Imaging Grade of Swallowing Toxicity is standardized for the head and neck cancer population, however, demonstrates promising clinical applications across populations to standardize the clinical rating of pharyngeal swallow safety and severity.  Factors that may increase risk of adverse event in presence of aspiration Herbert Chapman & Herbert Chapman 2021): Factors that may increase risk of adverse event in presence of aspiration Herbert Chapman & Herbert Chapman 2021): Reduced cognitive function; Limited mobility; Presence of tubes (ETT, trach, NG, etc.) Recommendations/Plan: Swallowing Evaluation Recommendations Swallowing Evaluation Recommendations Recommendations: PO diet PO Diet Recommendation: Dysphagia 3 (Mechanical soft); Mildly thick liquids (Level  2, nectar thick) Liquid Administration via: Cup; Straw Medication Administration: Whole meds with liquid Supervision: Staff to assist with self-feeding; Full supervision/cueing for swallowing strategies Swallowing strategies  : Slow rate; Small bites/sips Postural changes: Position pt fully upright for meals; Stay upright 30-60 min after meals Oral care recommendations: Oral care BID (2x/day) Caregiver Recommendations: Avoid jello, ice cream, thin soups, popsicles; Remove water pitcher Treatment Plan Treatment Plan Treatment recommendations: Therapy as outlined in treatment plan below Follow-up recommendations: Acute inpatient rehab (3 hours/day) Functional status assessment: Patient has had a recent decline in their functional status and demonstrates the ability to make significant improvements in function in a reasonable and predictable amount of time. Treatment frequency: Min 2x/week Treatment duration: 2 weeks Interventions: Aspiration precaution training; Compensatory techniques; Patient/family education; Trials of upgraded texture/liquids; Diet toleration management by SLP Recommendations Recommendations for follow up therapy are one component of a multi-disciplinary discharge planning process, led by the attending physician.  Recommendations may be updated based on patient status, additional functional criteria and insurance authorization. Assessment: Orofacial Exam: Orofacial Exam Oral Cavity: Oral Hygiene: WFL Oral Cavity - Dentition: Adequate natural dentition; Missing dentition Orofacial Anatomy: WFL Anatomy: Anatomy: WFL Boluses Administered: Boluses Administered Boluses Administered: Thin liquids (Level 0); Mildly thick liquids (Level 2, nectar thick); Moderately thick liquids (Level 3, honey thick); Puree; Solid  Oral Impairment Domain: Oral Impairment Domain Lip Closure: Interlabial escape, no progression to anterior lip Tongue control during bolus hold: Posterior escape of less than half of bolus  Bolus preparation/mastication: Slow prolonged chewing/mashing with complete recollection Bolus transport/lingual motion: Slow tongue motion Oral residue: Trace residue lining oral structures Location of oral residue : Tongue Initiation of pharyngeal swallow : Pyriform sinuses  Pharyngeal Impairment Domain: Pharyngeal Impairment Domain Soft palate elevation: No bolus between soft palate (SP)/pharyngeal wall (PW) Laryngeal elevation: Partial superior movement of thyroid cartilage/partial approximation of arytenoids to epiglottic petiole Anterior hyoid excursion: Partial anterior movement Epiglottic movement: Complete inversion Laryngeal vestibule closure: Incomplete, narrow column air/contrast in laryngeal vestibule Pharyngeal stripping wave : Present - complete Pharyngeal contraction (A/P view only): N/A Pharyngoesophageal segment opening: Complete distension and complete duration, no obstruction of flow Tongue base retraction: Trace column of contrast or air between tongue base and PPW Pharyngeal residue: Complete pharyngeal Herbert Location of pharyngeal residue: N/A  Esophageal Impairment Domain: Esophageal Impairment Domain Esophageal Herbert upright position: Esophageal retention with retrograde flow below pharyngoesophageal segment (PES) Pill: Pill Consistency administered: Mildly thick liquids (Level 2, nectar thick) Mildly thick liquids (Level 2, nectar thick): WFL Penetration/Aspiration Scale Score: Penetration/Aspiration Scale Score 1.  Material does not enter airway: Moderately thick liquids (Level 3, honey thick); Puree; Solid; Pill 2.  Material enters airway, remains ABOVE vocal cords then ejected out: Mildly thick liquids (Level 2, nectar thick) 8.  Material enters airway, passes BELOW cords without attempt by patient to eject out (silent aspiration) : Thin liquids (Level 0) Compensatory Strategies: Compensatory Strategies Compensatory strategies: Yes Straw: Effective Effective Straw: Mildly thick  liquid (Level 2, nectar thick) Chin tuck: Ineffective Ineffective Chin Tuck: Thin liquid (Level 0)   General Information: Caregiver present: No  Diet  Prior to this Study: NPO; Cortrak/Small bore NG tube   Temperature : Normal   Respiratory Status: WFL   Supplemental O2: None (Room air)   History of Recent Intubation: Yes  Behavior/Cognition: Alert; Cooperative; Pleasant mood; Requires cueing Self-Feeding Abilities: Able to self-feed Baseline vocal quality/speech: Hypophonia/low volume Volitional Cough: Able to elicit Volitional Swallow: Able to elicit Exam Limitations: No limitations Goal Planning: Prognosis for improved oropharyngeal function: Good Barriers to Reach Goals: Cognitive deficits No data recorded Patient/Family Stated Goal: wants water Consulted and agree with results and recommendations: Patient Pain: Pain Assessment Pain Assessment: Faces Faces Pain Scale: 0 End of Session: Start Time:SLP Start Time (ACUTE ONLY): 0955 Stop Time: SLP Stop Time (ACUTE ONLY): 1012 Time Calculation:SLP Time Calculation (min) (ACUTE ONLY): 17 min Charges: SLP Evaluations $ SLP Speech Visit: 1 Visit SLP Evaluations $BSS Swallow: 1 Procedure $MBS Swallow: 1 Procedure SLP visit diagnosis: SLP Visit Diagnosis: Dysphagia, oropharyngeal phase (R13.12) Past Medical History: Past Medical History: Diagnosis Date  Anxiety   Arthritis   Cancer (HCC)   squamous cell CA removed from skin  Depression   Diabetes mellitus without complication (HCC)   GERD (gastroesophageal reflux disease)   rare-no meds  Headache   h/o migraines  Hepatitis   Hep C-pt had injections and pill and pt states he does not have hep c anymore  History of kidney stones   h/o age 78  Hypertension   Mental disorder   Pancreatitis 2012  Personal history of tobacco use, presenting hazards to health 11/07/2015 Past Surgical History: Past Surgical History: Procedure Laterality Date  CERVICAL SPINE SURGERY    CIRCUMCISION    COLONOSCOPY    EYE SURGERY    bilateral  cataract removal; implants  LUMBAR LAMINECTOMY/DECOMPRESSION MICRODISCECTOMY  10/07/2012  Procedure: LUMBAR LAMINECTOMY/DECOMPRESSION MICRODISCECTOMY 2 LEVELS;  Surgeon: Tia Alert, MD;  Location: [facility];  Service: Neurosurgery;  Laterality: Left;  Left Lumbar two-three,lumbar four-five hemilaminectomy  PILONIDAL CYST EXCISION    SPINAL CORD STIMULATOR IMPLANT  2018  [employer]  TONSILLECTOMY    TOTAL SHOULDER ARTHROPLASTY Right 10/13/2019  Procedure: TOTAL SHOULDER ARTHROPLASTY;  Surgeon: Lyndle Herrlich, MD;  Location: [facility];  Service: Orthopedics;  Laterality: Right; Herbert Chapman., M.A. CCC-SLP Acute Rehabilitation Services Office [phone] Secure chat preferred 01/27/2024, 11:44 AM    Vitals:   01/27/24 0750 01/27/24 1239 01/27/24 1239 01/27/24 1507  BP: (!) 152/80 (!) 155/78 (!) 158/78 128/88  Pulse:  67 67 71  Resp:  18 17 17   Temp:  97.8 F (36.6 C) 97.8 F (36.6 C) 99.6 F (37.6 C)  TempSrc:  Oral Oral Oral  SpO2:    97%  Weight:      Height:         PHYSICAL EXAM General: Well-nourished, well-developed elderly patient in no acute distress Psych:  Mood and affect appropriate for situation CV: Regular rate and rhythm on monitor Respiratory: Respirations regular and unlabored on supplemental O2   NEURO:  awake, alert, eyes open, orientated to place, time but stated age 31 instead of 4. No aphasia but paucity of language, following all simple commands. Able to name and repeat. No gaze palsy, tracking bilaterally, visual field full, PERRL. Mild left facial droop and tongue protrusion to the left. RUE and RLE 5/5, LUE 3-/5 with significant ataxia seems out of proportion to the weakness. LLE 3-/5 proximal and knee flexion but distal ankle DF 4/5. Sensation symmetrical bilaterally but seems to have left simultagnosia, R FTN intact, gait not  tested.     ASSESSMENT/PLAN  Mr. JAYMZ TRAYWICK is a 78 y.o. male with history of diabetes, hypertension, migraines and previous  smoking admitted for acute onset left-sided weakness.  He was found to have an IPH at the right thalamocapsular junction with IVH on CT.  Overnight, he had an aspiration event and became febrile and was intubated due to concern for inability to protect airway as well as aspiration pneumonia.   NIH on Admission 5 ICH score 1  ICH:  right Thalamocapsular junction IPH with IVH, etiology: Likely hypertensive Code Stroke CT head 7 mm acute parenchymal hemorrhage centered at right thalamocapsular junction with mild surrounding edema, moderate volume IVH and no evidence of hydrocephalus CTA head & neck no LVO, severe, greater than 90% stenoses in bilateral internal carotid artery origins, no evidence of aneurysm or AVM in the region of the IPH, severe stenosis within right PCA P2 and P3 segments, severe stenosis within left PCA segment, moderate stenosis within right ICA cavernous segment Repeat head CT 2/14 stable IPH with no increase in hydrocephalus MRI unable to perform due to spinal stimulator 2D Echo EF 70 to 75% LDL 10 HgbA1c 6.6 VTE prophylaxis -Lovenox aspirin 81 mg daily prior to admission, now on No antithrombotic secondary to IPH Therapy recommendations:  CIR Disposition: Pending  Hypertension Home meds: Doxazosin 4 mg daily, lisinopril 40 mg daily, metoprolol 100 mg daily Continues to require Cleviprex, wean off as able Now on amlodipine 10, doxazosin 4, hydralazine 100 every 8h On metoprolol 50->100 bid on losartan 100 every day May consider clonidine if BP still not in control Blood Pressure Goal: SBP less than 160  Long-term BP goal normotensive  Hyperlipidemia Home meds: Atorvastatin 10 mg daily LDL 10, goal < 70 DC statin due to extremely low LDL with ICH  Diabetes type II Controlled Home meds: Metformin 1000 mg twice daily HgbA1c 6.6, goal < 7.0 CBGs SSI Hyperglycemia persists Consult DM coordinator Recommend close follow-up with PCP for better DM  control  Respiratory failure, resolved Aspiration pneumonia Patient was intubated 2/14 after aspiration event Extubated 2/15 Tolerating well so far Antibiotics per CCM On Unasyn Tmax 100.3-> afebrile Leukocytosis WBC 6.4--19.9--10.5--7.2  Dysphagia Patient has post-stroke dysphagia SLP consulted Now on dysphagia 3 and nectar thick liquid On tube feeding at 55 -> 80 cc/h  Other Stroke Risk Factors Advanced age Migraines  Other Active Problems AKI, creatinine 1.12--1.53--1.56--1.31--1.02--1.03 Tachycardia, HR 110s->normalized, back on metoprolol  Hospital day # 5  Neurology will sign off. Please call with questions. Pt will follow up with stroke clinic NP at [facility] in about 4 weeks. Thanks for the consult.   Marvel Plan, MD PhD Stroke Neurology 01/27/2024 4:08 PM   To contact Stroke Continuity provider, please refer to [email or web address]. After hours, contact General Neurology

## 2024-01-27 NOTE — Progress Notes (Addendum)
Progress Note   Patient: Herbert Chapman ZOX:[id number] DOB: 1946-02-16 DOA: 01/22/2024     5 DOS: the patient was seen and examined on 01/27/2024   Brief hospital course: 78yo with h/o HTN and DM who presented on 2/13 with L-sided weakness.  He was found to have an intraparenchymal hemorrhage and was ultimately intubated for airway protection from 2/14-15.  He has weaned off Cleviprex and is planned for CIR admission.  Assessment and Plan:  Acute right thalamic intraparenchymal hemorrhage CT Head demonstrated acute APH of R thalamocapsular junction with mild surrounding edema, followed by neurology CTA Head/Neck negative for LVO or vascular malformation but demonstrating intracranial atherosclerotic disease with multifocal stenoses  Repeat CT 2/15-stable hematoma on the right thalamic,  intraventricular with subarachnoid extension Goal systolic blood pressure less than 160 Weaned off Cleviprex On amlodipine, doxazosin, hydralazine, metoprolol, losartan; BP is finally approaching goal Unable to obtain MRI because of a spinal stimulator Continue neuroprotective measures- normothermia, euglycemia, HOB greater than 30, head in neutral alignment, normocapnia, normoxia Awaiting CIR   Dysphagia Continue tube feeds SLP is following   Acute hypoxemic respiratory failure Extubated On Unasyn through 2/18   Acute kidney injury on Stage 3a CKD Maintain renal perfusion Avoid nephrotoxic medications Appears to be stable and back to relative baseline   Hypertension On amlodipine, doxazosin, hydralazine, metoprolol, losartan; BP is finally approaching goal No longer needing Clexiprex Clonidine would be the next medication, if needed  Hyperlipidemia Continue atorvastatin  Type 2 diabetes A1c 6.6, good control Home metformin on hold Goal sugar less than 180 Continue Semglee, increase dose to 25 units BID as not yet at goal   GERD Continue PPI   History of anxiety depression Chronic back  pain Spinal stimulator in place On oxy as needed for pain home Prozac resumed  Nutrition Status: Nutrition Problem: Moderate Malnutrition Etiology: chronic illness Signs/Symptoms: moderate fat depletion, moderate muscle depletion Interventions: Tube feeding      Consultants: PCCM Neurology PT OT SLP Nutrition RT CIR  Procedures: Intubation 2/14-15 Echocardiogram 2/14  Antibiotics: None  30 Day Unplanned Readmission Risk Score    Flowsheet Row Admission (Current) from 01/22/2024 in [facility]  30 Day Unplanned Readmission Risk Score (%) 19.41 Filed at 01/27/2024 0401       This score is the patient's risk of an unplanned readmission within 30 days of being discharged (0 -100%). The score is based on dignosis, age, lab data, medications, orders, and past utilization.   Low:  0-14.9   Medium: 15-21.9   High: 22-29.9   Extreme: 30 and above           Subjective: Appears to be able to answer yes/no questions, although it is not clear whether these answers are accurate.  Not oriented even to person today.   Objective: Vitals:   01/27/24 0604 01/27/24 0727  BP: (!) 199/84 (!) 178/73  Pulse:    Resp:  16  Temp:  98.8 F (37.1 C)  SpO2:  95%    Intake/Output Summary (Last 24 hours) at 01/27/2024 0747 Last data filed at 01/27/2024 0612 Gross per 24 hour  Intake 1762.73 ml  Output 1250 ml  Net 512.73 ml   Filed Weights   01/25/24 0500 01/26/24 0500 01/27/24 0500  Weight: 83.1 kg 83 kg 84.1 kg    Exam:  General:  Appears calm and comfortable and is in NAD; NG tube in place Eyes:   EOMI, normal lids, iris ENT:  grossly normal hearing,  lips & tongue, mmm Neck:  no LAD, masses or thyromegaly Cardiovascular:  RRR, no m/r/g. No LE edema.  Respiratory:   CTA bilaterally with no wheezes/rales/rhonchi.  Normal respiratory effort. Abdomen:  soft, NT, ND Skin:  no rash or induration seen on limited exam Musculoskeletal:  grossly normal  tone BUE/BLE, good ROM, no bony abnormality Psychiatric: blunted mood and affect, speech sparse and not oriented Neurologic:  eyes open, difficult exam given his lack of speech, appears to have persistent left-sided weakness  Data Reviewed: I have reviewed the patient's lab results since admission.  Pertinent labs for today include:   Glucose 203 BUN 37/Creatinine 1.03/GFR >60 Normal CBC     Family Communication: None present  Disposition: Status is: Inpatient Remains inpatient appropriate because: ongoing management     Time spent: 50 minutes  Unresulted Labs (From admission, onward)     Start     Ordered   01/25/24 0500  CBC  Daily,   R     Question:  Specimen collection method  Answer:  Lab=Lab collect   01/24/24 1310   01/25/24 0500  Basic metabolic panel  Daily,   R     Question:  Specimen collection method  Answer:  Lab=Lab collect   01/24/24 1310   01/24/24 1310  Urinalysis, Routine w reflex microscopic -Urine, Unspecified Source  Once,   R       Question:  Specimen Source  Answer:  Urine, Unspecified Source   01/24/24 1309             Author: Jonah Blue, MD 01/27/2024 7:47 AM  For on call review [email or web address].

## 2024-01-27 NOTE — Progress Notes (Signed)
Physical Therapy Treatment Patient Details Name: Herbert Chapman MRN: [id number] DOB: [birth year] Today's Date: 01/27/2024   History of Present Illness 78 y.o. male presents to [facility] hospital on 01/22/2024 with L weakness. 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 Pt intubated on 2/14-2/15. PMH includes HTN, DMII, pancreatitis, hepatitis C, GERD, migraines, anxiety, depression.    PT Comments  Pt resting in bed on arrival and agreeable to session with steady progress towards acute goals. Pt requiring mod A for bed mobility and mod A to boost to stand x3 during session with HHA and RW for support. Pt with improved L lateral lean this session, able to correct with min verbal and tactile cues and able to perform propped sitting on R elbow with return to midline with good carryover for midline sitting as well as reaching outside BOS with RUE. Pt continues to fatigue quickly and requiring mod A to return to supine at end of session. Pt partner [name] present and supportive throughout session. Current plan remains appropriate to address deficits and maximize functional independence and decrease caregiver burden. Pt continues to benefit from skilled PT services to progress toward functional mobility goals.     If plan is discharge home, recommend the following:     Can travel by private vehicle        Equipment Recommendations  Other (comment) (TBD)    Recommendations for Other Services       Precautions / Restrictions Precautions Precautions: Fall;Other (comment) Precaution/Restrictions Comments: L hemi, coretrak, flexiseal Restrictions Weight Bearing Restrictions Per Provider Order: No     Mobility  Bed Mobility Overal bed mobility: Needs Assistance Bed Mobility: Supine to Sit     Supine to sit: Mod assist Sit to supine: Mod assist   General bed mobility comments: assist with LLE and trunk, increased time to scoot to EOB, cues to attend to LUE as pt leaving behind on entry to bed     Transfers Overall transfer level: Needs assistance Equipment used: Rolling walker (2 wheels), 1 person hand held assist Transfers: Sit to/from Stand, Bed to chair/wheelchair/BSC Sit to Stand: Mod assist, Max assist           General transfer comment: max A fading to mod A to power up from bed, mod A to maintain standing balance as pt with L lateral lean in standing and needing cues to elevate trunk    Ambulation/Gait               General Gait Details: unable to safely progress   Stairs             Wheelchair Mobility     Tilt Bed    Modified Rankin (Stroke Patients Only) Modified Rankin (Stroke Patients Only) Pre-Morbid Rankin Score: No symptoms Modified Rankin: Severe disability     Balance Overall balance assessment: Needs assistance Sitting-balance support: Single extremity supported, Feet supported Sitting balance-Leahy Scale: Poor Sitting balance - Comments: L lean, able to correct with min verbal/tactile cues Postural control: Left lateral lean Standing balance support: Bilateral upper extremity supported, During functional activity Standing balance-Leahy Scale: Poor Standing balance comment: requires mod A to maintain standing balance                            Communication Communication Communication: Impaired Factors Affecting Communication: Difficulty expressing self  Cognition Arousal: Alert Behavior During Therapy: WFL for tasks assessed/performed   PT - Cognitive impairments: Awareness, Problem solving, Safety/Judgement,  Sequencing, Memory                         Following commands: Intact      Cueing Cueing Techniques: Verbal cues, Tactile cues  Exercises Other Exercises Other Exercises: propped sitting on R with return to midline x5    General Comments General comments (skin integrity, edema, etc.): VSS on RA, partner [name] present and supportive      Pertinent Vitals/Pain Pain Assessment Pain  Assessment: Faces Faces Pain Scale: No hurt Pain Intervention(s): Monitored during session, Limited activity within patient's tolerance    Home Living                          Prior Function            PT Goals (current goals can now be found in the care plan section) Acute Rehab PT Goals Patient Stated Goal: to walk PT Goal Formulation: With patient Time For Goal Achievement: 02/08/24 Progress towards PT goals: Progressing toward goals    Frequency    Min 1X/week      PT Plan      Co-evaluation              AM-PAC PT "6 Clicks" Mobility   Outcome Measure  Help needed turning from your back to your side while in a flat bed without using bedrails?: A Lot Help needed moving from lying on your back to sitting on the side of a flat bed without using bedrails?: A Lot Help needed moving to and from a bed to a chair (including a wheelchair)?: Total Help needed standing up from a chair using your arms (e.g., wheelchair or bedside chair)?: Total Help needed to walk in hospital room?: Total Help needed climbing 3-5 steps with a railing? : Total 6 Click Score: 8    End of Session Equipment Utilized During Treatment: Gait belt Activity Tolerance: Patient tolerated treatment well Patient left: with call bell/phone within reach;in bed;with bed alarm set;with family/visitor present;Other (comment) (with bed in partial chair position) Nurse Communication: Mobility status PT Visit Diagnosis: Other abnormalities of gait and mobility (R26.89);Other symptoms and signs involving the nervous system (R29.898)     Time: 1610-9604 PT Time Calculation (min) (ACUTE ONLY): 30 min  Charges:    $Therapeutic Activity: 23-37 mins PT General Charges $$ ACUTE PT VISIT: 1 Visit                     Teryn Boerema R. PTA Acute Rehabilitation Services Office: [phone]   Catalina Antigua 01/27/2024, 12:53 PM

## 2024-01-27 NOTE — Progress Notes (Signed)
Inpatient Rehab Admissions Coordinator:  Attempted to contact pt's significant other Windy Fast to inquire if he has been able to arrange appropriate support for pt after discharege. No one answered the phone and unable to leave a message. Will continue to follow.   Wolfgang Phoenix, MS, CCC-SLP Admissions Coordinator [phone]

## 2024-01-28 DIAGNOSIS — I610 Nontraumatic intracerebral hemorrhage in hemisphere, subcortical: Secondary | ICD-10-CM | POA: Diagnosis not present

## 2024-01-28 DIAGNOSIS — I6381 Other cerebral infarction due to occlusion or stenosis of small artery: Secondary | ICD-10-CM | POA: Diagnosis not present

## 2024-01-28 DIAGNOSIS — I619 Nontraumatic intracerebral hemorrhage, unspecified: Secondary | ICD-10-CM | POA: Diagnosis not present

## 2024-01-28 DIAGNOSIS — J9601 Acute respiratory failure with hypoxia: Secondary | ICD-10-CM | POA: Diagnosis not present

## 2024-01-28 LAB — GLUCOSE, CAPILLARY
Glucose-Capillary: 136 mg/dL — ABNORMAL HIGH (ref 70–99)
Glucose-Capillary: 210 mg/dL — ABNORMAL HIGH (ref 70–99)
Glucose-Capillary: 237 mg/dL — ABNORMAL HIGH (ref 70–99)
Glucose-Capillary: 238 mg/dL — ABNORMAL HIGH (ref 70–99)
Glucose-Capillary: 238 mg/dL — ABNORMAL HIGH (ref 70–99)
Glucose-Capillary: 264 mg/dL — ABNORMAL HIGH (ref 70–99)
Glucose-Capillary: 298 mg/dL — ABNORMAL HIGH (ref 70–99)
Glucose-Capillary: 300 mg/dL — ABNORMAL HIGH (ref 70–99)

## 2024-01-28 LAB — CULTURE, BLOOD (ROUTINE X 2)
Culture: NO GROWTH
Culture: NO GROWTH
Special Requests: ADEQUATE

## 2024-01-28 LAB — BASIC METABOLIC PANEL
Anion gap: 11 (ref 5–15)
BUN: 40 mg/dL — ABNORMAL HIGH (ref 8–23)
CO2: 20 mmol/L — ABNORMAL LOW (ref 22–32)
Calcium: 8.7 mg/dL — ABNORMAL LOW (ref 8.9–10.3)
Chloride: 110 mmol/L (ref 98–111)
Creatinine, Ser: 1.13 mg/dL (ref 0.61–1.24)
GFR, Estimated: >60 mL/min (ref >60)
Glucose, Bld: 270 mg/dL — ABNORMAL HIGH (ref 70–99)
Potassium: 4.7 mmol/L (ref 3.5–5.1)
Sodium: 141 mmol/L (ref 135–145)

## 2024-01-28 LAB — CBC
HCT: 41.9 % (ref 39.0–52.0)
Hemoglobin: 13.3 g/dL (ref 13.0–17.0)
MCH: 29.7 pg (ref 26.0–34.0)
MCHC: 31.7 g/dL (ref 30.0–36.0)
MCV: 93.5 fL (ref 80.0–100.0)
Platelets: 179 10*3/uL (ref 150–400)
RBC: 4.48 MIL/uL (ref 4.22–5.81)
RDW: 13.5 % (ref 11.5–15.5)
WBC: 9.9 10*3/uL (ref 4.0–10.5)
nRBC: 0 % (ref 0.0–0.2)

## 2024-01-28 MED ORDER — GERHARDT'S BUTT CREAM
TOPICAL_CREAM | Freq: Three times a day (TID) | CUTANEOUS | Status: DC
  Administered 2024-01-28 (×2): 1 via TOPICAL
  Filled 2024-01-28: qty 60

## 2024-01-28 NOTE — [place] FL2 (Signed)
[place] MEDICAID FL2 LEVEL OF CARE FORM     IDENTIFICATION  Patient Name: Herbert Chapman Birthdate: [birth year] Sex: male Admission Date (Current Location): 01/22/2024  [facility] and [place] Number:  Chiropodist and Address:  The . [facility], [address] [place], [place] [address]      Provider Number: [id number]  Attending Physician Name and Address:  Merlene Laughter, DO  Relative Name and Phone Number:       Current Level of Care: Hospital Recommended Level of Care: Skilled Nursing Facility Prior Approval Number:    Date Approved/Denied:   PASRR Number: [id number]  Discharge Plan: SNF    Current Diagnoses: Patient Active Problem List   Diagnosis Date Noted   Right thalamic stroke (HCC) 01/23/2024   Acute hypoxemic respiratory failure (HCC) 01/23/2024   Stroke, hemorrhagic (HCC) 01/23/2024   ICH (intracerebral hemorrhage) (HCC) 01/22/2024   Status post total shoulder arthroplasty, right 10/13/2019   Depression 01/13/2017   Hepatitis C 01/13/2017   Hypertension 01/13/2017   Squamous cell carcinoma 01/13/2017   Type 2 diabetes mellitus (HCC) 01/13/2017   Atherosclerosis 02/23/2016   CKD (chronic kidney disease) stage 3, GFR 30-59 ml/min (HCC) 02/23/2016   Personal history of tobacco use, presenting hazards to health 11/07/2015   Benign non-nodular prostatic hyperplasia with lower urinary tract symptoms 03/16/2015   Chronic back pain 03/16/2015    Orientation RESPIRATION BLADDER Height & Weight     Self, Time  Normal Incontinent Weight: 185 lb 6.5 oz (84.1 kg) Height:  5\' 7"  (170.2 cm)  BEHAVIORAL SYMPTOMS/MOOD NEUROLOGICAL BOWEL NUTRITION STATUS      Incontinent Diet (see DC summary)  AMBULATORY STATUS COMMUNICATION OF NEEDS Skin   Extensive Assist Verbally                         Personal Care Assistance Level of Assistance  Bathing, Feeding, Dressing Bathing Assistance: Maximum assistance Feeding assistance:  Maximum assistance Dressing Assistance: Maximum assistance     Functional Limitations Info  Speech     Speech Info: Impaired (dysarthria)    SPECIAL CARE FACTORS FREQUENCY  PT (By licensed PT), OT (By licensed OT), Speech therapy     PT Frequency: 5x/wk OT Frequency: 5x/wk     Speech Therapy Frequency: 5x/wk      Contractures Contractures Info: Not present    Additional Factors Info  Code Status, Allergies, Psychotropic, Insulin Sliding Scale Code Status Info: Full Allergies Info: NKA Psychotropic Info: Prozac 60mg  daily Insulin Sliding Scale Info: see DC summary       Current Medications (01/28/2024):  This is the current hospital active medication list Current Facility-Administered Medications  Medication Dose Route Frequency Provider Last Rate Last Admin   acetaminophen (TYLENOL) tablet 650 mg  650 mg Oral Q4H PRN Erick Blinks, MD       Or   acetaminophen (TYLENOL) 160 MG/5ML solution 650 mg  650 mg Per Tube Q4H PRN Erick Blinks, MD   650 mg at 01/26/24 0343   Or   acetaminophen (TYLENOL) suppository 650 mg  650 mg Rectal Q4H PRN Erick Blinks, MD   650 mg at 01/23/24 0355   amLODipine (NORVASC) tablet 10 mg  10 mg Per Tube Daily Micki Riley, MD   10 mg at 01/28/24 3086   Chlorhexidine Gluconate Cloth 2 % PADS 6 each  6 each Topical Daily Erick Blinks, MD   6 each at 01/28/24 1000   doxazosin (  CARDURA) tablet 4 mg  4 mg Per Tube Daily Micki Riley, MD   4 mg at 01/28/24 1057   enoxaparin (LOVENOX) injection 40 mg  40 mg Subcutaneous Q24H Marvel Plan, MD   40 mg at 01/28/24 0835   famotidine (PEPCID) tablet 20 mg  20 mg Per Tube BID Olalere, Adewale A, MD   20 mg at 01/28/24 0834   feeding supplement (OSMOLITE 1.5 CAL) liquid 1,000 mL  1,000 mL Per Tube Q24H Jonah Blue, MD   Infusion Verify at 01/28/24 1624   feeding supplement (PROSource TF20) liquid 60 mL  60 mL Per Tube Daily Jonah Blue, MD   60 mL at 01/28/24 0834    FLUoxetine (PROZAC) capsule 60 mg  60 mg Per Tube Wille Celeste, MD   60 mg at 01/28/24 1610   Gerhardt's butt cream   Topical TID Merlene Laughter, DO   1 Application at 01/28/24 1557   hydrALAZINE (APRESOLINE) injection 5-20 mg  5-20 mg Intravenous Q4H PRN Marvel Plan, MD   20 mg at 01/26/24 2347   hydrALAZINE (APRESOLINE) tablet 100 mg  100 mg Per Tube Q8H de Saintclair Halsted, Cortney E, NP   100 mg at 01/28/24 1317   insulin aspart (novoLOG) injection 0-15 Units  0-15 Units Subcutaneous Q4H Harris, Alphonzo Lemmings D, NP   5 Units at 01/28/24 1711   insulin aspart (novoLOG) injection 12 Units  12 Units Subcutaneous Q4H Olalere, Adewale A, MD   12 Units at 01/28/24 0832   insulin glargine-yfgn (SEMGLEE) injection 25 Units  25 Units Subcutaneous BID Jonah Blue, MD   25 Units at 01/28/24 1058   ipratropium-albuterol (DUONEB) 0.5-2.5 (3) MG/3ML nebulizer solution 3 mL  3 mL Nebulization Q4H PRN Janeann Forehand D, NP       labetalol (NORMODYNE) injection 10-20 mg  10-20 mg Intravenous Q2H PRN Marvel Plan, MD   10 mg at 01/27/24 0730   losartan (COZAAR) tablet 100 mg  100 mg Per Tube Daily Gevena Mart A, NP   100 mg at 01/28/24 9604   metoprolol tartrate (LOPRESSOR) tablet 100 mg  100 mg Per Tube BID Marvel Plan, MD   100 mg at 01/28/24 5409   multivitamin with minerals tablet 1 tablet  1 tablet Per Tube Daily Luciano Cutter, MD   1 tablet at 01/28/24 0833   ondansetron [facility]) injection 4 mg  4 mg Intravenous Q6H PRN Marvel Plan, MD   4 mg at 01/23/24 8119   Oral care mouth rinse  15 mL Mouth Rinse PRN Erick Blinks, MD       Oral care mouth rinse  15 mL Mouth Rinse Q2H Harris, Whitney D, NP   15 mL at 01/28/24 1716   Oral care mouth rinse  15 mL Mouth Rinse PRN Janeann Forehand D, NP       oxyCODONE (Oxy IR/ROXICODONE) immediate release tablet 5 mg  5 mg Per Tube Q4H PRN Rejeana Brock, MD   5 mg at 01/26/24 1478   thiamine (VITAMIN B1) tablet 100 mg  100 mg Per Tube Daily Luciano Cutter, MD   100 mg at 01/28/24 2956     Discharge Medications: Please see discharge summary for a list of discharge medications.  Relevant Imaging Results:  Relevant Lab Results:   Additional Information SS#: [id number]  Baldemar Lenis, LCSW

## 2024-01-28 NOTE — Progress Notes (Signed)
Inpatient Rehabilitation Admissions Coordinator   I contacted patient's partner, Herbert Chapman , by phone at [phone]. He states that he can not arrange 24/7 assistance for Herbert Chapman when he is working. I explained that I can not pursue CIR admit , for we feel he can not reach level to go home alone when [place] works. I will alert acute team and TOC that he will need SNF at this time.  Ottie Glazier, RN, MSN Rehab Admissions Coordinator [phone] 01/28/2024 10:14 AM

## 2024-01-28 NOTE — Plan of Care (Signed)
  Problem: Clinical Measurements: Goal: Ability to maintain clinical measurements within normal limits will improve Outcome: Progressing Goal: Will remain free from infection Outcome: Progressing Goal: Cardiovascular complication will be avoided Outcome: Progressing   Problem: Activity: Goal: Risk for activity intolerance will decrease Outcome: Progressing   Problem: Nutrition: Goal: Adequate nutrition will be maintained Outcome: Progressing   Problem: Coping: Goal: Level of anxiety will decrease Outcome: Progressing   

## 2024-01-28 NOTE — Care Management Important Message (Signed)
Important Message  Patient Details  Name: Herbert Chapman MRN: [id number] Date of Birth: 05/10/1946   Important Message Given:  Yes - Medicare IM     Dorena Bodo 01/28/2024, 1:49 PM

## 2024-01-28 NOTE — Progress Notes (Signed)
STROKE TEAM PROGRESS NOTE   INTERIM HISTORY/SUBJECTIVE RN and PT are at the bedside. Pt no acute event overnight, pending CIR placement.  CBC    Component Value Date/Time   WBC 9.9 01/28/2024 0532   RBC 4.48 01/28/2024 0532   HGB 13.3 01/28/2024 0532   HGB 17.2 11/30/2013 0301   HCT 41.9 01/28/2024 0532   HCT 49.6 11/30/2013 0301   PLT 179 01/28/2024 0532   PLT 184 11/30/2013 0301   MCV 93.5 01/28/2024 0532   MCV 92 11/30/2013 0301   MCH 29.7 01/28/2024 0532   MCHC 31.7 01/28/2024 0532   RDW 13.5 01/28/2024 0532   RDW 13.8 11/30/2013 0301   LYMPHSABS 0.7 01/23/2024 0539   LYMPHSABS 2.0 04/17/2012 1253   MONOABS 1.4 (H) 01/23/2024 0539   MONOABS 0.9 04/17/2012 1253   EOSABS 0.0 01/23/2024 0539   EOSABS 0.1 04/17/2012 1253   BASOSABS 0.0 01/23/2024 0539   BASOSABS 0.1 04/17/2012 1253   BASOSABS 1 04/17/2012 1253    BMET    Component Value Date/Time   NA 141 01/28/2024 0532   NA 131 (L) 11/30/2013 0301   K 4.7 01/28/2024 0532   K 3.9 11/30/2013 0301   CL 110 01/28/2024 0532   CL 95 (L) 11/30/2013 0301   CO2 20 (L) 01/28/2024 0532   CO2 29 11/30/2013 0301   GLUCOSE 270 (H) 01/28/2024 0532   GLUCOSE 237 (H) 11/30/2013 0301   BUN 40 (H) 01/28/2024 0532   BUN 22 (H) 11/30/2013 0301   CREATININE 1.13 01/28/2024 0532   CREATININE 0.89 11/30/2013 0301   CALCIUM 8.7 (L) 01/28/2024 0532   CALCIUM 9.6 11/30/2013 0301   GFRNONAA >60 01/28/2024 0532   GFRNONAA >60 11/30/2013 0301    IMAGING past 24 hours No results found.    Vitals:   01/28/24 0344 01/28/24 0754 01/28/24 1232 01/28/24 1508  BP: (!) 165/87 (!) 156/82 (!) 155/81 118/70  Pulse: 84 90 77 81  Resp: 18     Temp: 97.8 F (36.6 C) 98.7 F (37.1 C) 98 F (36.7 C) 98.2 F (36.8 C)  TempSrc:  Oral Oral Oral  SpO2: 97% 98% 97% 97%  Weight:      Height:         PHYSICAL EXAM General: Well-nourished, well-developed elderly patient in no acute distress Psych:  Mood and affect appropriate for  situation CV: Regular rate and rhythm on monitor Respiratory: Respirations regular and unlabored on supplemental O2   NEURO:  awake, alert, eyes open, orientated to place, time but stated age 67 instead of 63. No aphasia but paucity of language, following all simple commands. Able to name and repeat. No gaze palsy, tracking bilaterally, visual field full, PERRL. Mild left facial droop and tongue protrusion to the left. RUE and RLE 5/5, LUE 3-/5 with significant ataxia seems out of proportion to the weakness. LLE 3-/5 proximal and knee flexion but distal ankle DF 4/5. Sensation symmetrical bilaterally but seems to have left simultagnosia, R FTN intact, gait not tested.     ASSESSMENT/PLAN  [name] is a 78 y.o. male with history of diabetes, hypertension, migraines and previous smoking admitted for acute onset left-sided weakness.  He was found to have an IPH at the right thalamocapsular junction with IVH on CT.  Overnight, he had an aspiration event and became febrile and was intubated due to concern for inability to protect airway as well as aspiration pneumonia.   NIH on Admission 5 ICH score 1  ICH:  right Thalamocapsular junction IPH with IVH, etiology: Likely hypertensive Code Stroke CT head 7 mm acute parenchymal hemorrhage centered at right thalamocapsular junction with mild surrounding edema, moderate volume IVH and no evidence of hydrocephalus CTA head & neck no LVO, severe, greater than 90% stenoses in bilateral internal carotid artery origins, no evidence of aneurysm or AVM in the region of the IPH, severe stenosis within right PCA P2 and P3 segments, severe stenosis within left PCA segment, moderate stenosis within right ICA cavernous segment Repeat head CT 2/14 stable IPH with no increase in hydrocephalus MRI unable to perform due to spinal stimulator 2D Echo EF 70 to 75% LDL 10 HgbA1c 6.6 VTE prophylaxis -Lovenox aspirin 81 mg daily prior to admission, now on No  antithrombotic secondary to IPH Therapy recommendations: SNF Disposition: Pending  Hypertension Home meds: Doxazosin 4 mg daily, lisinopril 40 mg daily, metoprolol 100 mg daily Continues to require Cleviprex, wean off as able Now on amlodipine 10, doxazosin 4, hydralazine 100 every 8h On metoprolol 50->100 bid on losartan 100 every day Blood Pressure Goal: SBP less than 160  Long-term BP goal normotensive  Hyperlipidemia Home meds: Atorvastatin 10 mg daily LDL 10, goal < 70 DC statin due to extremely low LDL with ICH  Diabetes type II Controlled Home meds: Metformin 1000 mg twice daily HgbA1c 6.6, goal < 7.0 CBGs SSI Recommend close follow-up with PCP for better DM control  Respiratory failure, resolved Aspiration pneumonia Patient was intubated 2/14 after aspiration event Extubated 2/15 Tolerating well so far Antibiotics per CCM On Unasyn Tmax 100.3-> afebrile Leukocytosis WBC 6.4--19.9--10.5--7.2--9.9  Dysphagia Patient has post-stroke dysphagia SLP consulted Now on dysphagia 3 and nectar thick liquid On tube feeding at 55 -> 80 cc/h  Other Stroke Risk Factors Advanced age Migraines  Other Active Problems AKI, creatinine 1.12--1.53--1.56--1.31--1.02--1.03--1.13 Tachycardia, HR 110s->normalized, back on metoprolol  Hospital day # 6  Neurology will sign off. Please call with questions. Pt will follow up with stroke clinic NP at [facility] in about 4 weeks. Thanks for the consult.   Marvel Plan, MD PhD Stroke Neurology 01/28/2024 5:39 PM   To contact Stroke Continuity provider, please refer to [email or web address]. After hours, contact General Neurology

## 2024-01-28 NOTE — Progress Notes (Signed)
PROGRESS NOTE    Herbert Chapman  WUJ:[id number] DOB: 1946-06-16 DOA: 01/22/2024 PCP: Gracelyn Nurse, MD   Brief Narrative:  78yo with h/o HTN and DM who presented on 2/13 with L-sided weakness.  He was found to have an intraparenchymal hemorrhage and was ultimately intubated for airway protection from 2/14-15.  He has weaned off Cleviprex and subsequently transferred to [facility] service 01/28/2024 and was planned for CIR admission however CIR does not feel that the pain will be able to reach a level to go home alone given that his significant other works.  They are now recommending SNF now.  Neurology signed off the case  Assessment and Plan:  Acute right thalamic intraparenchymal hemorrhage -CT Head demonstrated acute APH of R thalamocapsular junction with mild surrounding edema, followed by neurology -CTA Head/Neck negative for LVO or vascular malformation but demonstrating intracranial atherosclerotic disease with multifocal stenoses  -Repeat CT 2/15-stable hematoma on the right thalamic,  intraventricular with subarachnoid extension -Goal systolic blood pressure less than 160 Weaned off Cleviprex On amlodipine, doxazosin, hydralazine, metoprolol, losartan; BP is finally approaching goal -Unable to obtain MRI because of a spinal stimulator -Continue neuroprotective measures- normothermia, euglycemia, HOB greater than 30, head in neutral alignment, normocapnia, normoxia -Awaiting CIR and neurology was following but CIR will not pursue admission for this patient given that they do not feel he can reach a level to go home alone and recommending SNF now   Dysphagia -Continue tube feeds via Cortrack -SLP is following   Acute hypoxemic respiratory failure -Extubated -On Unasyn through 2/18 SpO2: 98 % O2 Flow Rate (L/min): 2 L/min FiO2 (%): 40 % -Continuous pulse oximetry and maintain O2 saturation greater 90% -Repeat chest x-ray in the a.m.   Acute kidney injury on Stage 3a CKD Metabolic  Acidosis -BUN/Cr Trend: Recent Labs  Lab 01/22/24 0832 01/23/24 0539 01/24/24 0523 01/25/24 0417 01/26/24 0438 01/27/24 0558 01/28/24 0532  BUN 15 19 32* 27* 31* 37* 40*  CREATININE 1.12 1.53* 1.56* 1.31* 1.02 1.03 1.13  -Has a Metabolic Acidosis with a CO2 of 20, AG of 11, Chloride Level of 111 -Avoid Nephrotoxic Medications, Contrast Dyes, Hypotension and Dehydration to Ensure Adequate Renal Perfusion and will need to Renally Adjust Meds -Continue to Monitor and Trend Renal Function carefully and repeat CMP in the AM  -Appears to be stable and back to relative baseline   Hypertension -On amlodipine, doxazosin, hydralazine, metoprolol, losartan; BP is finally approaching goal -No longer needing Clexiprex -Clonidine would be the next medication, if needed -Monitor blood pressures per protocol and as blood pressure reading was 159/83   Hyperlipidemia -Continued Atorvastatin this is now been discontinued by neurology given that his LDL is extremely low with an ICH   Type 2 Diabetes Mellitus  -A1c 6.6, good control -Home metformin on hold -Goal sugar less than 180 -Continue Semglee, increase dose to 25 units BID as not yet at goal -CBG Trend:  Recent Labs  Lab 01/28/24 0008 01/28/24 0142 01/28/24 0331 01/28/24 0516 01/28/24 0755 01/28/24 1230 01/28/24 1630  GLUCAP 210* 238* 238* 264* 300* 136* 237*   GERD -Continue PPI   History of anxiety depression Chronic back pain -Spinal stimulator in place -On oxy as needed for pain -home Prozac resumed   Moderate Malnutrition in the Context of Chronic Illness Nutrition Status: Nutrition Problem: Moderate Malnutrition Etiology: chronic illness Signs/Symptoms: moderate fat depletion, moderate muscle depletion Interventions: Tube feeding  Overweight -Complicates overall prognosis and care -Estimated body mass index is 29.04  kg/m as calculated from the following:   Height as of this encounter: 5\' 7"  (1.702 m).   Weight  as of this encounter: 84.1 kg.  -Weight Loss and Dietary Counseling given   DVT prophylaxis: enoxaparin (LOVENOX) injection 40 mg Start: 01/25/24 1015 Place and maintain sequential compression device Start: 01/23/24 0457    Code Status: Prior Family Communication: No family currently at bedside  Disposition Plan:  Level of care: Telemetry Medical Status is: Inpatient Remains inpatient appropriate because: Needs further clinical improvement and will need now given that CIR cannot pursue admission   Consultants:  Neurology PCCM  Procedures:  As delineated as above  Antimicrobials:  Anti-infectives (From admission, onward)    Start     Dose/Rate Route Frequency Ordered Stop   01/24/24 1230  Ampicillin-Sulbactam (UNASYN) 3 g in sodium chloride 0.9 % 100 mL IVPB        3 g 200 mL/hr over 30 Minutes Intravenous Every 6 hours 01/24/24 1142 01/27/24 1844   01/23/24 1200  piperacillin-tazobactam (ZOSYN) IVPB 3.375 g  Status:  Discontinued        3.375 g 12.5 mL/hr over 240 Minutes Intravenous Every 8 hours 01/23/24 0454 01/24/24 1142   01/23/24 0545  piperacillin-tazobactam (ZOSYN) IVPB 3.375 g        3.375 g 100 mL/hr over 30 Minutes Intravenous  Once 01/23/24 0458 01/23/24 0534       Subjective: Seen and examined at bedside and hemoglobin more awake and no acute distress.  Still the core track and was getting cleaned up.  Able to answer some questions appropriately.  No other concerns or complaints at this time.  Objective: Vitals:   01/28/24 0754 01/28/24 1232 01/28/24 1508 01/28/24 1931  BP: (!) 156/82 (!) 155/81 118/70 (!) 159/83  Pulse: 90 77 81 83  Resp:    17  Temp: 98.7 F (37.1 C) 98 F (36.7 C) 98.2 F (36.8 C) 97.8 F (36.6 C)  TempSrc: Oral Oral Oral   SpO2: 98% 97% 97% 98%  Weight:      Height:        Intake/Output Summary (Last 24 hours) at 01/28/2024 2013 Last data filed at 01/28/2024 1733 Gross per 24 hour  Intake 2041.5 ml  Output 1350 ml  Net  691.5 ml   Filed Weights   01/25/24 0500 01/26/24 0500 01/27/24 0500  Weight: 83.1 kg 83 kg 84.1 kg   Examination: Physical Exam:  Constitutional: WN/WD overweight chronically ill-appearing Respiratory: Diminished to auscultation bilaterally, no wheezing, rales, rhonchi or crackles. Normal respiratory effort and patient is not tachypenic. No accessory muscle use.  Unlabored breathing Cardiovascular: RRR, no murmurs / rubs / gallops. S1 and S2 auscultated. No extremity edema.  Abdomen: Soft, non-tender, distended secondary body habitus. Bowel sounds positive.  GU: Deferred. Musculoskeletal: He has diminished strength in the left lower and upper extremities Skin: No rashes, lesions, ulcers on limited skin evaluation. No induration; Warm and dry.  Neurologic: Has a slight facial droop on the left. Psychiatric: Normal judgment and insight.  Is awake alert and oriented x 3  Data Reviewed: I have personally reviewed following labs and imaging studies  CBC: Recent Labs  Lab 01/22/24 0832 01/23/24 0428 01/23/24 0539 01/23/24 0556 01/24/24 0523 01/25/24 0417 01/26/24 0438 01/27/24 0558 01/28/24 0532  WBC 6.4  --  19.9*  --  10.5 7.2 7.2 8.0 9.9  NEUTROABS 4.4  --  17.7*  --   --   --   --   --   --  HGB 14.0   < > 15.0   < > 12.1* 12.8* 12.8* 13.7 13.3  HCT 42.6   < > 43.2   < > 36.2* 39.0 38.9* 41.6 41.9  MCV 91.0  --  88.5  --  90.0 92.2 91.7 92.2 93.5  PLT 117*  --  190  --  128* 125* 155 154 179   < > = values in this interval not displayed.   Basic Metabolic Panel: Recent Labs  Lab 01/23/24 0539 01/23/24 0556 01/23/24 1154 01/23/24 1746 01/24/24 0523 01/24/24 1705 01/25/24 0417 01/26/24 0438 01/27/24 0557 01/27/24 0558 01/28/24 0532  NA 137   < >  --   --  135  --  141 143  --  143 141  K 4.2   < >  --   --  4.1  --  3.8 4.1  --  4.0 4.7  CL 101  --   --   --  104  --  110 108  --  110 110  CO2 20*  --   --   --  22  --  21* 24  --  21* 20*  GLUCOSE 278*  --    --   --  222*  --  279* 241*  --  203* 270*  BUN 19  --   --   --  32*  --  27* 31*  --  37* 40*  CREATININE 1.53*  --   --   --  1.56*  --  1.31* 1.02  --  1.03 1.13  CALCIUM 8.8*  --   --   --  8.2*  --  8.5* 9.4  --  9.0 8.7*  MG 1.7  --  1.6* 2.4 2.2 2.1  --   --   --   --   --   PHOS  --   --  3.6 3.8 3.0 2.4*  --   --  2.8  --   --    < > = values in this interval not displayed.   GFR: Estimated Creatinine Clearance: 55.9 mL/min (by C-G formula based on SCr of 1.13 mg/dL). Liver Function Tests: Recent Labs  Lab 01/22/24 0832 01/23/24 0539  AST 17 26  ALT 19 20  ALKPHOS 87 79  BILITOT 0.6 1.2  PROT 7.1 7.2  ALBUMIN 3.6 3.7   No results for input(s): "LIPASE", "AMYLASE" in the last 168 hours. No results for input(s): "AMMONIA" in the last 168 hours. Coagulation Profile: Recent Labs  Lab 01/22/24 0832 01/23/24 0539  INR 1.0 1.2   Cardiac Enzymes: No results for input(s): "CKTOTAL", "CKMB", "CKMBINDEX", "TROPONINI" in the last 168 hours. BNP (last 3 results) No results for input(s): "PROBNP" in the last 8760 hours. HbA1C: No results for input(s): "HGBA1C" in the last 72 hours. CBG: Recent Labs  Lab 01/28/24 0331 01/28/24 0516 01/28/24 0755 01/28/24 1230 01/28/24 1630  GLUCAP 238* 264* 300* 136* 237*   Lipid Profile: Recent Labs    01/27/24 0558  TRIG 70   Thyroid Function Tests: No results for input(s): "TSH", "T4TOTAL", "FREET4", "T3FREE", "THYROIDAB" in the last 72 hours. Anemia Panel: No results for input(s): "VITAMINB12", "FOLATE", "FERRITIN", "TIBC", "IRON", "RETICCTPCT" in the last 72 hours. Sepsis Labs: Recent Labs  Lab 01/23/24 0539 01/23/24 0854  LATICACIDVEN 2.5* 1.9    Recent Results (from the past 240 hours)  MRSA Next Gen by PCR, Nasal     Status: None   Collection Time: 01/22/24  1:07  PM   Specimen: Nasal Mucosa; Nasal Swab  Result Value Ref Range Status   MRSA by PCR Next Gen NOT DETECTED NOT DETECTED Final    Comment:  (NOTE) The GeneXpert MRSA Assay (FDA approved for NASAL specimens only), is one component of a comprehensive MRSA colonization surveillance program. It is not intended to diagnose MRSA infection nor to guide or monitor treatment for MRSA infections. Test performance is not FDA approved in patients less than 76 years old. Performed at [facility], [address], [place], [place] [address]   Culture, blood (Routine X 2) w Reflex to ID Panel     Status: None   Collection Time: 01/23/24  5:39 AM   Specimen: BLOOD  Result Value Ref Range Status   Specimen Description BLOOD LEFT ANTECUBITAL  Final   Special Requests   Final    BOTTLES DRAWN AEROBIC AND ANAEROBIC Blood Culture adequate volume   Culture   Final    NO GROWTH 5 DAYS Performed at [facility], [address] [place], [place] [address]    Report Status 01/28/2024 FINAL  Final  Culture, blood (Routine X 2) w Reflex to ID Panel     Status: None   Collection Time: 01/23/24  8:54 AM   Specimen: BLOOD  Result Value Ref Range Status   Specimen Description BLOOD BLOOD RIGHT HAND  Final   Special Requests   Final    BOTTLES DRAWN AEROBIC AND ANAEROBIC Blood Culture results may not be optimal due to an inadequate volume of blood received in culture bottles   Culture   Final    NO GROWTH 5 DAYS Performed at [facility], [address], [place], [place] [address]    Report Status 01/28/2024 FINAL  Final  Culture, Respiratory w Gram Stain     Status: None   Collection Time: 01/24/24  4:52 AM   Specimen: Tracheal Aspirate; Respiratory  Result Value Ref Range Status   Specimen Description TRACHEAL ASPIRATE  Final   Special Requests NONE  Final   Gram Stain   Final    FEW WBC PRESENT,BOTH PMN AND MONONUCLEAR NO ORGANISMS SEEN    Culture   Final    NO GROWTH 2 DAYS Performed at [facility], [address] [place], [place] [address]    Report Status 01/26/2024 FINAL  Final    Radiology Studies: DG  Swallowing Func-Speech Pathology Result Date: 01/27/2024 Table formatting from the original result was not included. Modified Barium Swallow Study Patient Details Name: JAAN FISCHEL MRN: [id number] Date of Birth: 04/11/1946 Today's Date: 01/27/2024 HPI/PMH: HPI: 78 y.o. male presents to [facility] hospital on 01/22/2024 with L weakness. 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 Pt intubated on 2/14; extubated 2/15. PMH includes HTN, DMII, pancreatitis, hepatitis C, GERD, migraines, anxiety, depression, cervical spine surgery. Clinical Impression: Pt has an oropharyngeal dysphagia with slow posterior transit and mastication. Mistiming of swallow initiation, with boluses sitting briefly in the pharynx, results in aspiration of thin liquids before the swallow. This is mostly silent (PAS 8), but some coughing was noted during the study in the absence of other aspiration and could possibly be indicative of a delayed response. Aspiration is not prevented with a chin tuck, but no aspiration is observed with any other consistency tested (PAS 2 with nectar thick liquids). Recommend Dys 3 (mechanical soft) diet and nectar/mildly thick liquids with ongoing SLP follow up. DIGEST Swallow Severity Rating*  Safety: 2  Efficiency: 0  Overall Pharyngeal Swallow Severity: 2 1: mild; 2: moderate; 3: severe; 4: profound *The Dynamic Imaging Grade of Swallowing Toxicity is standardized for the head and neck cancer population, however, demonstrates promising clinical applications across populations to standardize the clinical rating of pharyngeal swallow safety and severity. Factors that may increase risk of adverse event in presence of aspiration Rubye Oaks & Clearance Coots 2021): Factors that may increase risk of adverse event in presence of aspiration Rubye Oaks & Clearance Coots 2021): Reduced cognitive function; Limited mobility; Presence of tubes (ETT, trach, NG, etc.) Recommendations/Plan: Swallowing Evaluation Recommendations Swallowing Evaluation Recommendations  Recommendations: PO diet PO Diet Recommendation: Dysphagia 3 (Mechanical soft); Mildly thick liquids (Level 2, nectar thick) Liquid Administration via: Cup; Straw Medication Administration: Whole meds with liquid Supervision: Staff to assist with self-feeding; Full supervision/cueing for swallowing strategies Swallowing strategies  : Slow rate; Small bites/sips Postural changes: Position pt fully upright for meals; Stay upright 30-60 min after meals Oral care recommendations: Oral care BID (2x/day) Caregiver Recommendations: Avoid jello, ice cream, thin soups, popsicles; Remove water pitcher Treatment Plan Treatment Plan Treatment recommendations: Therapy as outlined in treatment plan below Follow-up recommendations: Acute inpatient rehab (3 hours/day) Functional status assessment: Patient has had a recent decline in their functional status and demonstrates the ability to make significant improvements in function in a reasonable and predictable amount of time. Treatment frequency: Min 2x/week Treatment duration: 2 weeks Interventions: Aspiration precaution training; Compensatory techniques; Patient/family education; Trials of upgraded texture/liquids; Diet toleration management by SLP Recommendations Recommendations for follow up therapy are one component of a multi-disciplinary discharge planning process, led by the attending physician.  Recommendations may be updated based on patient status, additional functional criteria and insurance authorization. Assessment: Orofacial Exam: Orofacial Exam Oral Cavity: Oral Hygiene: WFL Oral Cavity - Dentition: Adequate natural dentition; Missing dentition Orofacial Anatomy: WFL Anatomy: Anatomy: WFL Boluses Administered: Boluses Administered Boluses Administered: Thin liquids (Level 0); Mildly thick liquids (Level 2, nectar thick); Moderately thick liquids (Level 3, honey thick); Puree; Solid  Oral Impairment Domain: Oral Impairment Domain Lip Closure: Interlabial escape, no  progression to anterior lip Tongue control during bolus hold: Posterior escape of less than half of bolus Bolus preparation/mastication: Slow prolonged chewing/mashing with complete recollection Bolus transport/lingual motion: Slow tongue motion Oral residue: Trace residue lining oral structures Location of oral residue : Tongue Initiation of pharyngeal swallow : Pyriform sinuses  Pharyngeal Impairment Domain: Pharyngeal Impairment Domain Soft palate elevation: No bolus between soft palate (SP)/pharyngeal wall (PW) Laryngeal elevation: Partial superior movement of thyroid cartilage/partial approximation of arytenoids to epiglottic petiole Anterior hyoid excursion: Partial anterior movement Epiglottic movement: Complete inversion Laryngeal vestibule closure: Incomplete, narrow column air/contrast in laryngeal vestibule Pharyngeal stripping wave : Present - complete Pharyngeal contraction (A/P view only): N/A Pharyngoesophageal segment opening: Complete distension and complete duration, no obstruction of flow Tongue base retraction: Trace column of contrast or air between tongue base and PPW Pharyngeal residue: Complete pharyngeal clearance Location of pharyngeal residue: N/A  Esophageal Impairment Domain: Esophageal Impairment Domain Esophageal clearance upright position: Esophageal retention with retrograde flow below pharyngoesophageal segment (PES) Pill: Pill Consistency administered: Mildly thick liquids (Level 2, nectar thick) Mildly thick liquids (Level 2, nectar thick): WFL Penetration/Aspiration Scale Score: Penetration/Aspiration Scale Score 1.  Material does not enter airway: Moderately thick liquids (Level 3, honey thick); Puree; Solid; Pill 2.  Material enters airway, remains ABOVE vocal cords then ejected out: Mildly thick liquids (Level 2, nectar thick) 8.  Material enters airway, passes BELOW cords without attempt  by patient to eject out (silent aspiration) : Thin liquids (Level 0) Compensatory  Strategies: Compensatory Strategies Compensatory strategies: Yes Straw: Effective Effective Straw: Mildly thick liquid (Level 2, nectar thick) Chin tuck: Ineffective Ineffective Chin Tuck: Thin liquid (Level 0)   General Information: Caregiver present: No  Diet Prior to this Study: NPO; Cortrak/Small bore NG tube   Temperature : Normal   Respiratory Status: WFL   Supplemental O2: None (Room air)   History of Recent Intubation: Yes  Behavior/Cognition: Alert; Cooperative; Pleasant mood; Requires cueing Self-Feeding Abilities: Able to self-feed Baseline vocal quality/speech: Hypophonia/low volume Volitional Cough: Able to elicit Volitional Swallow: Able to elicit Exam Limitations: No limitations Goal Planning: Prognosis for improved oropharyngeal function: Good Barriers to Reach Goals: Cognitive deficits No data recorded Patient/Family Stated Goal: wants water Consulted and agree with results and recommendations: Patient Pain: Pain Assessment Pain Assessment: Faces Faces Pain Scale: 0 End of Session: Start Time:SLP Start Time (ACUTE ONLY): 0955 Stop Time: SLP Stop Time (ACUTE ONLY): 1012 Time Calculation:SLP Time Calculation (min) (ACUTE ONLY): 17 min Charges: SLP Evaluations $ SLP Speech Visit: 1 Visit SLP Evaluations $BSS Swallow: 1 Procedure $MBS Swallow: 1 Procedure SLP visit diagnosis: SLP Visit Diagnosis: Dysphagia, oropharyngeal phase (R13.12) Past Medical History: Past Medical History: Diagnosis Date  Anxiety   Arthritis   Cancer (HCC)   squamous cell CA removed from skin  Depression   Diabetes mellitus without complication (HCC)   GERD (gastroesophageal reflux disease)   rare-no meds  Headache   h/o migraines  Hepatitis   Hep C-pt had injections and pill and pt states he does not have hep c anymore  History of kidney stones   h/o age 43  Hypertension   Mental disorder   Pancreatitis 2012  Personal history of tobacco use, presenting hazards to health 11/07/2015 Past Surgical History: Past Surgical History:  Procedure Laterality Date  CERVICAL SPINE SURGERY    CIRCUMCISION    COLONOSCOPY    EYE SURGERY    bilateral cataract removal; implants  LUMBAR LAMINECTOMY/DECOMPRESSION MICRODISCECTOMY  10/07/2012  Procedure: LUMBAR LAMINECTOMY/DECOMPRESSION MICRODISCECTOMY 2 LEVELS;  Surgeon: Tia Alert, MD;  Location: [facility];  Service: Neurosurgery;  Laterality: Left;  Left Lumbar two-three,lumbar four-five hemilaminectomy  PILONIDAL CYST EXCISION    SPINAL CORD STIMULATOR IMPLANT  2018  [employer]  TONSILLECTOMY    TOTAL SHOULDER ARTHROPLASTY Right 10/13/2019  Procedure: TOTAL SHOULDER ARTHROPLASTY;  Surgeon: Lyndle Herrlich, MD;  Location: [facility] ORS;  Service: Orthopedics;  Laterality: Right; Mahala Menghini., M.A. CCC-SLP Acute Rehabilitation Services Office [phone] Secure chat preferred 01/27/2024, 11:44 AM  Scheduled Meds:  amLODipine  10 mg Per Tube Daily   Chlorhexidine Gluconate Cloth  6 each Topical Daily   doxazosin  4 mg Per Tube Daily   enoxaparin (LOVENOX) injection  40 mg Subcutaneous Q24H   famotidine  20 mg Per Tube BID   feeding supplement (OSMOLITE 1.5 CAL)  1,000 mL Per Tube Q24H   feeding supplement (PROSource TF20)  60 mL Per Tube Daily   FLUoxetine  60 mg Per Tube [id number]   Gerhardt's butt cream   Topical TID   hydrALAZINE  100 mg Per Tube Q8H   insulin aspart  0-15 Units Subcutaneous Q4H   insulin aspart  12 Units Subcutaneous Q4H   insulin glargine-yfgn  25 Units Subcutaneous BID   losartan  100 mg Per Tube Daily   metoprolol tartrate  100 mg Per Tube BID   multivitamin with minerals  1 tablet Per  Tube Daily   mouth rinse  15 mL Mouth Rinse Q2H   thiamine  100 mg Per Tube Daily   Continuous Infusions:   LOS: 6 days   Marguerita Merles, DO [facility] Available via Epic secure chat 7am-7pm After these hours, please refer to coverage provider listed on [email or web address] 01/28/2024, 8:13 PM

## 2024-01-28 NOTE — Progress Notes (Signed)
Occupational Therapy Treatment Patient Details Name: Herbert Chapman MRN: [id number] DOB: May 03, 1946 Today's Date: 01/28/2024   History of present illness 78 y.o. male presents to [facility] on 01/22/2024 with L weakness. 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 Pt intubated on 2/14-2/15. PMH includes HTN, DMII, pancreatitis, hepatitis C, GERD, migraines, anxiety, depression.   OT comments  Pt progressing toward established OT goals. Dc recommendation updated secondary to CIR notes state pt without 24/7 support at home, although OT believes pt remains excellent candidate. Challenging strength, balance, return to ADL and transfers this session. Cues for midline orientation when upright and min A for transfers. Pt with weakness and decreased coordination of LUE with poor functional grip. Will continue to follow.       If plan is discharge home, recommend the following:  Two people to help with walking and/or transfers;Two people to help with bathing/dressing/bathroom;Assistance with cooking/housework;Direct supervision/assist for medications management;Direct supervision/assist for financial management;Assist for transportation;Help with stairs or ramp for entrance;Assistance with feeding   Equipment Recommendations  Other (comment) (defer)    Recommendations for Other Services      Precautions / Restrictions Precautions Precautions: Fall;Other (comment) Precaution/Restrictions Comments: L hemi, coretrak Restrictions Weight Bearing Restrictions Per Provider Order: No       Mobility Bed Mobility Overal bed mobility: Needs Assistance Bed Mobility: Supine to Sit     Supine to sit: Mod assist     General bed mobility comments: assist with LLE and for trunk. A for bringing R hip out towrd EOB    Transfers Overall transfer level: Needs assistance Equipment used: Rolling walker (2 wheels), 1 person hand held assist Transfers: Sit to/from Stand, Bed to chair/wheelchair/BSC Sit to Stand:  Min assist, +2 physical assistance, +2 safety/equipment           General transfer comment: min A +2 up with stedy and mod cues for midline position/avoidance of L lateral lean. Min A to manage L arm throughout due to poor grip strength Transfer via Lift Equipment: Stedy   Balance Overall balance assessment: Needs assistance Sitting-balance support: Single extremity supported, Feet supported Sitting balance-Leahy Scale: Poor Sitting balance - Comments: L lean, able to correct with min verbal/tactile cues Postural control: Left lateral lean Standing balance support: Bilateral upper extremity supported, During functional activity Standing balance-Leahy Scale: Poor Standing balance comment: min-mod A to mainatin static standing balance as well as cues to midline                           ADL either performed or assessed with clinical judgement   ADL Overall ADL's : Needs assistance/impaired Eating/Feeding: Sitting;Cueing for safety;Minimal assistance Eating/Feeding Details (indicate cue type and reason): min A for bilateral tasks; i.e.                     Toilet Transfer: Minimal assistance;+2 for physical assistance;+2 for safety/equipment (stedy)   Toileting- Clothing Manipulation and Hygiene: Total assistance;Bed level Toileting - Clothing Manipulation Details (indicate cue type and reason): bottom red RN aware       General ADL Comments: Pt with mild L inattention throughout session, but able to locate all items on food tray without difficulty, just mild R bias. Pt with greater balance and activity toelrance this session but needs cues to avoid L lean in sititng and standing    Extremity/Trunk Assessment Upper Extremity Assessment Upper Extremity Assessment: LUE deficits/detail LUE Deficits / Details: L side weakness (  UE>LE), moves against gravity elbow flexion 4-/5 shoulder flexion to full range but poor coordination to keep elbow straight to avoid hand  hitting face. pt ataxic. able to make thumbs up, ok sign. Pt with trigger finger in 2nd and 3rd digits he reports is baseline. very weak in hand/digits as compared to gross motor strength with poor functional grasp LUE Coordination: decreased gross motor;decreased fine motor   Lower Extremity Assessment Lower Extremity Assessment: Defer to PT evaluation        Vision       Perception     Praxis Praxis Praxis: Impaired Praxis Impairment Details: Limb apraxia Praxis-Other Comments: difficulty with motor control, improving howerver   Communication Communication Communication: Impaired Factors Affecting Communication: Difficulty expressing self   Cognition Arousal: Alert Behavior During Therapy: WFL for tasks assessed/performed Cognition: Cognition impaired     Awareness: Intellectual awareness intact, Online awareness impaired Memory impairment (select all impairments): Short-term memory Attention impairment (select first level of impairment): Sustained attention Executive functioning impairment (select all impairments): Organization, Sequencing, Problem solving OT - Cognition Comments: Pt follows commands and is conversational. Decresaed insight into problem solving in spite of deficits. Pleasant and conversational overall. poor memory noted                 Following commands: Intact (for 1-2 step commands)        Cueing   Cueing Techniques: Verbal cues, Tactile cues  Exercises Exercises: Other exercises Other Exercises Other Exercises: lateral lean onto L elbow and return to midline. pushing and pulling with LUE to lateral lean R in chair. poor functional grip for pull so OT providing resistance needed at forearm. Finger to nose 10x. Composite flexion/extension 10x    Shoulder Instructions       General Comments VSS    Pertinent Vitals/ Pain       Pain Assessment Pain Assessment: Faces Faces Pain Scale: No hurt Pain Intervention(s): Monitored during  session  Home Living                                          Prior Functioning/Environment              Frequency  Min 1X/week        Progress Toward Goals  OT Goals(current goals can now be found in the care plan section)  Progress towards OT goals: Progressing toward goals  Acute Rehab OT Goals Patient Stated Goal: get better OT Goal Formulation: With patient Time For Goal Achievement: 02/09/24 Potential to Achieve Goals: Good ADL Goals Pt Will Perform Grooming: with set-up;sitting Pt Will Perform Upper Body Bathing: with set-up;sitting Pt Will Perform Lower Body Bathing: with min assist;sitting/lateral leans;sit to/from stand Pt Will Transfer to Toilet: stand pivot transfer;with min assist Pt/caregiver will Perform Home Exercise Program: Left upper extremity  Plan      Co-evaluation                 AM-PAC OT "6 Clicks" Daily Activity     Outcome Measure   Help from another person eating meals?: A Little Help from another person taking care of personal grooming?: A Lot Help from another person toileting, which includes using toliet, bedpan, or urinal?: Total Help from another person bathing (including washing, rinsing, drying)?: A Lot Help from another person to put on and taking off regular upper body clothing?: A Lot Help from  another person to put on and taking off regular lower body clothing?: A Lot 6 Click Score: 12    End of Session Equipment Utilized During Treatment: Gait belt;Other (comment) (stedy)  OT Visit Diagnosis: Unsteadiness on feet (R26.81);Muscle weakness (generalized) (M62.81);Other abnormalities of gait and mobility (R26.89);Ataxia, unspecified (R27.0);Other symptoms and signs involving cognitive function;Hemiplegia and hemiparesis Hemiplegia - Right/Left: Left Hemiplegia - dominant/non-dominant: Non-Dominant   Activity Tolerance Patient tolerated treatment well   Patient Left in chair;with call bell/phone  within reach;with chair alarm set   Nurse Communication Mobility status;Other (comment)        Time: 1610-9604 OT Time Calculation (min): 46 min  Charges: OT General Charges $OT Visit: 1 Visit OT Treatments $Self Care/Home Management : 23-37 mins $Therapeutic Exercise: 8-22 mins  Tyler Deis, OTR/L [facility] Acute Rehabilitation Office: [phone]   Myrla Halsted 01/28/2024, 5:51 PM

## 2024-01-29 DIAGNOSIS — I610 Nontraumatic intracerebral hemorrhage in hemisphere, subcortical: Secondary | ICD-10-CM | POA: Diagnosis not present

## 2024-01-29 DIAGNOSIS — I6381 Other cerebral infarction due to occlusion or stenosis of small artery: Secondary | ICD-10-CM | POA: Diagnosis not present

## 2024-01-29 DIAGNOSIS — I619 Nontraumatic intracerebral hemorrhage, unspecified: Secondary | ICD-10-CM | POA: Diagnosis not present

## 2024-01-29 DIAGNOSIS — J9601 Acute respiratory failure with hypoxia: Secondary | ICD-10-CM | POA: Diagnosis not present

## 2024-01-29 LAB — GLUCOSE, CAPILLARY
Glucose-Capillary: 170 mg/dL — ABNORMAL HIGH (ref 70–99)
Glucose-Capillary: 172 mg/dL — ABNORMAL HIGH (ref 70–99)
Glucose-Capillary: 193 mg/dL — ABNORMAL HIGH (ref 70–99)
Glucose-Capillary: 230 mg/dL — ABNORMAL HIGH (ref 70–99)
Glucose-Capillary: 235 mg/dL — ABNORMAL HIGH (ref 70–99)
Glucose-Capillary: 246 mg/dL — ABNORMAL HIGH (ref 70–99)
Glucose-Capillary: 256 mg/dL — ABNORMAL HIGH (ref 70–99)
Glucose-Capillary: 268 mg/dL — ABNORMAL HIGH (ref 70–99)
Glucose-Capillary: 280 mg/dL — ABNORMAL HIGH (ref 70–99)
Glucose-Capillary: 319 mg/dL — ABNORMAL HIGH (ref 70–99)

## 2024-01-29 LAB — CBC WITH DIFFERENTIAL/PLATELET
Abs Immature Granulocytes: 0.12 10*3/uL — ABNORMAL HIGH (ref 0.00–0.07)
Basophils Absolute: 0 10*3/uL (ref 0.0–0.1)
Basophils Relative: 0 %
Eosinophils Absolute: 0.1 10*3/uL (ref 0.0–0.5)
Eosinophils Relative: 1 %
HCT: 38.5 % — ABNORMAL LOW (ref 39.0–52.0)
Hemoglobin: 12.9 g/dL — ABNORMAL LOW (ref 13.0–17.0)
Immature Granulocytes: 1 %
Lymphocytes Relative: 13 %
Lymphs Abs: 1.6 10*3/uL (ref 0.7–4.0)
MCH: 30.1 pg (ref 26.0–34.0)
MCHC: 33.5 g/dL (ref 30.0–36.0)
MCV: 89.7 fL (ref 80.0–100.0)
Monocytes Absolute: 1.2 10*3/uL — ABNORMAL HIGH (ref 0.1–1.0)
Monocytes Relative: 10 %
Neutro Abs: 8.9 10*3/uL — ABNORMAL HIGH (ref 1.7–7.7)
Neutrophils Relative %: 75 %
Platelets: 163 10*3/uL (ref 150–400)
RBC: 4.29 MIL/uL (ref 4.22–5.81)
RDW: 13.1 % (ref 11.5–15.5)
WBC: 11.8 10*3/uL — ABNORMAL HIGH (ref 4.0–10.5)
nRBC: 0 % (ref 0.0–0.2)

## 2024-01-29 LAB — COMPREHENSIVE METABOLIC PANEL
ALT: 28 U/L (ref 0–44)
AST: 17 U/L (ref 15–41)
Albumin: 2.8 g/dL — ABNORMAL LOW (ref 3.5–5.0)
Alkaline Phosphatase: 62 U/L (ref 38–126)
Anion gap: 10 (ref 5–15)
BUN: 30 mg/dL — ABNORMAL HIGH (ref 8–23)
CO2: 21 mmol/L — ABNORMAL LOW (ref 22–32)
Calcium: 8.7 mg/dL — ABNORMAL LOW (ref 8.9–10.3)
Chloride: 106 mmol/L (ref 98–111)
Creatinine, Ser: 1.06 mg/dL (ref 0.61–1.24)
GFR, Estimated: >60 mL/min (ref >60)
Glucose, Bld: 201 mg/dL — ABNORMAL HIGH (ref 70–99)
Potassium: 4 mmol/L (ref 3.5–5.1)
Sodium: 137 mmol/L (ref 135–145)
Total Bilirubin: 0.5 mg/dL (ref 0.0–1.2)
Total Protein: 5.9 g/dL — ABNORMAL LOW (ref 6.5–8.1)

## 2024-01-29 LAB — PHOSPHORUS: Phosphorus: 3.3 mg/dL (ref 2.5–4.6)

## 2024-01-29 LAB — MAGNESIUM: Magnesium: 2 mg/dL (ref 1.7–2.4)

## 2024-01-29 MED ORDER — FAMOTIDINE 20 MG PO TABS
20.0000 mg | ORAL_TABLET | Freq: Two times a day (BID) | ORAL | Status: DC
  Administered 2024-01-29 – 2024-01-30 (×3): 20 mg via ORAL
  Filled 2024-01-29 (×3): qty 1

## 2024-01-29 MED ORDER — METOPROLOL TARTRATE 50 MG PO TABS
100.0000 mg | ORAL_TABLET | Freq: Two times a day (BID) | ORAL | Status: DC
  Administered 2024-01-29 – 2024-01-30 (×3): 100 mg via ORAL
  Filled 2024-01-29 (×3): qty 2

## 2024-01-29 MED ORDER — INSULIN ASPART 100 UNIT/ML IJ SOLN
0.0000 [IU] | Freq: Three times a day (TID) | INTRAMUSCULAR | Status: DC
  Administered 2024-01-30: 5 [IU] via SUBCUTANEOUS

## 2024-01-29 MED ORDER — AMLODIPINE BESYLATE 5 MG PO TABS
10.0000 mg | ORAL_TABLET | Freq: Every day | ORAL | Status: DC
  Administered 2024-01-29 – 2024-01-30 (×2): 10 mg via ORAL
  Filled 2024-01-29 (×2): qty 2

## 2024-01-29 MED ORDER — LOSARTAN POTASSIUM 50 MG PO TABS
100.0000 mg | ORAL_TABLET | Freq: Every day | ORAL | Status: DC
  Administered 2024-01-29 – 2024-01-30 (×2): 100 mg via ORAL
  Filled 2024-01-29 (×2): qty 2

## 2024-01-29 MED ORDER — HYDRALAZINE HCL 50 MG PO TABS
100.0000 mg | ORAL_TABLET | Freq: Three times a day (TID) | ORAL | Status: DC
  Administered 2024-01-29 – 2024-01-30 (×4): 100 mg via ORAL
  Filled 2024-01-29 (×4): qty 2

## 2024-01-29 MED ORDER — ADULT MULTIVITAMIN W/MINERALS CH
1.0000 | ORAL_TABLET | Freq: Every day | ORAL | Status: DC
  Administered 2024-01-29 – 2024-01-30 (×2): 1 via ORAL
  Filled 2024-01-29 (×2): qty 1

## 2024-01-29 MED ORDER — FLUOXETINE HCL 20 MG PO CAPS
60.0000 mg | ORAL_CAPSULE | Freq: Every day | ORAL | Status: DC
  Administered 2024-01-30: 60 mg via ORAL
  Filled 2024-01-29: qty 3

## 2024-01-29 MED ORDER — OXYCODONE HCL 5 MG PO TABS
5.0000 mg | ORAL_TABLET | ORAL | Status: DC | PRN
Start: 2024-01-29 — End: 2024-01-30
  Administered 2024-01-29 – 2024-01-30 (×2): 5 mg via ORAL
  Filled 2024-01-29 (×2): qty 1

## 2024-01-29 MED ORDER — INSULIN ASPART 100 UNIT/ML IJ SOLN: 0.0000 [IU] | Freq: Every day | INTRAMUSCULAR | Status: DC

## 2024-01-29 MED ORDER — DOXAZOSIN MESYLATE 4 MG PO TABS
4.0000 mg | ORAL_TABLET | Freq: Every day | ORAL | Status: DC
  Administered 2024-01-29 – 2024-01-30 (×2): 4 mg via ORAL
  Filled 2024-01-29 (×2): qty 1

## 2024-01-29 MED ORDER — PROSOURCE PLUS PO LIQD
30.0000 mL | Freq: Two times a day (BID) | ORAL | Status: DC
  Administered 2024-01-29: 30 mL via ORAL
  Filled 2024-01-29: qty 30

## 2024-01-29 NOTE — Evaluation (Signed)
Speech Language Pathology Evaluation Patient Details Name: Herbert Chapman MRN: [id number] DOB: [birth year] Today's Date: 01/29/2024 Time: 1308-6578 SLP Time Calculation (min) (ACUTE ONLY): 26 min  Problem List:  Patient Active Problem List   Diagnosis Date Noted   Right thalamic stroke (HCC) 01/23/2024   Acute hypoxemic respiratory failure (HCC) 01/23/2024   Stroke, hemorrhagic (HCC) 01/23/2024   ICH (intracerebral hemorrhage) (HCC) 01/22/2024   Status post total shoulder arthroplasty, right 10/13/2019   Depression 01/13/2017   Hepatitis C 01/13/2017   Hypertension 01/13/2017   Squamous cell carcinoma 01/13/2017   Type 2 diabetes mellitus (HCC) 01/13/2017   Atherosclerosis 02/23/2016   CKD (chronic kidney disease) stage 3, GFR 30-59 ml/min (HCC) 02/23/2016   Personal history of tobacco use, presenting hazards to health 11/07/2015   Benign non-nodular prostatic hyperplasia with lower urinary tract symptoms 03/16/2015   Chronic back pain 03/16/2015   Past Medical History:  Past Medical History:  Diagnosis Date   Anxiety    Arthritis    Cancer (HCC)    squamous cell CA removed from skin   Depression    Diabetes mellitus without complication (HCC)    GERD (gastroesophageal reflux disease)    rare-no meds   Headache    h/o migraines   Hepatitis    Hep C-pt had injections and pill and pt states he does not have hep c anymore   History of kidney stones    h/o age 74   Hypertension    Mental disorder    Pancreatitis 2012   Personal history of tobacco use, presenting hazards to health 11/07/2015   Past Surgical History:  Past Surgical History:  Procedure Laterality Date   CERVICAL SPINE SURGERY     CIRCUMCISION     COLONOSCOPY     EYE SURGERY     bilateral cataract removal; implants   LUMBAR LAMINECTOMY/DECOMPRESSION MICRODISCECTOMY  10/07/2012   Procedure: LUMBAR LAMINECTOMY/DECOMPRESSION MICRODISCECTOMY 2 LEVELS;  Surgeon: Tia Alert, MD;  Location: [facility];   Service: Neurosurgery;  Laterality: Left;  Left Lumbar two-three,lumbar four-five hemilaminectomy   PILONIDAL CYST EXCISION     SPINAL CORD STIMULATOR IMPLANT  2018   [employer]   TONSILLECTOMY     TOTAL SHOULDER ARTHROPLASTY Right 10/13/2019   Procedure: TOTAL SHOULDER ARTHROPLASTY;  Surgeon: Lyndle Herrlich, MD;  Location: [facility] ORS;  Service: Orthopedics;  Laterality: Right;   HPI:  78 y.o. male presents to [facility] hospital on 01/22/2024 with L weakness. 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 Pt intubated on 2/14; extubated 2/15. PMH includes HTN, DMII, pancreatitis, hepatitis C, GERD, migraines, anxiety, depression, cervical spine surgery.   Assessment / Plan / Recommendation Clinical Impression  Pt presents with mild cognitive deficits in the areas of temporal orientation and memory as noted on the Cognistat exam. His speech is intelligible and language is functional in conversation and subtests assessed. He was noted to have delayed processing of information intermittently throughout evaluation. He has mild left facial weakness and decreased sensation. Pt would benefit from continued ST on acute and at discharge venue which is now SNF.    SLP Assessment  SLP Recommendation/Assessment: Patient needs continued Speech Lanaguage Pathology Services SLP Visit Diagnosis: Cognitive communication deficit (R41.841)    Recommendations for follow up therapy are one component of a multi-disciplinary discharge planning process, led by the attending physician.  Recommendations may be updated based on patient status, additional functional criteria and insurance authorization.    Follow Up Recommendations  Other (comment) (  disposition is now SNF)    Assistance Recommended at Discharge  Frequent or constant Supervision/Assistance  Functional Status Assessment Patient has had a recent decline in their functional status and demonstrates the ability to make significant improvements in function in a  reasonable and predictable amount of time.  Frequency and Duration min 2x/week  2 weeks      SLP Evaluation Cognition  Overall Cognitive Status: Impaired/Different from baseline Arousal/Alertness: Awake/alert Orientation Level: Oriented to person;Oriented to place;Oriented to situation;Disoriented to time Year: 2025 Day of Week: Incorrect Attention: Sustained Sustained Attention: Appears intact Memory: Impaired Memory Impairment: Retrieval deficit Awareness: Impaired Awareness Impairment: Anticipatory impairment (suspect anticipatory) Problem Solving: Appears intact (for verbal) Safety/Judgment: Impaired       Comprehension  Auditory Comprehension Overall Auditory Comprehension: Appears within functional limits for tasks assessed Visual Recognition/Discrimination Discrimination: Not tested Reading Comprehension Reading Status: Not tested    Expression Expression Primary Mode of Expression: Verbal Verbal Expression Overall Verbal Expression: Appears within functional limits for tasks assessed Initiation: No impairment Level of Generative/Spontaneous Verbalization: Conversation Repetition: No impairment Naming: No impairment Pragmatics: No impairment Written Expression Written Expression: Not tested   Oral / Motor  Oral Motor/Sensory Function Overall Oral Motor/Sensory Function: Mild impairment Facial ROM: Reduced left;Suspected CN VII (facial) dysfunction Facial Symmetry: Abnormal symmetry left;Suspected CN VII (facial) dysfunction Facial Sensation: Reduced left;Suspected CN V (Trigeminal) dysfunction Lingual ROM: Within Functional Limits Lingual Symmetry: Within Functional Limits Motor Speech Overall Motor Speech: Appears within functional limits for tasks assessed Respiration: Within functional limits Phonation: Normal Resonance: Within functional limits Articulation: Within functional limitis Intelligibility: Intelligible Motor Planning: Witnin functional  limits Motor Speech Errors: Not applicable            Herbert Chapman 01/29/2024, 11:37 AM

## 2024-01-29 NOTE — Consult Note (Signed)
[facility] Liaison Consult Note   01/29/2024  Herbert Chapman 1946/07/09 [id number]  Insurance: [employer]   Primary Care Provider: Gracelyn Nurse, MD, [facility], this provider is listed for the transition of care follow up appointments.    [facility] Liaison met patient at bedside at [facility]. Rounding on unit, no family at bedside currently. Patient resting quietly.    The patient was screened for 7 day readmission hospitalization with noted medium risk score for unplanned readmission risk. This was a transfer from [facility] to [facility] and NOT a readmission.   The patient was assessed for potential [facility] Coordination service needs for post hospital transition for care coordination. Review of patient's electronic medical record reveals patient was being recommended  for CIR but PMR notes patient did not have 24/7 care needed for post CIR.  Plan: [facility] Liaison will continue to follow progress and disposition to asess for post hospital community care coordination/management needs.  Referral request for community care coordination: Following, patient is now for potential SNF.   [facility] Community Care, Population Health does not replace or interfere with any arrangements made by the Inpatient Transition of Care team.   For questions contact:   Charlesetta Shanks, RN, BSN, CCM [place]  [facility], [facility] Liaison Direct Dial: [phone] or secure chat Email: [email or web address]

## 2024-01-29 NOTE — Progress Notes (Signed)
Nutrition Follow-up  DOCUMENTATION CODES:  Not applicable  INTERVENTION:  Continue current diet as ordered Encourage PO intake Mighty Shake TID with meals, each supplement provides 330 kcals and 9 grams of protein MVI with minerals daily If pt eats at least 75% of his lunch tray, ok with removing cortrak and discontinuing enteral feeds  NUTRITION DIAGNOSIS:  Moderate Malnutrition related to chronic illness as evidenced by moderate fat depletion, moderate muscle depletion. - remains applicable  GOAL:  Patient will meet greater than or equal to 90% of their needs - progressing  MONITOR:  TF tolerance  REASON FOR ASSESSMENT:  Consult Enteral/tube feeding initiation and management  ASSESSMENT:  Pt with PMH of DM, HTN, and migraines admitted for nontraumatic ICH.   2/13 - admit with R IPH with IVH 2/14 - intubated due to aspiration and inability to protect airway 2/15 - extubated 2/18 - MBS, DYS 3, nectar thick, transitioned to nocturnal feeds  Pt resting in bed at the time of assessment eating lunch. RN in room administering medications. Discussed intake with pt, states he is doing well. RN reports 100% completion of his breakfast this AM (which with supplement provides 928 kcal and 26g protein) . Pt drinking his mighty shake and states that he likes them and has been consuming the ones he has received on his tray.  MD requested evaluation of intake to determine if cortrak could be removed. If pt has good intake of lunch, ok with removing cortak and discontinuing enteral feeds.   Admit weight: 89.1 kg ? Accuracy  Current weight: 84.1 kg  Weight loss noted, but weight since 2/15 has been consistent. Initial weight could be inaccurate.  Average Meal Intake: 2/19: 43% intake x 2 recorded meals  Nutritionally Relevant Medications: Scheduled Meds:  doxazosin  4 mg Per Tube Daily   famotidine  20 mg Per Tube BID   OSMOLITE 1.5 CAL  1,000 mL Per Tube Q24H   PROSource TF20  60  mL Per Tube Daily   insulin aspart  0-15 Units Subcutaneous Q4H   insulin aspart  12 Units Subcutaneous Q4H   insulin glargine-yfgn  25 Units Subcutaneous BID   multivitamin with minerals  1 tablet Per Tube Daily   thiamine  100 mg Per Tube Daily   PRN Meds: ondansetron  Labs Reviewed: BUN 30 CBG ranges from 136-298 mg/dL over the last 24 hours HgbA1c 6.6%  NUTRITION - FOCUSED PHYSICAL EXAM: Flowsheet Row Most Recent Value  Orbital Region Moderate depletion  Upper Arm Region Moderate depletion  Thoracic and Lumbar Region Mild depletion  Buccal Region Unable to assess  Temple Region Moderate depletion  Clavicle Bone Region Moderate depletion  Clavicle and Acromion Bone Region Moderate depletion  Scapular Bone Region Unable to assess  Dorsal Hand Moderate depletion  Patellar Region Severe depletion  Anterior Thigh Region Severe depletion  Posterior Calf Region Severe depletion  Edema (RD Assessment) None  Hair Reviewed  Eyes Unable to assess  Mouth Unable to assess  Skin Reviewed  Nails Reviewed   Diet Order:   Diet Order             DIET DYS 3 Room service appropriate? Yes with Assist; Fluid consistency: Nectar Thick  Diet effective now                   EDUCATION NEEDS:  Not appropriate for education at this time  Skin:  Skin Assessment: Reviewed RN Assessment  Last BM:  2/18 loose stool in rectal  tube observed during f/u, but nurse reported diarrhea has improved  Height:  Ht Readings from Last 1 Encounters:  01/22/24 5\' 7"  (1.702 m)   Weight:  Wt Readings from Last 1 Encounters:  01/29/24 84.1 kg   BMI:  Body mass index is 29.04 kg/m.  Estimated Nutritional Needs:  Kcal:  2000-2200 Protein:  110-125 grams Fluid:  > 2L /day    Greig Castilla, RD, LDN Registered Dietitian II Please reach out via secure chat Weekend on-call pager # available in [facility]

## 2024-01-29 NOTE — Progress Notes (Signed)
PROGRESS NOTE    Herbert Chapman  ZOX:[id number] DOB: May 18, 1946 DOA: 01/22/2024 PCP: Gracelyn Nurse, MD   Brief Narrative:  78yo with h/o HTN and DM who presented on 2/13 with L-sided weakness.  He was found to have an intraparenchymal hemorrhage and was ultimately intubated for airway protection from 2/14-15.  He has weaned off Cleviprex and subsequently transferred to [facility] service 01/28/2024 and was planned for CIR admission however CIR does not feel that the pain will be able to reach a level to go home alone given that his significant other works.  They are now recommending SNF now.  Neurology signed off the case and we are initiating Calorie Count to see if Cortrak can be removed as patient's diet is advanced to Dysphagia 3 with Nectar Thick Liquids.   Assessment and Plan:  Acute Right Thalamic Intraparenchymal Hemorrhage -CT Head demonstrated acute APH of R thalamocapsular junction with mild surrounding edema, followed by neurology -CTA Head/Neck negative for LVO or vascular malformation but demonstrating intracranial atherosclerotic disease with multifocal stenoses  -Repeat CT 2/15-stable hematoma on the right thalamic,  intraventricular with subarachnoid extension -Goal systolic blood pressure less than 160 Weaned off Cleviprex On amlodipine, doxazosin, hydralazine, metoprolol, losartan; BP is finally approaching goal -Unable to obtain MRI because of a spinal stimulator -Continue neuroprotective measures- normothermia, euglycemia, HOB greater than 30, head in neutral alignment, normocapnia, normoxia -Neurology was following but CIR will not pursue admission for this patient given that they do not feel he can reach a level to go home alone and recommending SNF now -Will try to remove Cortrak in order to pursue SNF   Dysphagia -Continue tube feeds via Cortrak but diet is advanced so May be able to remove Cortrak today -SLP is following and evaluated and recommending Dysphagia 3 Nectar  Thick Liquids now -Initiated Calorie Count to see if Cortrak can be removed   Acute Respiratory Failure with Hypoxia -Extubated -On Unasyn through 2/18 SpO2: 99 % O2 Flow Rate (L/min): 2 L/min FiO2 (%): 40 % -Continuous pulse oximetry and maintain O2 saturation greater 90% -Repeat chest x-ray in the a.m.   Acute kidney injury on Stage 3a CKD Metabolic Acidosis, mild -BUN/Cr Trend: Recent Labs  Lab 01/23/24 0539 01/24/24 0523 01/25/24 0417 01/26/24 0438 01/27/24 0558 01/28/24 0532 01/29/24 0633  BUN 19 32* 27* 31* 37* 40* 30*  CREATININE 1.53* 1.56* 1.31* 1.02 1.03 1.13 1.06  -Has a mild Metabolic Acidosis with a CO2 of 21, AG of 10, Chloride Level of 106 -Avoid Nephrotoxic Medications, Contrast Dyes, Hypotension and Dehydration to Ensure Adequate Renal Perfusion and will need to Renally Adjust Meds -Continue to Monitor and Trend Renal Function carefully and repeat CMP in the AM  -Appears to be stable and back to relative baseline   Hypertension -On Amlodipine, Doxazosin, Hydralazine, Metoprolol, Losartan; BP is finally approaching goal -No longer needing Clexiprex -Clonidine would be the next medication, if needed -Monitor blood pressures per protocol and as blood pressure reading was 142/77   Hyperlipidemia -Continued Atorvastatin this is now been discontinued by Neurology given that his LDL is extremely low with an ICH   Type 2 Diabetes Mellitus  -A1c 6.6, good control -Home metformin on hold -Goal sugar less than 180 -Continue Semglee, increased dose to 25 units BID as not yet at goal -On Novolog 12 units sq q4h and will hold of Increasing to 18 units q4h if Cortrak can be removed -CBG Trend:  Recent Labs  Lab 01/29/24 0007 01/29/24 0029  01/29/24 0349 01/29/24 0440 01/29/24 0831 01/29/24 1205 01/29/24 1239  GLUCAP 230* 246* 193* 235* 172* 280* 319*   GERD -Continue Famotidine 20 mg po BID   History of Anxiety and Depression Chronic back pain -Spinal  stimulator in place -Continue Oxycodone IR 5 mg po q4h as needed for pain -Home Fluoxetine 60 mg po Daily resumed    Moderate Malnutrition in the Context of Chronic Illness Nutrition Status: Nutrition Problem: Moderate Malnutrition Etiology: chronic illness Signs/Symptoms: moderate fat depletion, moderate muscle depletion Interventions: Tube feeding -Continue p.o. intake and has a is on a diet and the nutritionist is recommending mighty shakes 3 times daily with meals multivitamin with minerals daily and evaluating to see if we can remove his Cortrak  Hypoalbuminemia -Patient's Albumin Trend: Recent Labs  Lab 01/22/24 0832 01/23/24 0539 01/29/24 0633  ALBUMIN 3.6 3.7 2.8*  -Continue to Monitor and Trend and repeat CMP in the AM  Overweight -Complicates overall prognosis and care -Estimated body mass index is 29.04 kg/m as calculated from the following:   Height as of this encounter: 5\' 7"  (1.702 m).   Weight as of this encounter: 84.1 kg.  -Weight Loss and Dietary Counseling given   DVT prophylaxis: enoxaparin (LOVENOX) injection 40 mg Start: 01/25/24 1015 Place and maintain sequential compression device Start: 01/23/24 0457    Code Status: Prior Family Communication: No family currently at bedside  Disposition Plan:  Level of care: Telemetry Medical Status is: Inpatient Remains inpatient appropriate because: Needs SNF   Consultants:  Neurology  PCCM  Procedures:  As delineated as above  ECHOCARDIOGRAM IMPRESSIONS     1. Left ventricular ejection fraction, by estimation, is 70 to 75%. Left  ventricular ejection fraction by PLAX is 72 %. The left ventricle has  hyperdynamic function. The left ventricle has no regional wall motion  abnormalities. There is moderate  concentric left ventricular hypertrophy. Left ventricular diastolic  parameters are consistent with Grade I diastolic dysfunction (impaired  relaxation).   2. Right ventricular systolic function is  normal. The right ventricular  size is normal. Tricuspid regurgitation signal is inadequate for assessing  PA pressure.   3. Left atrial size was moderately dilated.   4. The mitral valve is normal in structure. Trivial mitral valve  regurgitation.   5. The aortic valve is tricuspid. Aortic valve regurgitation is not  visualized.   FINDINGS   Left Ventricle: Left ventricular ejection fraction, by estimation, is 70  to 75%. Left ventricular ejection fraction by PLAX is 72 %. The left  ventricle has hyperdynamic function. The left ventricle has no regional  wall motion abnormalities. Definity  contrast agent was given IV to delineate the left ventricular endocardial  borders. Strain imaging was not performed. The left ventricular internal  cavity size was normal in size. There is moderate concentric left  ventricular hypertrophy. Left ventricular   diastolic parameters are consistent with Grade I diastolic dysfunction  (impaired relaxation). Indeterminate filling pressures.   Right Ventricle: The right ventricular size is normal. No increase in  right ventricular wall thickness. Right ventricular systolic function is  normal. Tricuspid regurgitation signal is inadequate for assessing PA  pressure.   Left Atrium: Left atrial size was moderately dilated.   Right Atrium: Right atrial size was not well visualized.   Pericardium: Trivial pericardial effusion is present.   Mitral Valve: The mitral valve is normal in structure. Trivial mitral  valve regurgitation.   Tricuspid Valve: The tricuspid valve is normal in structure. Tricuspid  valve regurgitation is trivial.   Aortic Valve: The aortic valve is tricuspid. Aortic valve regurgitation is  not visualized.   Pulmonic Valve: The pulmonic valve was normal in structure. Pulmonic valve  regurgitation is trivial.   Aorta: The aortic root and ascending aorta are structurally normal, with  no evidence of dilitation.   Venous: IVC  assessment for right atrial pressure unable to be performed  due to mechanical ventilation.   IAS/Shunts: The interatrial septum was not well visualized.   Additional Comments: 3D imaging was not performed.     LEFT VENTRICLE  PLAX 2D  LV EF:         Left            Diastology                 ventricular     LV e' medial:    4.20 cm/s                 ejection        LV E/e' medial:  12.9                 fraction by     LV e' lateral:   4.20 cm/s                 PLAX is 72      LV E/e' lateral: 12.9                 %.  LVIDd:         3.70 cm  LVIDs:         2.20 cm  LV PW:         1.30 cm  LV IVS:        1.30 cm  LVOT diam:     2.00 cm  LV SV:         54  LV SV Index:   27  LVOT Area:     3.14 cm     RIGHT VENTRICLE  RV S prime:     17.90 cm/s  TAPSE (M-mode): 1.9 cm   LEFT ATRIUM           Index  LA diam:      3.60 cm 1.79 cm/m  LA Vol (A4C): 71.3 ml 35.53 ml/m   AORTIC VALVE  LVOT Vmax:   101.00 cm/s  LVOT Vmean:  73.700 cm/s  LVOT VTI:    0.173 m    AORTA  Ao Root diam: 2.90 cm  Ao Asc diam:  3.20 cm   MITRAL VALVE  MV Area (PHT): 2.42 cm    SHUNTS  MV Decel Time: 314 msec    Systemic VTI:  0.17 m  MV E velocity: 54.10 cm/s  Systemic Diam: 2.00 cm  MV A velocity: 71.00 cm/s  MV E/A ratio:  0.76   Antimicrobials:  Anti-infectives (From admission, onward)    Start     Dose/Rate Route Frequency Ordered Stop   01/24/24 1230  Ampicillin-Sulbactam (UNASYN) 3 g in sodium chloride 0.9 % 100 mL IVPB        3 g 200 mL/hr over 30 Minutes Intravenous Every 6 hours 01/24/24 1142 01/27/24 1844   01/23/24 1200  piperacillin-tazobactam (ZOSYN) IVPB 3.375 g  Status:  Discontinued        3.375 g 12.5 mL/hr over 240 Minutes Intravenous Every 8 hours 01/23/24 0454 01/24/24 1142   01/23/24 0545  piperacillin-tazobactam (ZOSYN) IVPB 3.375 g  3.375 g 100 mL/hr over 30 Minutes Intravenous  Once 01/23/24 0458 01/23/24 0534       Subjective: Seen examined at  bedside and he is in no acute distress and feels okay.  Denies any complaint.  Happy that his rectal tube is out and hoping that his Cortrak comes out.  No lightheadedness or dizziness.  No other concerns or complaints at this time.  Objective: Vitals:   01/29/24 0832 01/29/24 1142 01/29/24 1239 01/29/24 1525  BP: (!) 162/78 (!) 158/80 (!) 141/80 (!) 142/77  Pulse: 76 79 92 68  Resp: 18 18 18 18   Temp: 99.5 F (37.5 C) 98.9 F (37.2 C) 97.9 F (36.6 C) 98.3 F (36.8 C)  TempSrc: Oral Oral Oral Oral  SpO2: 98%  99% 100%  Weight:      Height:        Intake/Output Summary (Last 24 hours) at 01/29/2024 1536 Last data filed at 01/29/2024 1300 Gross per 24 hour  Intake 1205.5 ml  Output 1250 ml  Net -44.5 ml   Filed Weights   01/26/24 0500 01/27/24 0500 01/29/24 0500  Weight: 83 kg 84.1 kg 84.1 kg   Examination: Physical Exam:  Constitutional: WN/WD overweight Caucasian male in NAD Respiratory: Diminished to auscultation bilaterally, no wheezing, rales, rhonchi or crackles. Normal respiratory effort and patient is not tachypenic. No accessory muscle use.  Cardiovascular: RRR, no murmurs / rubs / gallops. S1 and S2 auscultated. No extremity edema. Abdomen: Soft, non-tender, Distended 2/2 body habitus. Bowel sounds positive.  GU: Deferred. Musculoskeletal: No clubbing / cyanosis of digits/nails. No joint deformity upper and lower extremities. Skin: No rashes, lesions, ulcers on a limited skin evaluation. No induration; Warm and dry.  Neurologic: Has a slight facial droop on the Left but rest of the CN 2-12 grossly intact Psychiatric: Normal judgment and insight. Alert and oriented x 3. Normal mood and appropriate affect.   Data Reviewed: I have personally reviewed following labs and imaging studies  CBC: Recent Labs  Lab 01/23/24 0539 01/23/24 0556 01/25/24 0417 01/26/24 0438 01/27/24 0558 01/28/24 0532 01/29/24 0633  WBC 19.9*   < > 7.2 7.2 8.0 9.9 11.8*  NEUTROABS  17.7*  --   --   --   --   --  8.9*  HGB 15.0   < > 12.8* 12.8* 13.7 13.3 12.9*  HCT 43.2   < > 39.0 38.9* 41.6 41.9 38.5*  MCV 88.5   < > 92.2 91.7 92.2 93.5 89.7  PLT 190   < > 125* 155 154 179 163   < > = values in this interval not displayed.   Basic Metabolic Panel: Recent Labs  Lab 01/23/24 1154 01/23/24 1746 01/24/24 0523 01/24/24 1705 01/25/24 0417 01/26/24 0438 01/27/24 0557 01/27/24 0558 01/28/24 0532 01/29/24 0633  NA  --   --  135  --  141 143  --  143 141 137  K  --   --  4.1  --  3.8 4.1  --  4.0 4.7 4.0  CL  --   --  104  --  110 108  --  110 110 106  CO2  --   --  22  --  21* 24  --  21* 20* 21*  GLUCOSE  --   --  222*  --  279* 241*  --  203* 270* 201*  BUN  --   --  32*  --  27* 31*  --  37* 40* 30*  CREATININE  --   --  1.56*  --  1.31* 1.02  --  1.03 1.13 1.06  CALCIUM  --   --  8.2*  --  8.5* 9.4  --  9.0 8.7* 8.7*  MG 1.6* 2.4 2.2 2.1  --   --   --   --   --  2.0  PHOS 3.6 3.8 3.0 2.4*  --   --  2.8  --   --  3.3   GFR: Estimated Creatinine Clearance: 59.5 mL/min (by C-G formula based on SCr of 1.06 mg/dL). Liver Function Tests: Recent Labs  Lab 01/23/24 0539 01/29/24 0633  AST 26 17  ALT 20 28  ALKPHOS 79 62  BILITOT 1.2 0.5  PROT 7.2 5.9*  ALBUMIN 3.7 2.8*   No results for input(s): "LIPASE", "AMYLASE" in the last 168 hours. No results for input(s): "AMMONIA" in the last 168 hours. Coagulation Profile: Recent Labs  Lab 01/23/24 0539  INR 1.2   Cardiac Enzymes: No results for input(s): "CKTOTAL", "CKMB", "CKMBINDEX", "TROPONINI" in the last 168 hours. BNP (last 3 results) No results for input(s): "PROBNP" in the last 8760 hours. HbA1C: No results for input(s): "HGBA1C" in the last 72 hours. CBG: Recent Labs  Lab 01/29/24 0440 01/29/24 0831 01/29/24 1205 01/29/24 1239 01/29/24 1526  GLUCAP 235* 172* 280* 319* 256*   Lipid Profile: Recent Labs    01/27/24 0558  TRIG 70   Thyroid Function Tests: No results for input(s):  "TSH", "T4TOTAL", "FREET4", "T3FREE", "THYROIDAB" in the last 72 hours. Anemia Panel: No results for input(s): "VITAMINB12", "FOLATE", "FERRITIN", "TIBC", "IRON", "RETICCTPCT" in the last 72 hours. Sepsis Labs: Recent Labs  Lab 01/23/24 0539 01/23/24 0854  LATICACIDVEN 2.5* 1.9   Recent Results (from the past 240 hours)  MRSA Next Gen by PCR, Nasal     Status: None   Collection Time: 01/22/24  1:07 PM   Specimen: Nasal Mucosa; Nasal Swab  Result Value Ref Range Status   MRSA by PCR Next Gen NOT DETECTED NOT DETECTED Final    Comment: (NOTE) The GeneXpert MRSA Assay (FDA approved for NASAL specimens only), is one component of a comprehensive MRSA colonization surveillance program. It is not intended to diagnose MRSA infection nor to guide or monitor treatment for MRSA infections. Test performance is not FDA approved in patients less than 86 years old. Performed at [facility], [address] [place], [place] [address]   Culture, blood (Routine X 2) w Reflex to ID Panel     Status: None   Collection Time: 01/23/24  5:39 AM   Specimen: BLOOD  Result Value Ref Range Status   Specimen Description BLOOD LEFT ANTECUBITAL  Final   Special Requests   Final    BOTTLES DRAWN AEROBIC AND ANAEROBIC Blood Culture adequate volume   Culture   Final    NO GROWTH 5 DAYS Performed at [facility], [address], [place], [place] [address]    Report Status 01/28/2024 FINAL  Final  Culture, blood (Routine X 2) w Reflex to ID Panel     Status: None   Collection Time: 01/23/24  8:54 AM   Specimen: BLOOD  Result Value Ref Range Status   Specimen Description BLOOD BLOOD RIGHT HAND  Final   Special Requests   Final    BOTTLES DRAWN AEROBIC AND ANAEROBIC Blood Culture results may not be optimal due to an inadequate volume of blood received in culture bottles   Culture  Final    NO GROWTH 5 DAYS Performed at [facility], [address] [place], [place] [address]    Report  Status 01/28/2024 FINAL  Final  Culture, Respiratory w Gram Stain     Status: None   Collection Time: 01/24/24  4:52 AM   Specimen: Tracheal Aspirate; Respiratory  Result Value Ref Range Status   Specimen Description TRACHEAL ASPIRATE  Final   Special Requests NONE  Final   Gram Stain   Final    FEW WBC PRESENT,BOTH PMN AND MONONUCLEAR NO ORGANISMS SEEN    Culture   Final    NO GROWTH 2 DAYS Performed at [facility], [address], [place], [place] [address]    Report Status 01/26/2024 FINAL  Final    Radiology Studies: No results found.  Scheduled Meds:  (feeding supplement) PROSource Plus  30 mL Oral BID BM   amLODipine  10 mg Oral Daily   doxazosin  4 mg Oral Daily   enoxaparin (LOVENOX) injection  40 mg Subcutaneous Q24H   famotidine  20 mg Oral BID   feeding supplement (OSMOLITE 1.5 CAL)  1,000 mL Per Tube Q24H   [START ON 01/30/2024] FLUoxetine  60 mg Oral Daily   Gerhardt's butt cream   Topical TID   hydrALAZINE  100 mg Oral Q8H   insulin aspart  0-15 Units Subcutaneous Q4H   insulin aspart  12 Units Subcutaneous Q4H   insulin glargine-yfgn  25 Units Subcutaneous BID   losartan  100 mg Oral Daily   metoprolol tartrate  100 mg Oral BID   multivitamin with minerals  1 tablet Oral Daily   Continuous Infusions:   LOS: 7 days   Merlene Laughter, DO [facility] Available via Epic secure chat 7am-7pm After these hours, please refer to coverage provider listed on [email or web address] 01/29/2024, 3:36 PM

## 2024-01-29 NOTE — Discharge Summary (Signed)
Physician Discharge Summary   Patient: Herbert Chapman MRN: [id number] DOB: [birth year]  Admit date:     01/22/2024  Discharge date: 01/30/2024  Discharge Physician: Marguerita Merles, DO   PCP: Gracelyn Nurse, MD   Recommendations at discharge:   Follow-up with PCP within 1 to 2 weeks repeat CBC, CMP, mag, Phos within 1 week Follow-up with neurology within 1 to 2 weeks and continue no antithrombotics secondary to IPH; will need to see stroke clinic nurse practitioner at [facility] in about 4 weeks Follow-up on final Blood Cultures  Discharge Diagnoses: Principal Problem:   Stroke, hemorrhagic (HCC) Active Problems:   ICH (intracerebral hemorrhage) (HCC)   Right thalamic stroke (HCC)   Acute hypoxemic respiratory failure (HCC)  Resolved Problems:   * No resolved hospital problems. [facility] Course: 78yo with h/o HTN and DM who presented on 2/13 with L-sided weakness.  He was found to have an intraparenchymal hemorrhage and was ultimately intubated for airway protection from 2/14-15.  He has weaned off Cleviprex and subsequently transferred to [facility] service 01/28/2024 and was planned for CIR admission however CIR does not feel that the pain will be able to reach a level to go home alone given that his significant other works.  They are now recommending SNF now.  Neurology signed off the case and we are initiating Calorie Count to see if Cortrak can be removed as patient's diet is advanced to Dysphagia 3 with Nectar Thick Liquids.  This was removed and patient is doing well.  Today his white blood cell count went up slightly and now is slowly improving.  No signs and symptoms of infection but will resume antibiotics with p.o. Augmentin given his recent aspiration event.  He is medically stable for discharge at this time will need to follow-up with PCP and neurology in outpatient setting and follow-up on the follow blood cultures as they have been drawn.  Assessment and Plan:  Acute Right Thalamic  Intraparenchymal Hemorrhage -CT Head demonstrated acute APH of R thalamocapsular junction with mild surrounding edema, followed by neurology -CTA Head/Neck negative for LVO or vascular malformation but demonstrating intracranial atherosclerotic disease with multifocal stenoses  -Repeat CT 2/15-stable hematoma on the right thalamic,  intraventricular with subarachnoid extension -Goal systolic blood pressure less than 160 Weaned off Cleviprex On amlodipine, doxazosin, hydralazine, metoprolol, losartan; BP is finally approaching goal -Unable to obtain MRI because of a spinal stimulator -Continue neuroprotective measures- normothermia, euglycemia, HOB greater than 30, head in neutral alignment, normocapnia, normoxia -Neurology was following but CIR will not pursue admission for this patient given that they do not feel he can reach a level to go home alone and recommending SNF now -Was able to remove Cortrak and now pursuing SNF and tolerating his diet   Dysphagia -Continue tube feeds via Cortrak but diet is advanced so May be able to remove Cortrak today -SLP is following and evaluated and recommending Dysphagia 3 Nectar Thick Liquids now -Initiated Calorie Count to see if Cortrak can be removed   Acute Respiratory Failure with Hypoxia in the setting of Aspiration Event -Extubated now -On Unasyn through 2/18 but WBC worsening now off of Abx as Trend Showing: Recent Labs  Lab 01/25/24 0417 01/26/24 0438 01/27/24 0558 01/28/24 0532 01/29/24 0633 01/30/24 0559 01/30/24 1203  WBC 7.2 7.2 8.0 9.9 11.8* 14.1* 13.5*  SpO2: 97 % O2 Flow Rate (L/min): 2 L/min FiO2 (%): 40 % -Repeat blood cultures x 2 and still pending.  Urinalysis unremarkable and  repeat chest x-ray unremarkable -Resumed antibiotics with p.o. Augmentin for 5 more days -Continuous pulse oximetry and maintain O2 saturation greater 90% -Repeat chest x-ray in 3 to 6 weeks   Acute Kidney Injury on Stage 3a CKD Metabolic  Acidosis, mild -BUN/Cr Trend: Recent Labs  Lab 01/24/24 0523 01/25/24 0417 01/26/24 0438 01/27/24 0558 01/28/24 0532 01/29/24 0633 01/30/24 0559  BUN 32* 27* 31* 37* 40* 30* 24*  CREATININE 1.56* 1.31* 1.02 1.03 1.13 1.06 0.99  -Has a mild Metabolic Acidosis with a CO2 of 21, AG of 10, Chloride Level of 106 -Avoid Nephrotoxic Medications, Contrast Dyes, Hypotension and Dehydration to Ensure Adequate Renal Perfusion and will need to Renally Adjust Meds -Continue to Monitor and Trend Renal Function carefully and repeat CMP in the AM  -Appears to be stable and back to relative baseline   Hypertension -On Amlodipine, Doxazosin, Hydralazine, Metoprolol, Losartan; BP is finally approaching goal -No longer needing Clexiprex -Clonidine would be the next medication, if needed -Monitor blood pressures per protocol and as blood pressure reading was 142/77   Hyperlipidemia -Continued Atorvastatin this is now been discontinued by Neurology given that his LDL is extremely low with an ICH   Type 2 Diabetes Mellitus  -A1c 6.6, good control -Home metformin on hold -Goal sugar less than 180 -Continue Semglee, increased dose to 25 units BID as not yet at goal -On Novolog 12 units sq q4h and will hold of Increasing to 18 units q4h if Cortrak can be removed -CBG Trend:  Recent Labs  Lab 01/29/24 0831 01/29/24 1205 01/29/24 1239 01/29/24 1526 01/29/24 2105 01/30/24 0643 01/30/24 1215  GLUCAP 172* 280* 319* 256* 170* 93 214*   GERD -Continue Famotidine 20 mg po BID   History of Anxiety and Depression Chronic Back Pain -Spinal stimulator in place -Continue Oxycodone IR 5 mg po q4h as needed for pain while hospitalized and resume home dose of 5 times daily at SNF -Home Fluoxetine 60 mg po Daily resumed    Moderate Malnutrition in the Context of Chronic Illness Nutrition Status: Nutrition Problem: Moderate Malnutrition Etiology: chronic illness Signs/Symptoms: moderate fat depletion,  moderate muscle depletion Interventions: Tube feeding -Continue p.o. intake and has a is on a diet and the nutritionist is recommending mighty shakes 3 times daily with meals multivitamin with minerals daily and evaluating to see if we can remove his Cortrak  Hypoalbuminemia -Patient's Albumin Trend: Recent Labs  Lab 01/22/24 0832 01/23/24 0539 01/29/24 0633 01/30/24 0559  ALBUMIN 3.6 3.7 2.8* 2.9*  -Continue to Monitor and Trend and repeat CMP in the AM  Overweight -Complicates overall prognosis and care -Estimated body mass index is 28.73 kg/m as calculated from the following:   Height as of this encounter: 5\' 7"  (1.702 m).   Weight as of this encounter: 83.2 kg.  -Weight Loss and Dietary Counseling given  Nutrition Documentation    Flowsheet Row Admission (Current) from 01/22/2024 in [facility]  Nutrition Problem Moderate Malnutrition  Etiology chronic illness  Nutrition Goal Patient will meet greater than or equal to 90% of their needs  Interventions Tube feeding     Consultants: Neurology Procedures performed: As delineated as above  Disposition: Skilled nursing facility Diet recommendation:  Dysphagia type 3 Nectar Liquid DISCHARGE MEDICATION: Allergies as of 01/30/2024   No Known Allergies      Medication List     STOP taking these medications    aspirin 81 MG tablet   atorvastatin 10 MG tablet Commonly known as:  LIPITOR   lisinopril 40 MG tablet Commonly known as: ZESTRIL   metoprolol succinate 100 MG 24 hr tablet Commonly known as: TOPROL-XL       TAKE these medications    acetaminophen 325 MG tablet Commonly known as: TYLENOL Take 2 tablets (650 mg total) by mouth every 4 (four) hours as needed for mild pain (pain score 1-3) (or temp > 37.5 C (99.5 F)).   amLODipine 10 MG tablet Commonly known as: NORVASC Take 1 tablet (10 mg total) by mouth daily. Start taking on: January 31, 2024   amoxicillin-clavulanate 875-125  MG tablet Commonly known as: AUGMENTIN Take 1 tablet by mouth every 12 (twelve) hours for 5 days.   doxazosin 4 MG tablet Commonly known as: CARDURA Take 4 mg by mouth at bedtime.   famotidine 20 MG tablet Commonly known as: PEPCID Take 1 tablet (20 mg total) by mouth 2 (two) times daily.   FLUoxetine 20 MG capsule Commonly known as: PROZAC Take 60 mg by mouth every morning.   Gerhardt's butt cream Crea Apply 1 Application topically 3 (three) times daily.   hydrALAZINE 100 MG tablet Commonly known as: APRESOLINE Take 1 tablet (100 mg total) by mouth every 8 (eight) hours.   insulin glargine-yfgn 100 UNIT/ML injection Commonly known as: SEMGLEE Inject 0.25 mLs (25 Units total) into the skin 2 (two) times daily.   ipratropium-albuterol 0.5-2.5 (3) MG/3ML Soln Commonly known as: DUONEB Take 3 mLs by nebulization every 4 (four) hours as needed.   losartan 100 MG tablet Commonly known as: COZAAR Take 1 tablet (100 mg total) by mouth daily. Start taking on: January 31, 2024   metFORMIN 1000 MG tablet Commonly known as: GLUCOPHAGE Take 1,000 mg by mouth 2 (two) times daily with a meal.   metoprolol tartrate 100 MG tablet Commonly known as: LOPRESSOR Take 1 tablet (100 mg total) by mouth 2 (two) times daily.   multivitamin with minerals Tabs tablet Take 1 tablet by mouth daily. Start taking on: January 31, 2024   oxyCODONE 5 MG immediate release tablet Commonly known as: Oxy IR/ROXICODONE Take 1 tablet (5 mg total) by mouth 5 (five) times daily.        Contact information for follow-up providers     [facility]. Schedule an appointment as soon as possible for a visit in 1 month(s).   Specialty: Neurology Why: stroke clinic Contact information: [address] [place] [address] [phone]             Contact information for after-discharge care     Destination     [facility] [address],  [place] [facility] Preferred SNF .   Service: Skilled Nursing Contact information: [address] [place] [address] [phone]                    Discharge Exam: Ceasar Mons Weights   01/27/24 0500 01/29/24 0500 01/30/24 0427  Weight: 84.1 kg 84.1 kg 83.2 kg   Vitals:   01/30/24 1043 01/30/24 1208  BP: 99/84 124/67  Pulse:  66  Resp:    Temp:  98.4 F (36.9 C)  SpO2:  97%   Examination: Physical Exam:  Constitutional: WN/WD overweight chronically ill-appearing Caucasian male in no acute distress Respiratory: Diminished to auscultation bilaterally, no wheezing, rales, rhonchi or crackles. Normal respiratory effort and patient is not tachypenic. No accessory muscle use.  Unlabored breathing Cardiovascular: RRR, no murmurs / rubs / gallops. S1 and S2 auscultated.  No extremity edema..  Abdomen: Soft, non-tender, slightly distended secondary to body habitus. Bowel sounds positive.  GU: Deferred. Musculoskeletal: No clubbing / cyanosis of digits/nails. No joint deformity upper and lower extremities.  Skin: No rashes, lesions, ulcers on limited skin evaluation. No induration; Warm and dry.  Neurologic: CN 2-12 grossly intact with no focal deficits. Romberg sign and cerebellar reflexes not assessed.  Psychiatric: Normal judgment and insight. Alert and oriented x 3. Normal mood and appropriate affect.   Condition at discharge: stable  The results of significant diagnostics from this hospitalization (including imaging, microbiology, ancillary and laboratory) are listed below for reference.   Imaging Studies: DG CHEST PORT 1 VIEW Result Date: 01/30/2024 CLINICAL DATA:  Aspiration into the airway.  Leukocytosis. EXAM: PORTABLE CHEST 1 VIEW COMPARISON:  One-view chest x-ray 01/24/2024 FINDINGS: Heart size that the heart is mildly enlarged, stable. Dorsal spinal stimulator is noted. Lungs are clear. Lung volumes are low. Right shoulder hemiarthroplasty is again noted. IMPRESSION:  1. Low lung volumes. 2. No acute cardiopulmonary disease. Electronically Signed   By: Marin Roberts M.D.   On: 01/30/2024 13:06   DG Swallowing Func-Speech Pathology Result Date: 01/27/2024 Table formatting from the original result was not included. Modified Barium Swallow Study Patient Details Name: COSME JACOB MRN: [id number] Date of Birth: 1946-11-01 Today's Date: 01/27/2024 HPI/PMH: HPI: 78 y.o. male presents to [facility] hospital on 01/22/2024 with L weakness. 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 Pt intubated on 2/14; extubated 2/15. PMH includes HTN, DMII, pancreatitis, hepatitis C, GERD, migraines, anxiety, depression, cervical spine surgery. Clinical Impression: Pt has an oropharyngeal dysphagia with slow posterior transit and mastication. Mistiming of swallow initiation, with boluses sitting briefly in the pharynx, results in aspiration of thin liquids before the swallow. This is mostly silent (PAS 8), but some coughing was noted during the study in the absence of other aspiration and could possibly be indicative of a delayed response. Aspiration is not prevented with a chin tuck, but no aspiration is observed with any other consistency tested (PAS 2 with nectar thick liquids). Recommend Dys 3 (mechanical soft) diet and nectar/mildly thick liquids with ongoing SLP follow up. DIGEST Swallow Severity Rating*  Safety: 2  Efficiency: 0  Overall Pharyngeal Swallow Severity: 2 1: mild; 2: moderate; 3: severe; 4: profound *The Dynamic Imaging Grade of Swallowing Toxicity is standardized for the head and neck cancer population, however, demonstrates promising clinical applications across populations to standardize the clinical rating of pharyngeal swallow safety and severity. Factors that may increase risk of adverse event in presence of aspiration Rubye Oaks & Clearance Coots 2021): Factors that may increase risk of adverse event in presence of aspiration Rubye Oaks & Clearance Coots 2021): Reduced cognitive function; Limited  mobility; Presence of tubes (ETT, trach, NG, etc.) Recommendations/Plan: Swallowing Evaluation Recommendations Swallowing Evaluation Recommendations Recommendations: PO diet PO Diet Recommendation: Dysphagia 3 (Mechanical soft); Mildly thick liquids (Level 2, nectar thick) Liquid Administration via: Cup; Straw Medication Administration: Whole meds with liquid Supervision: Staff to assist with self-feeding; Full supervision/cueing for swallowing strategies Swallowing strategies  : Slow rate; Small bites/sips Postural changes: Position pt fully upright for meals; Stay upright 30-60 min after meals Oral care recommendations: Oral care BID (2x/day) Caregiver Recommendations: Avoid jello, ice cream, thin soups, popsicles; Remove water pitcher Treatment Plan Treatment Plan Treatment recommendations: Therapy as outlined in treatment plan below Follow-up recommendations: Acute inpatient rehab (3 hours/day) Functional status assessment: Patient has had a recent decline in their functional status and demonstrates the ability to  make significant improvements in function in a reasonable and predictable amount of time. Treatment frequency: Min 2x/week Treatment duration: 2 weeks Interventions: Aspiration precaution training; Compensatory techniques; Patient/family education; Trials of upgraded texture/liquids; Diet toleration management by SLP Recommendations Recommendations for follow up therapy are one component of a multi-disciplinary discharge planning process, led by the attending physician.  Recommendations may be updated based on patient status, additional functional criteria and insurance authorization. Assessment: Orofacial Exam: Orofacial Exam Oral Cavity: Oral Hygiene: WFL Oral Cavity - Dentition: Adequate natural dentition; Missing dentition Orofacial Anatomy: WFL Anatomy: Anatomy: WFL Boluses Administered: Boluses Administered Boluses Administered: Thin liquids (Level 0); Mildly thick liquids (Level 2, nectar  thick); Moderately thick liquids (Level 3, honey thick); Puree; Solid  Oral Impairment Domain: Oral Impairment Domain Lip Closure: Interlabial escape, no progression to anterior lip Tongue control during bolus hold: Posterior escape of less than half of bolus Bolus preparation/mastication: Slow prolonged chewing/mashing with complete recollection Bolus transport/lingual motion: Slow tongue motion Oral residue: Trace residue lining oral structures Location of oral residue : Tongue Initiation of pharyngeal swallow : Pyriform sinuses  Pharyngeal Impairment Domain: Pharyngeal Impairment Domain Soft palate elevation: No bolus between soft palate (SP)/pharyngeal wall (PW) Laryngeal elevation: Partial superior movement of thyroid cartilage/partial approximation of arytenoids to epiglottic petiole Anterior hyoid excursion: Partial anterior movement Epiglottic movement: Complete inversion Laryngeal vestibule closure: Incomplete, narrow column air/contrast in laryngeal vestibule Pharyngeal stripping wave : Present - complete Pharyngeal contraction (A/P view only): N/A Pharyngoesophageal segment opening: Complete distension and complete duration, no obstruction of flow Tongue base retraction: Trace column of contrast or air between tongue base and PPW Pharyngeal residue: Complete pharyngeal clearance Location of pharyngeal residue: N/A  Esophageal Impairment Domain: Esophageal Impairment Domain Esophageal clearance upright position: Esophageal retention with retrograde flow below pharyngoesophageal segment (PES) Pill: Pill Consistency administered: Mildly thick liquids (Level 2, nectar thick) Mildly thick liquids (Level 2, nectar thick): WFL Penetration/Aspiration Scale Score: Penetration/Aspiration Scale Score 1.  Material does not enter airway: Moderately thick liquids (Level 3, honey thick); Puree; Solid; Pill 2.  Material enters airway, remains ABOVE vocal cords then ejected out: Mildly thick liquids (Level 2, nectar  thick) 8.  Material enters airway, passes BELOW cords without attempt by patient to eject out (silent aspiration) : Thin liquids (Level 0) Compensatory Strategies: Compensatory Strategies Compensatory strategies: Yes Straw: Effective Effective Straw: Mildly thick liquid (Level 2, nectar thick) Chin tuck: Ineffective Ineffective Chin Tuck: Thin liquid (Level 0)   General Information: Caregiver present: No  Diet Prior to this Study: NPO; Cortrak/Small bore NG tube   Temperature : Normal   Respiratory Status: WFL   Supplemental O2: None (Room air)   History of Recent Intubation: Yes  Behavior/Cognition: Alert; Cooperative; Pleasant mood; Requires cueing Self-Feeding Abilities: Able to self-feed Baseline vocal quality/speech: Hypophonia/low volume Volitional Cough: Able to elicit Volitional Swallow: Able to elicit Exam Limitations: No limitations Goal Planning: Prognosis for improved oropharyngeal function: Good Barriers to Reach Goals: Cognitive deficits No data recorded Patient/Family Stated Goal: wants water Consulted and agree with results and recommendations: Patient Pain: Pain Assessment Pain Assessment: Faces Faces Pain Scale: 0 End of Session: Start Time:SLP Start Time (ACUTE ONLY): 0955 Stop Time: SLP Stop Time (ACUTE ONLY): 1012 Time Calculation:SLP Time Calculation (min) (ACUTE ONLY): 17 min Charges: SLP Evaluations $ SLP Speech Visit: 1 Visit SLP Evaluations $BSS Swallow: 1 Procedure $MBS Swallow: 1 Procedure SLP visit diagnosis: SLP Visit Diagnosis: Dysphagia, oropharyngeal phase (R13.12) Past Medical History: Past Medical History: Diagnosis Date  Anxiety   Arthritis   Cancer (HCC)   squamous cell CA removed from skin  Depression   Diabetes mellitus without complication (HCC)   GERD (gastroesophageal reflux disease)   rare-no meds  Headache   h/o migraines  Hepatitis   Hep C-pt had injections and pill and pt states he does not have hep c anymore  History of kidney stones   h/o age 61  Hypertension   Mental  disorder   Pancreatitis 2012  Personal history of tobacco use, presenting hazards to health 11/07/2015 Past Surgical History: Past Surgical History: Procedure Laterality Date  CERVICAL SPINE SURGERY    CIRCUMCISION    COLONOSCOPY    EYE SURGERY    bilateral cataract removal; implants  LUMBAR LAMINECTOMY/DECOMPRESSION MICRODISCECTOMY  10/07/2012  Procedure: LUMBAR LAMINECTOMY/DECOMPRESSION MICRODISCECTOMY 2 LEVELS;  Surgeon: Tia Alert, MD;  Location: [facility];  Service: Neurosurgery;  Laterality: Left;  Left Lumbar two-three,lumbar four-five hemilaminectomy  PILONIDAL CYST EXCISION    SPINAL CORD STIMULATOR IMPLANT  2018  [employer]  TONSILLECTOMY    TOTAL SHOULDER ARTHROPLASTY Right 10/13/2019  Procedure: TOTAL SHOULDER ARTHROPLASTY;  Surgeon: Lyndle Herrlich, MD;  Location: [facility] ORS;  Service: Orthopedics;  Laterality: Right; Mahala Menghini., M.A. CCC-SLP Acute Rehabilitation Services Office [phone] Secure chat preferred 01/27/2024, 11:44 AM  DG CHEST PORT 1 VIEW Result Date: 01/24/2024 CLINICAL DATA:  Fever. EXAM: PORTABLE CHEST 1 VIEW COMPARISON:  Radiograph yesterday FINDINGS: Interval extubation. Weighted enteric tube tip below the diaphragm, not included in the field of view. Persistent left lung base opacity with mild improvement. Minimal right infrahilar opacity. Stable cardiomegaly, unchanged mediastinal contours. No pleural effusion or pneumothorax. Spinal stimulator in place. IMPRESSION: 1. Interval extubation. 2. Persistent left lung base opacity with mild improvement. Minimal right infrahilar opacity. 3. Stable cardiomegaly. Electronically Signed   By: Narda Rutherford M.D.   On: 01/24/2024 18:27   CT HEAD WO CONTRAST ( ) Result Date: 01/24/2024 CLINICAL DATA:  Stroke follow-up EXAM: CT HEAD WITHOUT CONTRAST TECHNIQUE: Contiguous axial images were obtained from the base of the skull through the vertex without intravenous contrast. RADIATION DOSE REDUCTION: This exam was performed  according to the departmental dose-optimization program which includes automated exposure control, adjustment of the mA and/or kV according to patient size and/or use of iterative reconstruction technique. COMPARISON:  Yesterday FINDINGS: Brain: Right thalamic hematoma with intraventricular to subarachnoid extension is non progressed. On axial images the thalamic hematoma measures 2.3 x 1.5 cm. Mild lateral ventriculomegaly is stable, no ventricular ballooning. Chronic small vessel ischemia in the cerebral white matter. Cerebral volume loss that is generalized. Vascular: No hyperdense vessel or unexpected calcification. Skull: Normal. Negative for fracture or focal lesion. Sinuses/Orbits: No acute finding. IMPRESSION: Unchanged right thalamic hematoma with intraventricular to subarachnoid extension. Stable ventricular volume. Electronically Signed   By: Tiburcio Pea M.D.   On: 01/24/2024 07:56   CT HEAD WO CONTRAST ( ) Result Date: 01/23/2024 CLINICAL DATA:  Follow-up of intracranial hemorrhage. EXAM: CT HEAD WITHOUT CONTRAST TECHNIQUE: Contiguous axial images were obtained from the base of the skull through the vertex without intravenous contrast. RADIATION DOSE REDUCTION: This exam was performed according to the departmental dose-optimization program which includes automated exposure control, adjustment of the mA and/or kV according to patient size and/or use of iterative reconstruction technique. COMPARISON:  Earlier same day CT head FINDINGS: Brain: Redemonstrated focus of intraparenchymal hemorrhage in the right thalamus which now measures 1.6 x 2.5 x 3.2 cm (AP X TR x CC), previously 2.3  x 2.6 x 3.0 cm when remeasured in a similar manner. Subtle increase in edema surrounding the hematoma. There is similar decompression of hemorrhage into the ventricular system with slightly increased blood products within the body of the right lateral ventricle. Increased blood products within the atria and occipital  horns of the lateral ventricles. Additional areas of subarachnoid hemorrhage over the left parietal convexity likely reflecting redistribution of blood products. No midline shift. The basilar cisterns are patent. Vascular: No hyperdense vessel. Skull: Normal. Negative for fracture or focal lesion. Sinuses/Orbits: Mucosal thickening in the sphenoid and ethmoid sinuses. No air-fluid levels. Orbits are symmetric. Other: Mastoid air cells are clear. IMPRESSION: Overall similar appearance of parenchymal hematoma in the right thalamus accounting for differences in positioning. No midline shift. Slightly increased blood products within the lateral ventricles. Similar mild hydrocephalus. New small focus of subarachnoid hemorrhage over the left parietal convexity likely reflecting redistribution of blood products. Electronically Signed   By: Emily Filbert M.D.   On: 01/23/2024 16:37   ECHOCARDIOGRAM COMPLETE Result Date: 01/23/2024    ECHOCARDIOGRAM REPORT   Patient Name:   TYMERE DEPUY Date of Exam: 01/23/2024 Medical Rec #:  [id number]    Height:       67.0 in Accession #:    [id number]   Weight:       196.4 lb Date of Birth:  May 05, 1946    BSA:          2.007 m Patient Age:    77 years     BP:           95/60 mmHg Patient Gender: M            HR:           53 bpm. Exam Location:  Inpatient Procedure: 2D Echo, Color Doppler, Cardiac Doppler and Intracardiac            Opacification Agent (Both Spectral and Color Flow Doppler were            utilized during procedure). Indications:    Stroke i63.9  History:        Patient has no prior history of Echocardiogram examinations.                 Risk Factors:Hypertension and Diabetes.  Sonographer:    Irving Burton Senior RDCS Referring Phys: [id number] [facility]  Sonographer Comments: Technically difficult study, patient is supine on artificial respirator. IMPRESSIONS  1. Left ventricular ejection fraction, by estimation, is 70 to 75%. Left ventricular ejection fraction by PLAX is  72 %. The left ventricle has hyperdynamic function. The left ventricle has no regional wall motion abnormalities. There is moderate concentric left ventricular hypertrophy. Left ventricular diastolic parameters are consistent with Grade I diastolic dysfunction (impaired relaxation).  2. Right ventricular systolic function is normal. The right ventricular size is normal. Tricuspid regurgitation signal is inadequate for assessing PA pressure.  3. Left atrial size was moderately dilated.  4. The mitral valve is normal in structure. Trivial mitral valve regurgitation.  5. The aortic valve is tricuspid. Aortic valve regurgitation is not visualized. FINDINGS  Left Ventricle: Left ventricular ejection fraction, by estimation, is 70 to 75%. Left ventricular ejection fraction by PLAX is 72 %. The left ventricle has hyperdynamic function. The left ventricle has no regional wall motion abnormalities. Definity contrast agent was given IV to delineate the left ventricular endocardial borders. Strain imaging was not performed. The left ventricular internal cavity size was normal in size. There  is moderate concentric left ventricular hypertrophy. Left ventricular  diastolic parameters are consistent with Grade I diastolic dysfunction (impaired relaxation). Indeterminate filling pressures. Right Ventricle: The right ventricular size is normal. No increase in right ventricular wall thickness. Right ventricular systolic function is normal. Tricuspid regurgitation signal is inadequate for assessing PA pressure. Left Atrium: Left atrial size was moderately dilated. Right Atrium: Right atrial size was not well visualized. Pericardium: Trivial pericardial effusion is present. Mitral Valve: The mitral valve is normal in structure. Trivial mitral valve regurgitation. Tricuspid Valve: The tricuspid valve is normal in structure. Tricuspid valve regurgitation is trivial. Aortic Valve: The aortic valve is tricuspid. Aortic valve regurgitation  is not visualized. Pulmonic Valve: The pulmonic valve was normal in structure. Pulmonic valve regurgitation is trivial. Aorta: The aortic root and ascending aorta are structurally normal, with no evidence of dilitation. Venous: IVC assessment for right atrial pressure unable to be performed due to mechanical ventilation. IAS/Shunts: The interatrial septum was not well visualized. Additional Comments: 3D imaging was not performed.  LEFT VENTRICLE PLAX 2D LV EF:         Left            Diastology                ventricular     LV e' medial:    4.20 cm/s                ejection        LV E/e' medial:  12.9                fraction by     LV e' lateral:   4.20 cm/s                PLAX is 72      LV E/e' lateral: 12.9                %. LVIDd:         3.70 cm LVIDs:         2.20 cm LV PW:         1.30 cm LV IVS:        1.30 cm LVOT diam:     2.00 cm LV SV:         54 LV SV Index:   27 LVOT Area:     3.14 cm  RIGHT VENTRICLE RV S prime:     17.90 cm/s TAPSE (M-mode): 1.9 cm LEFT ATRIUM           Index LA diam:      3.60 cm 1.79 cm/m LA Vol (A4C): 71.3 ml 35.53 ml/m  AORTIC VALVE LVOT Vmax:   101.00 cm/s LVOT Vmean:  73.700 cm/s LVOT VTI:    0.173 m  AORTA Ao Root diam: 2.90 cm Ao Asc diam:  3.20 cm MITRAL VALVE MV Area (PHT): 2.42 cm    SHUNTS MV Decel Time: 314 msec    Systemic VTI:  0.17 m MV E velocity: 54.10 cm/s  Systemic Diam: 2.00 cm MV A velocity: 71.00 cm/s MV E/A ratio:  0.76 Chilton Si MD Electronically signed by Chilton Si MD Signature Date/Time: 01/23/2024/2:30:47 PM    Final    DG CHEST PORT 1 VIEW Result Date: 01/23/2024 CLINICAL DATA:  Acute hypoxic respiratory failure. EXAM: PORTABLE CHEST 1 VIEW COMPARISON:  01/23/2024 at 0018 hours FINDINGS: Endotracheal tube is 2.1 cm above the carina. Nasogastric tube extends into the abdomen. Spinal neurostimulator device is present.  Right shoulder arthroplasty. Slightly increased densities at the left lung base may represent atelectasis. Heart size  is within normal limits. Negative for a pneumothorax. IMPRESSION: 1. Endotracheal tube is in good position. 2. Slightly increased densities at the left lung base. Findings could represent atelectasis. Electronically Signed   By: Richarda Overlie M.D.   On: 01/23/2024 09:45   DG Abd 1 View Result Date: 01/23/2024 CLINICAL DATA:  Acute hypoxic respiratory failure. Nasogastric tube placement. EXAM: ABDOMEN - 1 VIEW COMPARISON:  CT abdomen pelvis 06/26/2020 FINDINGS: Nasogastric tube in left upper abdomen. Nasogastric tube tip is probably near the gastric fundus or body. There is a spinal neurostimulator device. Postoperative changes in the lower lumbar spine. Gas-filled loops of bowel in the abdomen with a nonobstructive pattern. IMPRESSION: Nasogastric tube tip is probably in the gastric fundus or body. Electronically Signed   By: Richarda Overlie M.D.   On: 01/23/2024 09:44   [place] EKG SITE RITE Result Date: 01/23/2024 If Site Rite image not attached, placement could not be confirmed due to current cardiac rhythm.  DG CHEST PORT 1 VIEW Result Date: 01/23/2024 CLINICAL DATA:  Shortness of breath EXAM: PORTABLE CHEST 1 VIEW COMPARISON:  10/06/2012 FINDINGS: Stable cardiomediastinal silhouette. Aortic atherosclerotic calcification. Reticulonodular opacities in the lower lungs. No focal consolidation, pleural effusion, or pneumothorax. No displaced rib fractures. Spinal stimulator with tip projecting at T7. IMPRESSION: Reticulonodular opacities in the lower lungs suggestive of airway infection/inflammation. Electronically Signed   By: Minerva Fester M.D.   On: 01/23/2024 03:31   CT HEAD WO CONTRAST ( ) Result Date: 01/23/2024 CLINICAL DATA:  Stroke, hemorrhagic EXAM: CT HEAD WITHOUT CONTRAST TECHNIQUE: Contiguous axial images were obtained from the base of the skull through the vertex without intravenous contrast. RADIATION DOSE REDUCTION: This exam was performed according to the departmental dose-optimization program  which includes automated exposure control, adjustment of the mA and/or kV according to patient size and/or use of iterative reconstruction technique. COMPARISON:  CT head February 13, 25. FINDINGS: Brain: Mild interval increase in size of the intraparenchymal hemorrhage in the right thalamus which now measures approximately 2.3 x 2.7 cm. Interval increase in intraventricular hemorrhage layering in the occipital horns. Interval development of mild hydrocephalus with rounding of the temporal horns and outwardly convex third ventricular walls. No evidence of acute large vascular territory infarct. No midline shift. Vascular: No hyperdense vessel. Skull: No acute fracture. Sinuses/Orbits: Clear sinuses.  No acute orbital findings. Other: No mastoid effusions IMPRESSION: 1. Mild interval increase in size of the intraparenchymal hemorrhage in the right thalamus. 2. Interval increase in intraventricular hemorrhage. 3. Interval development of mild hydrocephalus. These results will be called to the ordering clinician or representative by the Radiologist Assistant, and communication documented in the PACS or [employer]. Electronically Signed   By: Feliberto Harts M.D.   On: 01/23/2024 01:38   CT HEAD WO CONTRAST Result Date: 01/22/2024 CLINICAL DATA:  Stroke, follow up EXAM: CT HEAD WITHOUT CONTRAST TECHNIQUE: Contiguous axial images were obtained from the base of the skull through the vertex without intravenous contrast. RADIATION DOSE REDUCTION: This exam was performed according to the departmental dose-optimization program which includes automated exposure control, adjustment of the mA and/or kV according to patient size and/or use of iterative reconstruction technique. COMPARISON:  CT head January 22, 2024. FINDINGS: Brain: No substantial change in intraparenchymal hemorrhage in the right thalamus. Slight increase in intraventricular extension. No evidence of acute large vascular territory infarct or  significant midline shift. No hydrocephalus. Vascular:  No hyperdense vessel. Skull: No acute fracture. Sinuses/Orbits: Clear sinuses.  No acute orbital findings. Other: No mastoid effusions. IMPRESSION: No substantial change in intraparenchymal hemorrhage in the right thalamus. Slight increase in intraventricular hemorrhage. Electronically Signed   By: Feliberto Harts M.D.   On: 01/22/2024 20:14   CT ANGIO HEAD NECK W WO CM Addendum Date: 01/22/2024 ADDENDUM REPORT: 01/22/2024 09:09 ADDENDUM: No emergent large vessel occlusion identified. This result and CTA head impressions #3 and #4 called by telephone at the time of interpretation on 01/22/2024 at 9:05 am to provider NEHA RAY , who verbally acknowledged these results. Electronically Signed   By: Jackey Loge D.O.   On: 01/22/2024 09:09   Result Date: 01/22/2024 CLINICAL DATA:  Provided history: Neuro deficit, acute, stroke suspected. Right-sided intraparenchymal hemorrhage. EXAM: CT ANGIOGRAPHY HEAD AND NECK WITH AND WITHOUT CONTRAST TECHNIQUE: Multidetector CT imaging of the head and neck was performed using the standard protocol during bolus administration of intravenous contrast. Multiplanar CT image reconstructions and MIPs were obtained to evaluate the vascular anatomy. Carotid stenosis measurements (when applicable) are obtained utilizing NASCET criteria, using the distal internal carotid diameter as the denominator. RADIATION DOSE REDUCTION: This exam was performed according to the departmental dose-optimization program which includes automated exposure control, adjustment of the mA and/or kV according to patient size and/or use of iterative reconstruction technique. CONTRAST:  75mL OMNIPAQUE IOHEXOL 350 MG/ML SOLN COMPARISON:  Noncontrast head CT performed earlier today 01/22/2024. FINDINGS: CTA NECK FINDINGS Aortic arch: Common origin of the innominate and left common carotid arteries. Atherosclerotic plaque within the visualized thoracic aorta and  proximal major branch vessels of the neck. Streak/beam hardening artifact arising from a dense contrast bolus partially obscures the left subclavian artery. Within this limitation, there is no appreciable hemodynamically significant innominate or proximal subclavian artery stenosis. Right carotid system: CCA and ICA patent within the neck. Prominent atherosclerotic plaque about the carotid bifurcation and within the proximal ICA with resultant severe (greater than 90%) at the ICA origins/proximal ICA. Atherosclerotic plaque within the distal cervical ICA with 50% stenosis at this site. Left carotid system: CCA and ICA patent within the neck. Atherosclerotic plaque about the carotid bifurcation and within the proximal ICA. Resultant severe (greater than 90% stenosis at the ICA origins/proximal ICA. Atherosclerotic plaque within the distal cervical ICA resulting in less than 50% stenosis. Vertebral arteries: Patent within the neck without stenosis. Nonstenotic atherosclerotic plaque at the left vertebral artery origin. The left vertebral artery is dominant. Skeleton: Spondylosis of the cervical and visualized upper thoracic levels. Other neck: No neck mass or cervical lymphadenopathy. Upper chest: No consolidation within the imaged lung apices. Review of the MIP images confirms the above findings CTA HEAD FINDINGS Anterior circulation: The intracranial internal carotid arteries are patent. Atherosclerotic plaque within both vessels. Most notably, there are sites of up to moderate stenosis within the right ICA cavernous segment and within the paraclinoid left ICA. The M1 middle cerebral arteries are patent. No M2 proximal branch occlusion or high-grade proximal stenosis. The anterior cerebral arteries are patent. No intracranial aneurysm is identified. Posterior circulation: The intracranial vertebral arteries are patent. Atherosclerotic plaque within the left V4 segment with no more than mild stenosis. The basilar  artery is patent. The posterior cerebral arteries are patent. Atherosclerotic irregularity of both vessels. Most notably, there are severe, near-occlusive stenoses within the right PCA P2 and P3 segments, and a severe near-occlusive stenosis within the left PCA P3 segment. Posterior communicating arteries are present bilaterally. Venous sinuses:  Within the limitations of contrast timing, no convincing thrombus. Anatomic variants: As described. Review of the MIP images confirms the above findings Attempts are being made to reach the ordering provider at this time. IMPRESSION: CTA neck: 1. The common carotid and internal carotid arteries are patent within the neck. Atherosclerotic plaque bilaterally. Most notably, there are severe (greater than 90%) stenoses at the bilateral internal carotid artery origins/proximal internal carotid arteries. 2. The vertebral arteries are patent within the neck without stenosis. Nonstenotic plaque at the left vertebral artery origin. 3.  Aortic Atherosclerosis (ICD10-I70.0). CTA head: 1. No evidence of an aneurysm or AVM in the region of the acute parenchymal hemorrhage. 2. Intracranial atherosclerotic disease with multifocal stenoses, most notably as follows. 3. Severe near occlusive stenoses within the right PCA P2 and P3 segments. 4. Severe near occlusive stenosis within the left PCA P3 segment. 5. Up to moderate stenosis within the right ICA cavernous segment and paraclinoid left ICA. Electronically Signed: By: Jackey Loge D.O. On: 01/22/2024 08:57   CT HEAD CODE STROKE WO CONTRAST Addendum Date: 01/22/2024 ADDENDUM REPORT: 01/22/2024 08:36 ADDENDUM: These results were called by telephone at the time of interpretation on 01/22/2024 at 8:36 am to provider NEHA RAY , who verbally acknowledged these results. Electronically Signed   By: Jackey Loge D.O.   On: 01/22/2024 08:36   Result Date: 01/22/2024 CLINICAL DATA:  Code stroke. Provided history: Neuro deficit, acute, stroke  suspected. EXAM: CT HEAD WITHOUT CONTRAST TECHNIQUE: Contiguous axial images were obtained from the base of the skull through the vertex without intravenous contrast. RADIATION DOSE REDUCTION: This exam was performed according to the departmental dose-optimization program which includes automated exposure control, adjustment of the mA and/or kV according to patient size and/or use of iterative reconstruction technique. COMPARISON:  Noncontrast head CT 01/01/2012. FINDINGS: Brain: Generalized cerebral atrophy. 2.1 x 2.1 x 3.1 cm (7 mL) acute parenchymal hemorrhage centered at the right thalamocapsular junction with mild surrounding edema. Moderate volume intraventricular extension of hemorrhage into the right greater than left lateral ventricles, third ventricle and fourth ventricle. No evidence of hydrocephalus. Background mild patchy and ill-defined hypoattenuation within the cerebral white matter, nonspecific but compatible with chronic small vessel disease. No demarcated cortical infarct. No extra-axial fluid collection. No evidence of an intracranial mass. No midline shift. Vascular: No hyperdense vessel.  Atherosclerotic calcifications. Skull: No calvarial fracture or aggressive osseous lesion. Sinuses/Orbits: No mass or acute finding within the imaged orbits. Minimal mucosal thickening within the bilateral maxillary, bilateral sphenoid and left ethmoid sinuses Attempts are being made to reach the ordering provider at this time. IMPRESSION: 1. 2.1 x 2.1 x 3.1 cm (7 mL) acute parenchymal hemorrhage centered at the right thalamocapsular junction with mild surrounding edema. Moderate volume intraventricular extension of hemorrhage into the right greater than left lateral ventricles, third ventricle and fourth ventricle. No evidence of hydrocephalus. 2. Background parenchymal atrophy and chronic small vessel ischemic disease. Electronically Signed: By: Jackey Loge D.O. On: 01/22/2024 08:31    Microbiology: Results for orders placed or performed during the hospital encounter of 01/22/24  MRSA Next Gen by PCR, Nasal     Status: None   Collection Time: 01/22/24  1:07 PM   Specimen: Nasal Mucosa; Nasal Swab  Result Value Ref Range Status   MRSA by PCR Next Gen NOT DETECTED NOT DETECTED Final    Comment: (NOTE) The GeneXpert MRSA Assay (FDA approved for NASAL specimens only), is one component of a comprehensive MRSA colonization surveillance program. It  is not intended to diagnose MRSA infection nor to guide or monitor treatment for MRSA infections. Test performance is not FDA approved in patients less than 27 years old. Performed at [facility], [address] [place], [place] [address]   Culture, blood (Routine X 2) w Reflex to ID Panel     Status: None   Collection Time: 01/23/24  5:39 AM   Specimen: BLOOD  Result Value Ref Range Status   Specimen Description BLOOD LEFT ANTECUBITAL  Final   Special Requests   Final    BOTTLES DRAWN AEROBIC AND ANAEROBIC Blood Culture adequate volume   Culture   Final    NO GROWTH 5 DAYS Performed at [facility], [address] [place], [place] [address]    Report Status 01/28/2024 FINAL  Final  Culture, blood (Routine X 2) w Reflex to ID Panel     Status: None   Collection Time: 01/23/24  8:54 AM   Specimen: BLOOD  Result Value Ref Range Status   Specimen Description BLOOD BLOOD RIGHT HAND  Final   Special Requests   Final    BOTTLES DRAWN AEROBIC AND ANAEROBIC Blood Culture results may not be optimal due to an inadequate volume of blood received in culture bottles   Culture   Final    NO GROWTH 5 DAYS Performed at [facility], [address] [place], [place] [address]    Report Status 01/28/2024 FINAL  Final  Culture, Respiratory w Gram Stain     Status: None   Collection Time: 01/24/24  4:52 AM   Specimen: Tracheal Aspirate; Respiratory  Result Value Ref Range Status   Specimen Description TRACHEAL  ASPIRATE  Final   Special Requests NONE  Final   Gram Stain   Final    FEW WBC PRESENT,BOTH PMN AND MONONUCLEAR NO ORGANISMS SEEN    Culture   Final    NO GROWTH 2 DAYS Performed at [facility], [address], [place], [place] [address]    Report Status 01/26/2024 FINAL  Final   Labs: CBC: Recent Labs  Lab 01/27/24 0558 01/28/24 0532 01/29/24 0633 01/30/24 0559 01/30/24 1203  WBC 8.0 9.9 11.8* 14.1* 13.5*  NEUTROABS  --   --  8.9* 10.7* 10.7*  HGB 13.7 13.3 12.9* 13.4 12.6*  HCT 41.6 41.9 38.5* 40.3 37.6*  MCV 92.2 93.5 89.7 90.6 90.0  PLT 154 179 163 194 196   Basic Metabolic Panel: Recent Labs  Lab 01/23/24 1746 01/24/24 0523 01/24/24 1705 01/25/24 0417 01/26/24 0438 01/27/24 0557 01/27/24 0558 01/28/24 0532 01/29/24 0633 01/30/24 0559  NA  --  135  --    < > 143  --  143 141 137 136  K  --  4.1  --    < > 4.1  --  4.0 4.7 4.0 4.2  CL  --  104  --    < > 108  --  110 110 106 101  CO2  --  22  --    < > 24  --  21* 20* 21* 26  GLUCOSE  --  222*  --    < > 241*  --  203* 270* 201* 98  BUN  --  32*  --    < > 31*  --  37* 40* 30* 24*  CREATININE  --  1.56*  --    < > 1.02  --  1.03 1.13 1.06 0.99  CALCIUM  --  8.2*  --    < > 9.4  --  9.0 8.7* 8.7* 8.7*  MG 2.4 2.2 2.1  --   --   --   --   --  2.0 2.1  PHOS 3.8 3.0 2.4*  --   --  2.8  --   --  3.3 4.4   < > = values in this interval not displayed.   Liver Function Tests: Recent Labs  Lab 01/29/24 0633 01/30/24 0559  AST 17 28  ALT 28 39  ALKPHOS 62 61  BILITOT 0.5 0.6  PROT 5.9* 6.3*  ALBUMIN 2.8* 2.9*   CBG: Recent Labs  Lab 01/29/24 1239 01/29/24 1526 01/29/24 2105 01/30/24 0643 01/30/24 1215  GLUCAP 319* 256* 170* 93 214*   Discharge time spent: greater than 30 minutes.  Signed: Marguerita Merles, DO [facility] 01/30/2024

## 2024-01-29 NOTE — Inpatient Diabetes Management (Signed)
Inpatient Diabetes Program Recommendations  AACE/ADA: New Consensus Statement on Inpatient Glycemic Control (2015)  Target Ranges:  Prepandial:   less than 140 mg/dL      Peak postprandial:   less than 180 mg/dL (1-2 hours)      Critically ill patients:  140 - 180 mg/dL   Lab Results  Component Value Date   GLUCAP 235 (H) 01/29/2024   HGBA1C 6.6 (H) 01/23/2024    Review of Glycemic Control  Latest Reference Range & Units 01/29/24 00:07 01/29/24 00:29 01/29/24 03:49 01/29/24 04:40  Glucose-Capillary 70 - 99 mg/dL 161 (H) 096 (H) 045 (H) 235 (H)  (H): Data is abnormally high Diabetes history: Type 2 Dm Outpatient Diabetes medications: Metformin 1000 mg BID Current orders for Inpatient glycemic control: Semglee 25 units BID, Novolog 12 units Q4H, Novolog 0-15 units Q4H Osmolite 80 ml/hr  Inpatient Diabetes Program Recommendations:    Consider increasing Novolog 18 units Q4H for tube feed coverage (to be stopped or held in the event tube feeds stopped)  Thanks, Lujean Rave, MSN, RNC-OB Diabetes Coordinator [phone] (8a-5p)

## 2024-01-29 NOTE — TOC Progression Note (Signed)
Transition of Care [facility]) - Progression Note    Patient Details  Name: Herbert Chapman MRN: [id number] Date of Birth: 01/18/1946  Transition [facility] CM/SW Contact  Baldemar [place], [place] Phone Number: 01/29/2024, 3:31 PM  Clinical Narrative:   CSW spoke with patient's significant other, [name], to provide update on SNF offers. [name] wants to accept bed offer at [facility], that is the closest to home. CSW answered questions. CSW contacted [facility] to confirm bed availability, and requested CMA to initiate insurance authorization. CSW to follow.    Expected Discharge Plan: Skilled Nursing Facility Barriers to Discharge: Continued Medical Work up, English as a second language teacher  Expected Discharge Plan and Services In-house Referral: Clinical Social Work     Living arrangements for the past 2 months: Mobile Home                                       Social Determinants of Health (SDOH) Interventions SDOH Screenings   Food Insecurity: No Food Insecurity (01/22/2024)  Housing: Unknown (01/22/2024)  Transportation Needs: No Transportation Needs (01/22/2024)  Utilities: Not At Risk (01/22/2024)  Social Connections: Socially Integrated (01/22/2024)  Tobacco Use: Medium Risk (01/22/2024)    Readmission Risk Interventions     No data to display

## 2024-01-29 NOTE — Progress Notes (Signed)
Physical Therapy Treatment Patient Details Name: Herbert Chapman MRN: [id number] DOB: Oct 19, 1946 Today's Date: 01/29/2024   History of Present Illness 78 y.o. male presents to [facility] hospital on 01/22/2024 with L weakness. 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 Pt intubated on 2/14-2/15. PMH includes HTN, DMII, pancreatitis, hepatitis C, GERD, migraines, anxiety, depression.    PT Comments  Pt resting in bed on arrival and agreeable to session with continued progress towards acute goals. Pt continues to be limited by L hemi-weakness, L inattention, decreased activity tolerance and impaired balance/postural reactions. Pt requiring mod A to complete bed mobility with increased cues for midline posture as pt with R lateral and posterior lean initially. Pt able to boost to stand in stedy frame with min A and requiring up to mod A to maintain balance as pt with L lateral bias. Pt able to correct with verbal and tactile cues and visual feedback from mirror. Pt up in chair at end of session and positioned to midline. Current plan remains appropriate to address deficits and maximize functional independence and decrease caregiver burden. Pt continues to benefit from skilled PT services to progress toward functional mobility goals.     If plan is discharge home, recommend the following:     Can travel by private vehicle        Equipment Recommendations  Other (comment) (TBD)    Recommendations for Other Services       Precautions / Restrictions Precautions Precautions: Fall;Other (comment) Precaution/Restrictions Comments: L hemi, coretrak Restrictions Weight Bearing Restrictions Per Provider Order: No     Mobility  Bed Mobility Overal bed mobility: Needs Assistance Bed Mobility: Supine to Sit     Supine to sit: Mod assist     General bed mobility comments: assist with LLE and for trunk. A for bringing R hip out towrd EOB    Transfers Overall transfer level: Needs assistance Equipment used:  Ambulation equipment used Transfers: Sit to/from Stand, Bed to chair/wheelchair/BSC Sit to Stand: Min assist           General transfer comment: min A up with stedy and mod cues for midline position/avoidance of L lateral lean with visual feedback from mirror. Min A to manage L arm throughout due to poor grip strength Transfer via Lift Equipment: Stedy  Ambulation/Gait               General Gait Details: unable to safely progress   Stairs             Wheelchair Mobility     Tilt Bed    Modified Rankin (Stroke Patients Only) Modified Rankin (Stroke Patients Only) Pre-Morbid Rankin Score: No symptoms Modified Rankin: Severe disability     Balance Overall balance assessment: Needs assistance Sitting-balance support: Single extremity supported, Feet supported Sitting balance-Leahy Scale: Poor Sitting balance - Comments: L lean, able to correct with min verbal/tactile cues Postural control: Left lateral lean Standing balance support: Bilateral upper extremity supported, During functional activity Standing balance-Leahy Scale: Poor Standing balance comment: min-mod A to mainatin static standing balance as well as cues to midline                            Communication Communication Communication: Impaired Factors Affecting Communication: Difficulty expressing self  Cognition Arousal: Alert Behavior During Therapy: WFL for tasks assessed/performed   PT - Cognitive impairments: Awareness, Problem solving, Safety/Judgement, Sequencing, Memory  Following commands: Intact (for 1-2 step commands)      Cueing Cueing Techniques: Verbal cues, Tactile cues  Exercises      General Comments General comments (skin integrity, edema, etc.): VSS, pt with bowel incontinence at start of session, RN in to assist with peri-care      Pertinent Vitals/Pain Pain Assessment Pain Assessment: Faces Faces Pain Scale: Hurts  little more Pain Location: bottom with peri-care Pain Descriptors / Indicators: Grimacing, Guarding Pain Intervention(s): Monitored during session, Limited activity within patient's tolerance, Repositioned    Home Living       Type of Home: Mobile home                  Prior Function            PT Goals (current goals can now be found in the care plan section) Acute Rehab PT Goals Patient Stated Goal: to walk PT Goal Formulation: With patient Time For Goal Achievement: 02/08/24 Progress towards PT goals: Progressing toward goals    Frequency    Min 1X/week      PT Plan      Co-evaluation              AM-PAC PT "6 Clicks" Mobility   Outcome Measure  Help needed turning from your back to your side while in a flat bed without using bedrails?: A Lot Help needed moving from lying on your back to sitting on the side of a flat bed without using bedrails?: A Lot Help needed moving to and from a bed to a chair (including a wheelchair)?: Total Help needed standing up from a chair using your arms (e.g., wheelchair or bedside chair)?: Total Help needed to walk in hospital room?: Total Help needed climbing 3-5 steps with a railing? : Total 6 Click Score: 8    End of Session   Activity Tolerance: Patient tolerated treatment well Patient left: with call bell/phone within reach;in chair;with chair alarm set;with nursing/sitter in room Nurse Communication: Mobility status PT Visit Diagnosis: Other abnormalities of gait and mobility (R26.89);Other symptoms and signs involving the nervous system (R29.898)     Time: 7829-5621 PT Time Calculation (min) (ACUTE ONLY): 34 min  Charges:    $Gait Training: 8-22 mins $Therapeutic Activity: 8-22 mins PT General Charges $$ ACUTE PT VISIT: 1 Visit                     Yannely Kintzel R. PTA Acute Rehabilitation Services Office: [phone]   Catalina Antigua 01/29/2024, 12:38 PM

## 2024-01-29 NOTE — Progress Notes (Signed)
Speech Language Pathology Treatment: Dysphagia  Patient Details Name: Herbert Chapman MRN: [id number] DOB: 1946/12/01 Today's Date: 01/29/2024 Time: 4098-1191 SLP Time Calculation (min) (ACUTE ONLY): 9 min  Assessment / Plan / Recommendation Clinical Impression  Pt observed with po's after MBS 2/18 and reviewed clinical reasoning for thickener. Pt noted to cough prior to po's and one instance of delayed cough throughout observation of nectar thick juice and graham cracker. There was mild residue with cracker on left side of anterior oral cavity that he was not sensate to. SLP advised pt to use lingual sweep to check for residue during meals and snacks. Recommend he continue nectar thick liquids and Dys 3 texture, crushed pills in applesauce. Pt's disposition is now to a SNF and depending on when he is discharged he may or may not have a repeat MBS for liquid upgrade prior to discharge. If he is discharged soon, pt can work with SLP at [facility] and return for outpatient MBS.    HPI HPI: 78 y.o. male presents to [facility] hospital on 01/22/2024 with L weakness. 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 Pt intubated on 2/14; extubated 2/15. PMH includes HTN, DMII, pancreatitis, hepatitis C, GERD, migraines, anxiety, depression, cervical spine surgery.      SLP Plan  Continue with current plan of care      Recommendations for follow up therapy are one component of a multi-disciplinary discharge planning process, led by the attending physician.  Recommendations may be updated based on patient status, additional functional criteria and insurance authorization.    Recommendations  Diet recommendations: Dysphagia 3 (mechanical soft);Nectar-thick liquid Liquids provided via: Straw;Cup Medication Administration: Crushed with puree Supervision: Staff to assist with self feeding Compensations: Slow rate;Small sips/bites Postural Changes and/or Swallow Maneuvers: Seated upright 90 degrees                Rehab  consult Oral care BID   Frequent or constant Supervision/Assistance Dysphagia, oropharyngeal phase (R13.12)     Continue with current plan of care     Royce Macadamia  01/29/2024, 11:06 AM

## 2024-01-29 NOTE — Progress Notes (Signed)
Cortrac removed with no complications. Patient continues to eat greater than 75% of his meals.

## 2024-01-30 ENCOUNTER — Inpatient Hospital Stay (HOSPITAL_COMMUNITY): Payer: Medicare HMO

## 2024-01-30 DIAGNOSIS — R0989 Other specified symptoms and signs involving the circulatory and respiratory systems: Secondary | ICD-10-CM | POA: Diagnosis not present

## 2024-01-30 DIAGNOSIS — F32A Depression, unspecified: Secondary | ICD-10-CM | POA: Diagnosis not present

## 2024-01-30 DIAGNOSIS — J690 Pneumonitis due to inhalation of food and vomit: Secondary | ICD-10-CM | POA: Diagnosis not present

## 2024-01-30 DIAGNOSIS — G8929 Other chronic pain: Secondary | ICD-10-CM | POA: Diagnosis not present

## 2024-01-30 DIAGNOSIS — Z96611 Presence of right artificial shoulder joint: Secondary | ICD-10-CM | POA: Diagnosis not present

## 2024-01-30 DIAGNOSIS — M6281 Muscle weakness (generalized): Secondary | ICD-10-CM | POA: Diagnosis not present

## 2024-01-30 DIAGNOSIS — R531 Weakness: Secondary | ICD-10-CM | POA: Diagnosis not present

## 2024-01-30 DIAGNOSIS — I672 Cerebral atherosclerosis: Secondary | ICD-10-CM | POA: Diagnosis not present

## 2024-01-30 DIAGNOSIS — D72829 Elevated white blood cell count, unspecified: Secondary | ICD-10-CM | POA: Diagnosis not present

## 2024-01-30 DIAGNOSIS — I618 Other nontraumatic intracerebral hemorrhage: Secondary | ICD-10-CM | POA: Diagnosis not present

## 2024-01-30 DIAGNOSIS — I610 Nontraumatic intracerebral hemorrhage in hemisphere, subcortical: Secondary | ICD-10-CM | POA: Diagnosis not present

## 2024-01-30 DIAGNOSIS — I619 Nontraumatic intracerebral hemorrhage, unspecified: Secondary | ICD-10-CM | POA: Diagnosis not present

## 2024-01-30 DIAGNOSIS — I63531 Cerebral infarction due to unspecified occlusion or stenosis of right posterior cerebral artery: Secondary | ICD-10-CM | POA: Diagnosis not present

## 2024-01-30 DIAGNOSIS — E119 Type 2 diabetes mellitus without complications: Secondary | ICD-10-CM | POA: Diagnosis not present

## 2024-01-30 DIAGNOSIS — I6381 Other cerebral infarction due to occlusion or stenosis of small artery: Secondary | ICD-10-CM | POA: Diagnosis not present

## 2024-01-30 DIAGNOSIS — K219 Gastro-esophageal reflux disease without esophagitis: Secondary | ICD-10-CM | POA: Diagnosis not present

## 2024-01-30 DIAGNOSIS — I517 Cardiomegaly: Secondary | ICD-10-CM | POA: Diagnosis not present

## 2024-01-30 DIAGNOSIS — I615 Nontraumatic intracerebral hemorrhage, intraventricular: Secondary | ICD-10-CM | POA: Diagnosis not present

## 2024-01-30 DIAGNOSIS — I10 Essential (primary) hypertension: Secondary | ICD-10-CM | POA: Diagnosis not present

## 2024-01-30 DIAGNOSIS — N178 Other acute kidney failure: Secondary | ICD-10-CM | POA: Diagnosis not present

## 2024-01-30 DIAGNOSIS — Z7401 Bed confinement status: Secondary | ICD-10-CM | POA: Diagnosis not present

## 2024-01-30 DIAGNOSIS — I6523 Occlusion and stenosis of bilateral carotid arteries: Secondary | ICD-10-CM | POA: Diagnosis not present

## 2024-01-30 DIAGNOSIS — J9601 Acute respiratory failure with hypoxia: Secondary | ICD-10-CM | POA: Diagnosis not present

## 2024-01-30 DIAGNOSIS — I69354 Hemiplegia and hemiparesis following cerebral infarction affecting left non-dominant side: Secondary | ICD-10-CM | POA: Diagnosis not present

## 2024-01-30 LAB — CBC WITH DIFFERENTIAL/PLATELET
Abs Immature Granulocytes: 0.15 10*3/uL — ABNORMAL HIGH (ref 0.00–0.07)
Abs Immature Granulocytes: 0.17 10*3/uL — ABNORMAL HIGH (ref 0.00–0.07)
Basophils Absolute: 0.1 10*3/uL (ref 0.0–0.1)
Basophils Absolute: 0.1 10*3/uL (ref 0.0–0.1)
Basophils Relative: 0 %
Basophils Relative: 0 %
Eosinophils Absolute: 0.1 10*3/uL (ref 0.0–0.5)
Eosinophils Absolute: 0.2 10*3/uL (ref 0.0–0.5)
Eosinophils Relative: 1 %
Eosinophils Relative: 1 %
HCT: 40.3 % (ref 39.0–52.0)
HCT: 37.6 % — ABNORMAL LOW (ref 39.0–52.0)
Hemoglobin: 13.4 g/dL (ref 13.0–17.0)
Hemoglobin: 12.6 g/dL — ABNORMAL LOW (ref 13.0–17.0)
Immature Granulocytes: 1 %
Immature Granulocytes: 1 %
Lymphocytes Relative: 12 %
Lymphocytes Relative: 10 %
Lymphs Abs: 1.7 10*3/uL (ref 0.7–4.0)
Lymphs Abs: 1.3 10*3/uL (ref 0.7–4.0)
MCH: 30.1 pg (ref 26.0–34.0)
MCH: 30.1 pg (ref 26.0–34.0)
MCHC: 33.3 g/dL (ref 30.0–36.0)
MCHC: 33.5 g/dL (ref 30.0–36.0)
MCV: 90.6 fL (ref 80.0–100.0)
MCV: 90 fL (ref 80.0–100.0)
Monocytes Absolute: 1.4 10*3/uL — ABNORMAL HIGH (ref 0.1–1.0)
Monocytes Absolute: 1.1 10*3/uL — ABNORMAL HIGH (ref 0.1–1.0)
Monocytes Relative: 10 %
Monocytes Relative: 8 %
Neutro Abs: 10.7 10*3/uL — ABNORMAL HIGH (ref 1.7–7.7)
Neutro Abs: 10.7 10*3/uL — ABNORMAL HIGH (ref 1.7–7.7)
Neutrophils Relative %: 76 %
Neutrophils Relative %: 80 %
Platelets: 194 10*3/uL (ref 150–400)
Platelets: 196 10*3/uL (ref 150–400)
RBC: 4.45 MIL/uL (ref 4.22–5.81)
RBC: 4.18 MIL/uL — ABNORMAL LOW (ref 4.22–5.81)
RDW: 13 % (ref 11.5–15.5)
RDW: 12.9 % (ref 11.5–15.5)
WBC: 14.1 10*3/uL — ABNORMAL HIGH (ref 4.0–10.5)
WBC: 13.5 10*3/uL — ABNORMAL HIGH (ref 4.0–10.5)
nRBC: 0 % (ref 0.0–0.2)
nRBC: 0 % (ref 0.0–0.2)

## 2024-01-30 LAB — COMPREHENSIVE METABOLIC PANEL
ALT: 39 U/L (ref 0–44)
AST: 28 U/L (ref 15–41)
Albumin: 2.9 g/dL — ABNORMAL LOW (ref 3.5–5.0)
Alkaline Phosphatase: 61 U/L (ref 38–126)
Anion gap: 9 (ref 5–15)
BUN: 24 mg/dL — ABNORMAL HIGH (ref 8–23)
CO2: 26 mmol/L (ref 22–32)
Calcium: 8.7 mg/dL — ABNORMAL LOW (ref 8.9–10.3)
Chloride: 101 mmol/L (ref 98–111)
Creatinine, Ser: 0.99 mg/dL (ref 0.61–1.24)
GFR, Estimated: >60 mL/min (ref >60)
Glucose, Bld: 98 mg/dL (ref 70–99)
Potassium: 4.2 mmol/L (ref 3.5–5.1)
Sodium: 136 mmol/L (ref 135–145)
Total Bilirubin: 0.6 mg/dL (ref 0.0–1.2)
Total Protein: 6.3 g/dL — ABNORMAL LOW (ref 6.5–8.1)

## 2024-01-30 LAB — GLUCOSE, CAPILLARY
Glucose-Capillary: 93 mg/dL (ref 70–99)
Glucose-Capillary: 214 mg/dL — ABNORMAL HIGH (ref 70–99)

## 2024-01-30 LAB — URINALYSIS, COMPLETE (UACMP) WITH MICROSCOPIC
Bacteria, UA: NONE SEEN
Bilirubin Urine: NEGATIVE
Glucose, UA: NEGATIVE mg/dL
Hgb urine dipstick: NEGATIVE
Ketones, ur: NEGATIVE mg/dL
Nitrite: NEGATIVE
Protein, ur: NEGATIVE mg/dL
Specific Gravity, Urine: 1.011 (ref 1.005–1.030)
pH: 6 (ref 5.0–8.0)

## 2024-01-30 LAB — MAGNESIUM: Magnesium: 2.1 mg/dL (ref 1.7–2.4)

## 2024-01-30 LAB — PHOSPHORUS: Phosphorus: 4.4 mg/dL (ref 2.5–4.6)

## 2024-01-30 MED ORDER — IPRATROPIUM-ALBUTEROL 0.5-2.5 (3) MG/3ML IN SOLN: 3.0000 mL | RESPIRATORY_TRACT | Status: AC | PRN

## 2024-01-30 MED ORDER — ACETAMINOPHEN 325 MG PO TABS: 650.0000 mg | ORAL_TABLET | ORAL | 0 refills | Status: AC | PRN

## 2024-01-30 MED ORDER — FAMOTIDINE 20 MG PO TABS: 20.0000 mg | ORAL_TABLET | Freq: Two times a day (BID) | ORAL | Status: AC

## 2024-01-30 MED ORDER — GERHARDT'S BUTT CREAM: 1.0000 | TOPICAL_CREAM | Freq: Three times a day (TID) | CUTANEOUS | Status: AC

## 2024-01-30 MED ORDER — AMOXICILLIN-POT CLAVULANATE 875-125 MG PO TABS: 1.0000 | ORAL_TABLET | Freq: Two times a day (BID) | ORAL | 0 refills | Status: AC

## 2024-01-30 MED ORDER — LOSARTAN POTASSIUM 100 MG PO TABS: 100.0000 mg | ORAL_TABLET | Freq: Every day | ORAL | Status: DC

## 2024-01-30 MED ORDER — METOPROLOL TARTRATE 100 MG PO TABS: 100.0000 mg | ORAL_TABLET | Freq: Two times a day (BID) | ORAL | Status: DC

## 2024-01-30 MED ORDER — OXYCODONE HCL 5 MG PO TABS: 5.0000 mg | ORAL_TABLET | Freq: Every day | ORAL | 0 refills | Status: AC

## 2024-01-30 MED ORDER — ADULT MULTIVITAMIN W/MINERALS CH: 1.0000 | ORAL_TABLET | Freq: Every day | ORAL | Status: AC

## 2024-01-30 MED ORDER — INSULIN GLARGINE-YFGN 100 UNIT/ML ~~LOC~~ SOLN: 25.0000 [IU] | Freq: Two times a day (BID) | SUBCUTANEOUS | Status: AC

## 2024-01-30 MED ORDER — AMLODIPINE BESYLATE 10 MG PO TABS: 10.0000 mg | ORAL_TABLET | Freq: Every day | ORAL | Status: AC

## 2024-01-30 MED ORDER — AMOXICILLIN-POT CLAVULANATE 875-125 MG PO TABS
1.0000 | ORAL_TABLET | Freq: Two times a day (BID) | ORAL | Status: DC
  Administered 2024-01-30: 1 via ORAL
  Filled 2024-01-30: qty 1

## 2024-01-30 MED ORDER — HYDRALAZINE HCL 100 MG PO TABS: 100.0000 mg | ORAL_TABLET | Freq: Three times a day (TID) | ORAL | 0 refills | Status: AC

## 2024-01-30 NOTE — Plan of Care (Signed)
  Problem: Clinical Measurements: Goal: Diagnostic test results will improve Outcome: Not Progressing   Problem: Activity: Goal: Risk for activity intolerance will decrease Outcome: Not Progressing   Problem: Nutrition: Goal: Adequate nutrition will be maintained Outcome: Not Progressing   Problem: Skin Integrity: Goal: Risk for impaired skin integrity will decrease Outcome: Not Progressing   Problem: Intracerebral Hemorrhage Tissue Perfusion: Goal: Complications of Intracerebral Hemorrhage will be minimized Outcome: Not Progressing   Problem: Self-Care: Goal: Ability to participate in self-care as condition permits will improve Outcome: Not Progressing Goal: Verbalization of feelings and concerns over difficulty with self-care will improve Outcome: Not Progressing Goal: Ability to communicate needs accurately will improve Outcome: Not Progressing

## 2024-01-30 NOTE — TOC Transition Note (Signed)
Transition of Care [facility]) - Discharge Note   Patient Details  Name: Herbert Chapman MRN: [id number] Date of Birth: [birth year]  Transition of Care [facility]) CM/SW Contact:  Neelie Welshans Felipa Emory, Student-Social Work Phone Number: 01/30/2024, 3:20 PM   Clinical Narrative:   MSW Student received insurance approval for patient to admit to SNF. MSW Student confirmed with MD that patient is stable for discharge. MSW Student notified Windy Fast and they are in agreement with discharge. MSW Student confirmed bed is available at SNF. Transport arranged for next available.   Number to call report [phone] Rm:[phone]    Final next level of care: Skilled Nursing Facility Barriers to Discharge: Barriers Resolved   Patient Goals and CMS Choice Patient states their goals for this hospitalization and ongoing recovery are:: Rehab CMS Medicare.gov Compare Post Acute Care list provided to:: Patient Choice offered to / list presented to : Patient [place] ownership interest in [facility].provided to:: Patient    Discharge Placement              Patient chooses bed at: [facility] Patient to be transferred to facility by: [facility] Name of family member notified: Windy Fast Patient and family notified of of transfer: 01/30/24  Discharge Plan and Services Additional resources added to the After Visit Summary for   In-house Referral: Clinical Social Work                                   Social Drivers of Health (SDOH) Interventions SDOH Screenings   Food Insecurity: No Food Insecurity (01/22/2024)  Housing: Unknown (01/22/2024)  Transportation Needs: No Transportation Needs (01/22/2024)  Utilities: Not At Risk (01/22/2024)  Social Connections: Socially Integrated (01/22/2024)  Tobacco Use: Medium Risk (01/22/2024)     Readmission Risk Interventions     No data to display

## 2024-01-31 DIAGNOSIS — F32A Depression, unspecified: Secondary | ICD-10-CM | POA: Diagnosis not present

## 2024-01-31 DIAGNOSIS — K219 Gastro-esophageal reflux disease without esophagitis: Secondary | ICD-10-CM | POA: Diagnosis not present

## 2024-01-31 DIAGNOSIS — I10 Essential (primary) hypertension: Secondary | ICD-10-CM | POA: Diagnosis not present

## 2024-01-31 DIAGNOSIS — E119 Type 2 diabetes mellitus without complications: Secondary | ICD-10-CM | POA: Diagnosis not present

## 2024-02-03 DIAGNOSIS — E119 Type 2 diabetes mellitus without complications: Secondary | ICD-10-CM | POA: Diagnosis not present

## 2024-02-04 LAB — CULTURE, BLOOD (ROUTINE X 2)
Culture: NO GROWTH
Culture: NO GROWTH

## 2024-02-05 DIAGNOSIS — I618 Other nontraumatic intracerebral hemorrhage: Secondary | ICD-10-CM | POA: Diagnosis not present

## 2024-02-05 DIAGNOSIS — E119 Type 2 diabetes mellitus without complications: Secondary | ICD-10-CM | POA: Diagnosis not present

## 2024-02-05 DIAGNOSIS — I10 Essential (primary) hypertension: Secondary | ICD-10-CM | POA: Diagnosis not present

## 2024-02-05 DIAGNOSIS — N178 Other acute kidney failure: Secondary | ICD-10-CM | POA: Diagnosis not present

## 2024-02-09 DIAGNOSIS — M6281 Muscle weakness (generalized): Secondary | ICD-10-CM | POA: Diagnosis not present

## 2024-02-09 DIAGNOSIS — I10 Essential (primary) hypertension: Secondary | ICD-10-CM | POA: Diagnosis not present

## 2024-02-09 DIAGNOSIS — I618 Other nontraumatic intracerebral hemorrhage: Secondary | ICD-10-CM | POA: Diagnosis not present

## 2024-02-11 DIAGNOSIS — I10 Essential (primary) hypertension: Secondary | ICD-10-CM | POA: Diagnosis not present

## 2024-02-11 DIAGNOSIS — G8929 Other chronic pain: Secondary | ICD-10-CM | POA: Diagnosis not present

## 2024-02-11 DIAGNOSIS — E119 Type 2 diabetes mellitus without complications: Secondary | ICD-10-CM | POA: Diagnosis not present

## 2024-02-13 DIAGNOSIS — I10 Essential (primary) hypertension: Secondary | ICD-10-CM | POA: Diagnosis not present

## 2024-02-13 DIAGNOSIS — I618 Other nontraumatic intracerebral hemorrhage: Secondary | ICD-10-CM | POA: Diagnosis not present

## 2024-02-17 DIAGNOSIS — I618 Other nontraumatic intracerebral hemorrhage: Secondary | ICD-10-CM | POA: Diagnosis not present

## 2024-02-17 DIAGNOSIS — I10 Essential (primary) hypertension: Secondary | ICD-10-CM | POA: Diagnosis not present

## 2024-02-19 DIAGNOSIS — E119 Type 2 diabetes mellitus without complications: Secondary | ICD-10-CM | POA: Diagnosis not present

## 2024-02-19 DIAGNOSIS — I618 Other nontraumatic intracerebral hemorrhage: Secondary | ICD-10-CM | POA: Diagnosis not present

## 2024-02-26 DIAGNOSIS — E119 Type 2 diabetes mellitus without complications: Secondary | ICD-10-CM | POA: Diagnosis not present

## 2024-02-26 DIAGNOSIS — M6281 Muscle weakness (generalized): Secondary | ICD-10-CM | POA: Diagnosis not present

## 2024-02-26 NOTE — Progress Notes (Signed)
 [facility] [address] [place]. [place] [address] [phone]       HOSPITAL FOLLOW UP NOTE  [name] Date of Birth:  [birth year] Medical Record Number:  [id number]   Reason for Referral:  hospital stroke follow up    SUBJECTIVE:   CHIEF COMPLAINT:  Chief Complaint  Patient presents with   Follow-up    Patient in room #8 with his partner Herbert Chapman. Patient states he here today to follow up for his stroke.    HPI:   [name] is a 78 y.o. male with history of diabetes, hypertension, migraines and previous smoking who presented on 01/22/2024 with acute onset left-sided weakness.  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  CTA head/neck showed severe greater than 90% stenosis of bilateral ICAs, no evidence of aneurysm or AVM, severe stenosis right PCA P2 and P3, severe stenosis left PCA segment, moderate stenosis within right ICA cavernous segment.  Repeat CT head showed stable IPH.  Unable to complete MRI due to spinal stimulator.  EF 70 to 75%.  LDL 10.  A1c 6.6.  On aspirin PTA and discontinued in setting of IPH.  Initially required Cleviprex drip and eventually discontinued, placed on amlodipine, doxazosin, hydralazine, metoprolol and losartan.  Atorvastatin discontinued due to low LDL in setting of IPH.  DM controlled on metformin.  Was intubated for short time after aspiration event and treated for aspiration pneumonia.  Therapies recommended SNF for therapy needs with residual left-sided weakness and dysphagia.    Today, 03/01/2024, patient is being seen for initial hospital follow-up accompanied by his significant other, Herbert Chapman.  Currently residing at [facility] rehab. Reports some improvement since d/c.  Continues to have left-sided weakness which he feels can fluctuate and at times difficulty controlling.  Currently working with therapies.  Currently nonambulatory, transfers via wheelchair, is able to  stand/pivot for transfers. Denies residual dysphagia, reports completing swallowing study which was normal and currently tolerating a regular diet. He does plan on returning home eventually but unsure of timeframe. No new stroke/TIA symptoms.  Blood pressure has been well-controlled, reports BP checked daily at rehab but he is unsure of his recent numbers.  Reviewed paperwork from SNF with last BP reading 3/7 at 138/68, typically ranges 110-130s/60-80s.  BP today on slightly lower side at 92/58 but does not believe this is typical.  Per SNF MAR, remains on amlodipine 10 mg daily, doxazosin 4 mg nightly, hydralazine 100 mg every 8 hours, losartan 100 mg daily and metoprolol 100 mg twice daily.  No further questions or concerns at this time.         PERTINENT IMAGING  Code Stroke CT head 7 mm acute parenchymal hemorrhage centered at right thalamocapsular junction with mild surrounding edema, moderate volume IVH and no evidence of hydrocephalus CTA head & neck no LVO, severe, greater than 90% stenoses in bilateral internal carotid artery origins, no evidence of aneurysm or AVM in the region of the IPH, severe stenosis within right PCA P2 and P3 segments, severe stenosis within left PCA segment, moderate stenosis within right ICA cavernous segment Repeat head CT 2/14 stable IPH with no increase in hydrocephalus MRI unable to perform due to spinal stimulator 2D Echo EF 70 to 75% LDL 10 HgbA1c 6.6     ROS:   14 system review of systems performed and negative with exception of those listed in HPI  PMH:  Past Medical History:  Diagnosis Date  Anxiety    Arthritis    Cancer (HCC)    squamous cell CA removed from skin   Depression    Diabetes mellitus without complication (HCC)    GERD (gastroesophageal reflux disease)    rare-no meds   Headache    h/o migraines   Hepatitis    Hep C-pt had injections and pill and pt states he does not have hep c anymore   History of kidney stones     h/o age 62   Hypertension    Mental disorder    Pancreatitis 2012   Personal history of tobacco use, presenting hazards to health 11/07/2015    PSH:  Past Surgical History:  Procedure Laterality Date   CERVICAL SPINE SURGERY     CIRCUMCISION     COLONOSCOPY     EYE SURGERY     bilateral cataract removal; implants   LUMBAR LAMINECTOMY/DECOMPRESSION MICRODISCECTOMY  10/07/2012   Procedure: LUMBAR LAMINECTOMY/DECOMPRESSION MICRODISCECTOMY 2 LEVELS;  Surgeon: Tia Alert, MD;  Location: [facility];  Service: Neurosurgery;  Laterality: Left;  Left Lumbar two-three,lumbar four-five hemilaminectomy   PILONIDAL CYST EXCISION     SPINAL CORD STIMULATOR IMPLANT  2018   [employer]   TONSILLECTOMY     TOTAL SHOULDER ARTHROPLASTY Right 10/13/2019   Procedure: TOTAL SHOULDER ARTHROPLASTY;  Surgeon: Lyndle Herrlich, MD;  Location: [facility] ORS;  Service: Orthopedics;  Laterality: Right;    Social History:  Social History   Socioeconomic History   Marital status: Single    Spouse name: Not on file   Number of children: Not on file   Years of education: Not on file   Highest education level: Not on file  Occupational History   Not on file  Tobacco Use   Smoking status: Former    Current packs/day: 0.00    Average packs/day: 1 pack/day for 30.0 years (30.0 ttl pk-yrs)    Types: Cigarettes    Start date: 10/07/1988    Quit date: 10/07/2018    Years since quitting: 5.4   Smokeless tobacco: Never  Vaping Use   Vaping status: Never Used  Substance and Sexual Activity   Alcohol use: Not Currently   Drug use: No   Sexual activity: Not on file  Other Topics Concern   Not on file  Social History Narrative   Not on file   Social Drivers of Health   Financial Resource Strain: Not on file  Food Insecurity: No Food Insecurity (01/22/2024)   Hunger Vital Sign    Worried About Running Out of Food in the Last Year: Never true    Ran Out of Food in the Last Year: Never true   Transportation Needs: No Transportation Needs (01/22/2024)   PRAPARE - Administrator, Civil Service (Medical): No    Lack of Transportation (Non-Medical): No  Physical Activity: Not on file  Stress: Not on file  Social Connections: Socially Integrated (01/22/2024)   Social Connection and Isolation Panel [NHANES]    Frequency of Communication with Friends and Family: Three times a week    Frequency of Social Gatherings with Friends and Family: Twice a week    Attends Religious Services: 1 to 4 times per year    Active Member of [employer] or Organizations: Yes    Attends Banker Meetings: 1 to 4 times per year    Marital Status: Living with partner  Intimate Partner Violence: Unknown (01/22/2024)   Humiliation, Afraid, Rape, and Kick questionnaire  Fear of Current or Ex-Partner: Patient declined    Emotionally Abused: No    Physically Abused: No    Sexually Abused: No    Family History: No family history on file.  Medications:   Current Outpatient Medications on File Prior to Visit  Medication Sig Dispense Refill   acetaminophen (TYLENOL) 325 MG tablet Take 2 tablets (650 mg total) by mouth every 4 (four) hours as needed for mild pain (pain score 1-3) (or temp > 37.5 C (99.5 F)). 30 tablet 0   amLODipine (NORVASC) 10 MG tablet Take 1 tablet (10 mg total) by mouth daily.     doxazosin (CARDURA) 4 MG tablet Take 4 mg by mouth at bedtime.     famotidine (PEPCID) 20 MG tablet Take 1 tablet (20 mg total) by mouth 2 (two) times daily.     FLUoxetine (PROZAC) 20 MG capsule Take 60 mg by mouth every morning.      hydrALAZINE (APRESOLINE) 100 MG tablet Take 1 tablet (100 mg total) by mouth every 8 (eight) hours. 90 tablet 0   insulin glargine-yfgn (SEMGLEE) 100 UNIT/ML injection Inject 0.25 mLs (25 Units total) into the skin 2 (two) times daily.     ipratropium-albuterol (DUONEB) 0.5-2.5 (3) MG/3ML SOLN Take 3 mLs by nebulization every 4 (four) hours as needed.      losartan (COZAAR) 100 MG tablet Take 1 tablet (100 mg total) by mouth daily.     metFORMIN (GLUCOPHAGE) 1000 MG tablet Take 1,000 mg by mouth 2 (two) times daily with a meal.     metoprolol tartrate (LOPRESSOR) 100 MG tablet Take 1 tablet (100 mg total) by mouth 2 (two) times daily.     Multiple Vitamin (MULTIVITAMIN WITH MINERALS) TABS tablet Take 1 tablet by mouth daily.     naloxone (NARCAN) nasal spray 4 mg/0.1 mL Place 1 spray into the nose.     Nystatin (GERHARDT'S BUTT CREAM) CREA Apply 1 Application topically 3 (three) times daily.     oxyCODONE (OXY IR/ROXICODONE) 5 MG immediate release tablet Take 1 tablet (5 mg total) by mouth 5 (five) times daily. 15 tablet 0   No current facility-administered medications on file prior to visit.    Allergies:  No Known Allergies    OBJECTIVE:  Physical Exam  Vitals:   03/01/24 0945  BP: (!) 92/58  Pulse: 88  Height: 5\' 8"  (1.727 m)   Body mass index is 27.89 kg/m. No results found.   General: well developed, well nourished, very pleasant elderly Caucasian male, seated, in no evident distress Head: head normocephalic and atraumatic.   Neck: supple with no carotid or supraclavicular bruits Cardiovascular: regular rate and rhythm, no murmurs Musculoskeletal: no deformity Skin:  no rash/petichiae Vascular:  Normal pulses all extremities   Neurologic Exam Mental Status: Awake and fully alert.  Mild speech hesitancy but no evidence of aphasia.  Follows commands without difficulty.  Oriented to place and time. Recent memory mildly impaired and remote memory intact. Attention span and concentration mildly impaired and fund of knowledge appropriate. Mood and affect appropriate.  Cranial Nerves: Pupils equal, briskly reactive to light. Extraocular movements full without nystagmus. Visual fields full to confrontation. Hearing intact. Facial sensation intact.  Very slight left lower facial weakness.  Tongue, palate moves normally and  symmetrically.  Motor: Normal strength, bulk and tone right upper and lower extremity.  LUE 3/5 deltoid and grip strength, 4+/5 elbow extension and flexion; LLE 3/5 HF and ADF, 4+/5 KE and KF Sensory.: intact  to touch , pinprick , position and vibratory sensation.  Coordination: Rapid alternating movements normal on left side. Finger-to-nose and heel-to-shin performed accurately on left side. Gait and Station: Deferred    NIHSS  3 Modified Rankin  3-4      ASSESSMENT: Herbert Chapman is a 78 y.o. 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 Vascular risk factors include HTN, HLD, DM, bilateral carotid stenosis, intracranial stenosis, advanced age and history of migraines.     PLAN:  IPH:  Residual deficit: Left hemiparesis and gait impairment. Continue working with therapies at [facility]. Discussed typical recovery time. Repeat CT head, if stable, consider restart aspirin 81mg  daily and potentially statin d/t extensive intracranial stenosis and severe bilateral carotid stenosis. Remains off statin due to low LDL in setting of IPH, recommend ongoing monitored by PCP Discussed secondary stroke prevention measures and importance of close PCP follow up for aggressive stroke risk factor management including BP goal between 110-130s to ensure perfusion, HLD with LDL goal<70 and DM with A1c.<7 .  Stroke labs 01/2024: LDL 10, A1c 6.6 I have gone over the pathophysiology of stroke, warning signs and symptoms, risk factors and their management in some detail with instructions to go to the closest emergency room for symptoms of concern.  Bilateral carotid stenosis Obtain carotid ultrasound - request to be completed in [place] CTA head/neck 01/2024 bilateral carotid bifurcation and within proximal ICA greater than 90% stenosis and distal cervical ICA 50% stenosis     No further recommendations from stroke standpoint.and due to length of travel  time to come to office, will follow up as needed at this time which patient and significant other agree with but advised to call with any questions or concerns in the future.    CC:  [facility] provider: Dr. Pearlean Brownie PCP: Gracelyn Nurse, MD    I spent 55 minutes of face-to-face and non-face-to-face time with patient and SO.  This included previsit chart review including extensive review of recent hospitalization, lab review, study review, order entry, electronic health record documentation, patient and SO education and discussion regarding above diagnoses and treatment plan and answered all other questions to patient and SO's satisfaction  Ihor Austin, [facility]  [facility] [address] [place], [place] [address]  Phone [phone] Fax [phone] Note: This document was prepared with digital dictation and possible smart phrase technology. Any transcriptional errors that result from this process are unintentional.

## 2024-03-01 ENCOUNTER — Encounter: Payer: Self-pay | Admitting: Adult Health

## 2024-03-01 ENCOUNTER — Ambulatory Visit (INDEPENDENT_AMBULATORY_CARE_PROVIDER_SITE_OTHER): Payer: Medicare HMO | Admitting: Adult Health

## 2024-03-01 VITALS — BP 92/58 | HR 88 | Ht 68.0 in

## 2024-03-01 DIAGNOSIS — I618 Other nontraumatic intracerebral hemorrhage: Secondary | ICD-10-CM

## 2024-03-01 DIAGNOSIS — I6523 Occlusion and stenosis of bilateral carotid arteries: Secondary | ICD-10-CM | POA: Diagnosis not present

## 2024-03-01 DIAGNOSIS — I69354 Hemiplegia and hemiparesis following cerebral infarction affecting left non-dominant side: Secondary | ICD-10-CM | POA: Diagnosis not present

## 2024-03-01 NOTE — Patient Instructions (Signed)
 Continue working with therapies for hopeful ongoing recovery - speak with your PT about low back pain to see if there are certain exercises/stretches that can be done when pain increases  You will be called to repeat a CT scan of your head to look for resolution of prior hemorrhage. If resolved, will consider restarting aspirin 81 mg daily as well as statin for carotid artery stenosis (narrowing)  Recommend completion of carotid ultrasound for further evaluation of carotid arteries  Continue to follow up with PCP regarding blood pressure, cholesterol and diabetes management  Maintain strict control of hypertension with blood pressure goal below 110-130s/70-90s (to ensure adequate perfusion), diabetes with hemoglobin A1c goal below 7.0 % and cholesterol with LDL cholesterol (bad cholesterol) goal below 70 mg/dL.   Signs of a Stroke? Follow the BEFAST method:  Balance Watch for a sudden loss of balance, trouble with coordination or vertigo Eyes Is there a sudden loss of vision in one or both eyes? Or double vision?  Face: Ask the person to smile. Does one side of the face droop or is it numb?  Arms: Ask the person to raise both arms. Does one arm drift downward? Is there weakness or numbness of a leg? Speech: Ask the person to repeat a simple phrase. Does the speech sound slurred/strange? Is the person confused ? Time: If you observe any of these signs, call 911.          Thank you for coming to see [place] at [facility]. I hope we have been able to provide you high quality care today.  You may receive a patient satisfaction survey over the next few weeks. We would appreciate your feedback and comments so that we may continue to improve ourselves and the health of our patients.

## 2024-03-02 NOTE — Progress Notes (Signed)
 I agree with the above plan

## 2024-03-04 ENCOUNTER — Telehealth: Payer: Self-pay | Admitting: Adult Health

## 2024-03-04 DIAGNOSIS — I10 Essential (primary) hypertension: Secondary | ICD-10-CM | POA: Diagnosis not present

## 2024-03-04 DIAGNOSIS — I618 Other nontraumatic intracerebral hemorrhage: Secondary | ICD-10-CM | POA: Diagnosis not present

## 2024-03-04 DIAGNOSIS — E119 Type 2 diabetes mellitus without complications: Secondary | ICD-10-CM | POA: Diagnosis not present

## 2024-03-04 DIAGNOSIS — K219 Gastro-esophageal reflux disease without esophagitis: Secondary | ICD-10-CM | POA: Diagnosis not present

## 2024-03-04 NOTE — Telephone Encounter (Signed)
 [name]: [phone] exp. 03/04/24-05/03/24 sent to [facility] [phone]

## 2024-03-11 ENCOUNTER — Ambulatory Visit
Admission: RE | Admit: 2024-03-11 | Discharge: 2024-03-11 | Disposition: A | Discharge status: Home / Self Care | Source: Ambulatory Visit | Attending: Adult Health | Admitting: Adult Health

## 2024-03-11 DIAGNOSIS — I618 Other nontraumatic intracerebral hemorrhage: Secondary | ICD-10-CM | POA: Insufficient documentation

## 2024-03-11 DIAGNOSIS — E119 Type 2 diabetes mellitus without complications: Secondary | ICD-10-CM | POA: Diagnosis not present

## 2024-03-11 DIAGNOSIS — I615 Nontraumatic intracerebral hemorrhage, intraventricular: Secondary | ICD-10-CM | POA: Diagnosis not present

## 2024-03-11 DIAGNOSIS — I672 Cerebral atherosclerosis: Secondary | ICD-10-CM | POA: Diagnosis not present

## 2024-03-11 DIAGNOSIS — I10 Essential (primary) hypertension: Secondary | ICD-10-CM | POA: Diagnosis not present

## 2024-03-11 DIAGNOSIS — G8929 Other chronic pain: Secondary | ICD-10-CM | POA: Diagnosis not present

## 2024-03-16 DIAGNOSIS — I618 Other nontraumatic intracerebral hemorrhage: Secondary | ICD-10-CM | POA: Diagnosis not present

## 2024-03-17 DIAGNOSIS — Z9682 Presence of neurostimulator: Secondary | ICD-10-CM | POA: Diagnosis not present

## 2024-03-17 DIAGNOSIS — I69154 Hemiplegia and hemiparesis following nontraumatic intracerebral hemorrhage affecting left non-dominant side: Secondary | ICD-10-CM | POA: Diagnosis not present

## 2024-03-17 DIAGNOSIS — F419 Anxiety disorder, unspecified: Secondary | ICD-10-CM | POA: Diagnosis not present

## 2024-03-17 DIAGNOSIS — I10 Essential (primary) hypertension: Secondary | ICD-10-CM | POA: Diagnosis not present

## 2024-03-17 DIAGNOSIS — Z85828 Personal history of other malignant neoplasm of skin: Secondary | ICD-10-CM | POA: Diagnosis not present

## 2024-03-17 DIAGNOSIS — F32A Depression, unspecified: Secondary | ICD-10-CM | POA: Diagnosis not present

## 2024-03-17 DIAGNOSIS — Z87442 Personal history of urinary calculi: Secondary | ICD-10-CM | POA: Diagnosis not present

## 2024-03-17 DIAGNOSIS — E119 Type 2 diabetes mellitus without complications: Secondary | ICD-10-CM | POA: Diagnosis not present

## 2024-03-17 DIAGNOSIS — K219 Gastro-esophageal reflux disease without esophagitis: Secondary | ICD-10-CM | POA: Diagnosis not present

## 2024-03-19 DIAGNOSIS — I69154 Hemiplegia and hemiparesis following nontraumatic intracerebral hemorrhage affecting left non-dominant side: Secondary | ICD-10-CM | POA: Diagnosis not present

## 2024-03-19 DIAGNOSIS — Z87442 Personal history of urinary calculi: Secondary | ICD-10-CM | POA: Diagnosis not present

## 2024-03-19 DIAGNOSIS — Z9682 Presence of neurostimulator: Secondary | ICD-10-CM | POA: Diagnosis not present

## 2024-03-19 DIAGNOSIS — Z85828 Personal history of other malignant neoplasm of skin: Secondary | ICD-10-CM | POA: Diagnosis not present

## 2024-03-19 DIAGNOSIS — F419 Anxiety disorder, unspecified: Secondary | ICD-10-CM | POA: Diagnosis not present

## 2024-03-19 DIAGNOSIS — F32A Depression, unspecified: Secondary | ICD-10-CM | POA: Diagnosis not present

## 2024-03-19 DIAGNOSIS — K219 Gastro-esophageal reflux disease without esophagitis: Secondary | ICD-10-CM | POA: Diagnosis not present

## 2024-03-19 DIAGNOSIS — E119 Type 2 diabetes mellitus without complications: Secondary | ICD-10-CM | POA: Diagnosis not present

## 2024-03-19 DIAGNOSIS — I10 Essential (primary) hypertension: Secondary | ICD-10-CM | POA: Diagnosis not present

## 2024-03-23 ENCOUNTER — Other Ambulatory Visit: Payer: Self-pay | Admitting: Adult Health

## 2024-03-23 DIAGNOSIS — Z87442 Personal history of urinary calculi: Secondary | ICD-10-CM | POA: Diagnosis not present

## 2024-03-23 DIAGNOSIS — K219 Gastro-esophageal reflux disease without esophagitis: Secondary | ICD-10-CM | POA: Diagnosis not present

## 2024-03-23 DIAGNOSIS — F32A Depression, unspecified: Secondary | ICD-10-CM | POA: Diagnosis not present

## 2024-03-23 DIAGNOSIS — Z9682 Presence of neurostimulator: Secondary | ICD-10-CM | POA: Diagnosis not present

## 2024-03-23 DIAGNOSIS — I69154 Hemiplegia and hemiparesis following nontraumatic intracerebral hemorrhage affecting left non-dominant side: Secondary | ICD-10-CM | POA: Diagnosis not present

## 2024-03-23 DIAGNOSIS — E119 Type 2 diabetes mellitus without complications: Secondary | ICD-10-CM | POA: Diagnosis not present

## 2024-03-23 DIAGNOSIS — I10 Essential (primary) hypertension: Secondary | ICD-10-CM | POA: Diagnosis not present

## 2024-03-23 DIAGNOSIS — Z85828 Personal history of other malignant neoplasm of skin: Secondary | ICD-10-CM | POA: Diagnosis not present

## 2024-03-23 DIAGNOSIS — F419 Anxiety disorder, unspecified: Secondary | ICD-10-CM | POA: Diagnosis not present

## 2024-03-23 MED ORDER — ASPIRIN 81 MG PO TBEC
81.0000 mg | DELAYED_RELEASE_TABLET | Freq: Every day | ORAL | 5 refills | Status: AC
Start: 2024-03-23 — End: ?

## 2024-03-23 MED ORDER — ROSUVASTATIN CALCIUM 5 MG PO TABS: 5.0000 mg | ORAL_TABLET | Freq: Every day | ORAL | 5 refills | Status: DC

## 2024-03-25 ENCOUNTER — Telehealth: Payer: Self-pay

## 2024-03-25 DIAGNOSIS — F32A Depression, unspecified: Secondary | ICD-10-CM | POA: Diagnosis not present

## 2024-03-25 DIAGNOSIS — I69154 Hemiplegia and hemiparesis following nontraumatic intracerebral hemorrhage affecting left non-dominant side: Secondary | ICD-10-CM | POA: Diagnosis not present

## 2024-03-25 DIAGNOSIS — I10 Essential (primary) hypertension: Secondary | ICD-10-CM | POA: Diagnosis not present

## 2024-03-25 DIAGNOSIS — K219 Gastro-esophageal reflux disease without esophagitis: Secondary | ICD-10-CM | POA: Diagnosis not present

## 2024-03-25 DIAGNOSIS — Z9682 Presence of neurostimulator: Secondary | ICD-10-CM | POA: Diagnosis not present

## 2024-03-25 DIAGNOSIS — Z87442 Personal history of urinary calculi: Secondary | ICD-10-CM | POA: Diagnosis not present

## 2024-03-25 DIAGNOSIS — Z85828 Personal history of other malignant neoplasm of skin: Secondary | ICD-10-CM | POA: Diagnosis not present

## 2024-03-25 DIAGNOSIS — F419 Anxiety disorder, unspecified: Secondary | ICD-10-CM | POA: Diagnosis not present

## 2024-03-25 DIAGNOSIS — E119 Type 2 diabetes mellitus without complications: Secondary | ICD-10-CM | POA: Diagnosis not present

## 2024-03-25 NOTE — Telephone Encounter (Signed)
 Attempted to call Pt. No answer, LVM for call back.

## 2024-03-25 NOTE — Telephone Encounter (Signed)
-----   Message from Johny Nap sent at 03/23/2024  4:10 PM EDT ----- Please advise patient that his recent CT scan showed resolution of prior bleed.  It is recommended to initiate aspirin 81 mg daily as well as Crestor 5mg  daily for further stroke prevention measures and known bilateral carotid stenosis.  Orders will be placed but request ongoing refills and monitoring by PCP.  It will be important to proceed with carotid ultrasound as scheduled on 5/1 for further evaluation of carotid stenosis.

## 2024-03-29 DIAGNOSIS — Z9682 Presence of neurostimulator: Secondary | ICD-10-CM | POA: Diagnosis not present

## 2024-03-29 DIAGNOSIS — Z85828 Personal history of other malignant neoplasm of skin: Secondary | ICD-10-CM | POA: Diagnosis not present

## 2024-03-29 DIAGNOSIS — I10 Essential (primary) hypertension: Secondary | ICD-10-CM | POA: Diagnosis not present

## 2024-03-29 DIAGNOSIS — K219 Gastro-esophageal reflux disease without esophagitis: Secondary | ICD-10-CM | POA: Diagnosis not present

## 2024-03-29 DIAGNOSIS — Z87442 Personal history of urinary calculi: Secondary | ICD-10-CM | POA: Diagnosis not present

## 2024-03-29 DIAGNOSIS — E119 Type 2 diabetes mellitus without complications: Secondary | ICD-10-CM | POA: Diagnosis not present

## 2024-03-29 DIAGNOSIS — F32A Depression, unspecified: Secondary | ICD-10-CM | POA: Diagnosis not present

## 2024-03-29 DIAGNOSIS — F419 Anxiety disorder, unspecified: Secondary | ICD-10-CM | POA: Diagnosis not present

## 2024-03-29 DIAGNOSIS — I69154 Hemiplegia and hemiparesis following nontraumatic intracerebral hemorrhage affecting left non-dominant side: Secondary | ICD-10-CM | POA: Diagnosis not present

## 2024-03-30 DIAGNOSIS — F419 Anxiety disorder, unspecified: Secondary | ICD-10-CM | POA: Diagnosis not present

## 2024-03-30 DIAGNOSIS — I69154 Hemiplegia and hemiparesis following nontraumatic intracerebral hemorrhage affecting left non-dominant side: Secondary | ICD-10-CM | POA: Diagnosis not present

## 2024-03-30 DIAGNOSIS — Z87442 Personal history of urinary calculi: Secondary | ICD-10-CM | POA: Diagnosis not present

## 2024-03-30 DIAGNOSIS — F32A Depression, unspecified: Secondary | ICD-10-CM | POA: Diagnosis not present

## 2024-03-30 DIAGNOSIS — K219 Gastro-esophageal reflux disease without esophagitis: Secondary | ICD-10-CM | POA: Diagnosis not present

## 2024-03-30 DIAGNOSIS — I10 Essential (primary) hypertension: Secondary | ICD-10-CM | POA: Diagnosis not present

## 2024-03-30 DIAGNOSIS — E119 Type 2 diabetes mellitus without complications: Secondary | ICD-10-CM | POA: Diagnosis not present

## 2024-03-30 DIAGNOSIS — Z85828 Personal history of other malignant neoplasm of skin: Secondary | ICD-10-CM | POA: Diagnosis not present

## 2024-03-30 DIAGNOSIS — Z9682 Presence of neurostimulator: Secondary | ICD-10-CM | POA: Diagnosis not present

## 2024-04-01 DIAGNOSIS — F32A Depression, unspecified: Secondary | ICD-10-CM | POA: Diagnosis not present

## 2024-04-01 DIAGNOSIS — E119 Type 2 diabetes mellitus without complications: Secondary | ICD-10-CM | POA: Diagnosis not present

## 2024-04-01 DIAGNOSIS — K219 Gastro-esophageal reflux disease without esophagitis: Secondary | ICD-10-CM | POA: Diagnosis not present

## 2024-04-01 DIAGNOSIS — F419 Anxiety disorder, unspecified: Secondary | ICD-10-CM | POA: Diagnosis not present

## 2024-04-01 DIAGNOSIS — Z85828 Personal history of other malignant neoplasm of skin: Secondary | ICD-10-CM | POA: Diagnosis not present

## 2024-04-01 DIAGNOSIS — Z9682 Presence of neurostimulator: Secondary | ICD-10-CM | POA: Diagnosis not present

## 2024-04-01 DIAGNOSIS — Z87442 Personal history of urinary calculi: Secondary | ICD-10-CM | POA: Diagnosis not present

## 2024-04-01 DIAGNOSIS — I69154 Hemiplegia and hemiparesis following nontraumatic intracerebral hemorrhage affecting left non-dominant side: Secondary | ICD-10-CM | POA: Diagnosis not present

## 2024-04-01 DIAGNOSIS — I10 Essential (primary) hypertension: Secondary | ICD-10-CM | POA: Diagnosis not present

## 2024-04-02 DIAGNOSIS — F32A Depression, unspecified: Secondary | ICD-10-CM | POA: Diagnosis not present

## 2024-04-02 DIAGNOSIS — I10 Essential (primary) hypertension: Secondary | ICD-10-CM | POA: Diagnosis not present

## 2024-04-02 DIAGNOSIS — Z85828 Personal history of other malignant neoplasm of skin: Secondary | ICD-10-CM | POA: Diagnosis not present

## 2024-04-02 DIAGNOSIS — K219 Gastro-esophageal reflux disease without esophagitis: Secondary | ICD-10-CM | POA: Diagnosis not present

## 2024-04-02 DIAGNOSIS — E119 Type 2 diabetes mellitus without complications: Secondary | ICD-10-CM | POA: Diagnosis not present

## 2024-04-02 DIAGNOSIS — Z9682 Presence of neurostimulator: Secondary | ICD-10-CM | POA: Diagnosis not present

## 2024-04-02 DIAGNOSIS — I69154 Hemiplegia and hemiparesis following nontraumatic intracerebral hemorrhage affecting left non-dominant side: Secondary | ICD-10-CM | POA: Diagnosis not present

## 2024-04-02 DIAGNOSIS — Z87442 Personal history of urinary calculi: Secondary | ICD-10-CM | POA: Diagnosis not present

## 2024-04-02 DIAGNOSIS — F419 Anxiety disorder, unspecified: Secondary | ICD-10-CM | POA: Diagnosis not present

## 2024-04-05 DIAGNOSIS — Z87442 Personal history of urinary calculi: Secondary | ICD-10-CM | POA: Diagnosis not present

## 2024-04-05 DIAGNOSIS — I10 Essential (primary) hypertension: Secondary | ICD-10-CM | POA: Diagnosis not present

## 2024-04-05 DIAGNOSIS — F419 Anxiety disorder, unspecified: Secondary | ICD-10-CM | POA: Diagnosis not present

## 2024-04-05 DIAGNOSIS — Z85828 Personal history of other malignant neoplasm of skin: Secondary | ICD-10-CM | POA: Diagnosis not present

## 2024-04-05 DIAGNOSIS — E119 Type 2 diabetes mellitus without complications: Secondary | ICD-10-CM | POA: Diagnosis not present

## 2024-04-05 DIAGNOSIS — F32A Depression, unspecified: Secondary | ICD-10-CM | POA: Diagnosis not present

## 2024-04-05 DIAGNOSIS — K219 Gastro-esophageal reflux disease without esophagitis: Secondary | ICD-10-CM | POA: Diagnosis not present

## 2024-04-05 DIAGNOSIS — Z9682 Presence of neurostimulator: Secondary | ICD-10-CM | POA: Diagnosis not present

## 2024-04-05 DIAGNOSIS — I69154 Hemiplegia and hemiparesis following nontraumatic intracerebral hemorrhage affecting left non-dominant side: Secondary | ICD-10-CM | POA: Diagnosis not present

## 2024-04-06 DIAGNOSIS — I10 Essential (primary) hypertension: Secondary | ICD-10-CM | POA: Diagnosis not present

## 2024-04-06 DIAGNOSIS — F32A Depression, unspecified: Secondary | ICD-10-CM | POA: Diagnosis not present

## 2024-04-06 DIAGNOSIS — F419 Anxiety disorder, unspecified: Secondary | ICD-10-CM | POA: Diagnosis not present

## 2024-04-06 DIAGNOSIS — Z9682 Presence of neurostimulator: Secondary | ICD-10-CM | POA: Diagnosis not present

## 2024-04-06 DIAGNOSIS — I69154 Hemiplegia and hemiparesis following nontraumatic intracerebral hemorrhage affecting left non-dominant side: Secondary | ICD-10-CM | POA: Diagnosis not present

## 2024-04-06 DIAGNOSIS — Z85828 Personal history of other malignant neoplasm of skin: Secondary | ICD-10-CM | POA: Diagnosis not present

## 2024-04-06 DIAGNOSIS — K219 Gastro-esophageal reflux disease without esophagitis: Secondary | ICD-10-CM | POA: Diagnosis not present

## 2024-04-06 DIAGNOSIS — E119 Type 2 diabetes mellitus without complications: Secondary | ICD-10-CM | POA: Diagnosis not present

## 2024-04-06 DIAGNOSIS — Z87442 Personal history of urinary calculi: Secondary | ICD-10-CM | POA: Diagnosis not present

## 2024-04-07 DIAGNOSIS — F32A Depression, unspecified: Secondary | ICD-10-CM | POA: Diagnosis not present

## 2024-04-07 DIAGNOSIS — K219 Gastro-esophageal reflux disease without esophagitis: Secondary | ICD-10-CM | POA: Diagnosis not present

## 2024-04-07 DIAGNOSIS — Z85828 Personal history of other malignant neoplasm of skin: Secondary | ICD-10-CM | POA: Diagnosis not present

## 2024-04-07 DIAGNOSIS — F419 Anxiety disorder, unspecified: Secondary | ICD-10-CM | POA: Diagnosis not present

## 2024-04-07 DIAGNOSIS — I10 Essential (primary) hypertension: Secondary | ICD-10-CM | POA: Diagnosis not present

## 2024-04-07 DIAGNOSIS — E119 Type 2 diabetes mellitus without complications: Secondary | ICD-10-CM | POA: Diagnosis not present

## 2024-04-07 DIAGNOSIS — I69154 Hemiplegia and hemiparesis following nontraumatic intracerebral hemorrhage affecting left non-dominant side: Secondary | ICD-10-CM | POA: Diagnosis not present

## 2024-04-08 ENCOUNTER — Ambulatory Visit: Attending: Adult Health

## 2024-04-08 DIAGNOSIS — Z9682 Presence of neurostimulator: Secondary | ICD-10-CM | POA: Diagnosis not present

## 2024-04-08 DIAGNOSIS — I69154 Hemiplegia and hemiparesis following nontraumatic intracerebral hemorrhage affecting left non-dominant side: Secondary | ICD-10-CM | POA: Diagnosis not present

## 2024-04-08 DIAGNOSIS — I10 Essential (primary) hypertension: Secondary | ICD-10-CM | POA: Diagnosis not present

## 2024-04-08 DIAGNOSIS — Z85828 Personal history of other malignant neoplasm of skin: Secondary | ICD-10-CM | POA: Diagnosis not present

## 2024-04-08 DIAGNOSIS — F32A Depression, unspecified: Secondary | ICD-10-CM | POA: Diagnosis not present

## 2024-04-08 DIAGNOSIS — K219 Gastro-esophageal reflux disease without esophagitis: Secondary | ICD-10-CM | POA: Diagnosis not present

## 2024-04-08 DIAGNOSIS — E119 Type 2 diabetes mellitus without complications: Secondary | ICD-10-CM | POA: Diagnosis not present

## 2024-04-08 DIAGNOSIS — Z87442 Personal history of urinary calculi: Secondary | ICD-10-CM | POA: Diagnosis not present

## 2024-04-08 DIAGNOSIS — F419 Anxiety disorder, unspecified: Secondary | ICD-10-CM | POA: Diagnosis not present

## 2024-04-12 DIAGNOSIS — F419 Anxiety disorder, unspecified: Secondary | ICD-10-CM | POA: Diagnosis not present

## 2024-04-12 DIAGNOSIS — I10 Essential (primary) hypertension: Secondary | ICD-10-CM | POA: Diagnosis not present

## 2024-04-12 DIAGNOSIS — I69154 Hemiplegia and hemiparesis following nontraumatic intracerebral hemorrhage affecting left non-dominant side: Secondary | ICD-10-CM | POA: Diagnosis not present

## 2024-04-12 DIAGNOSIS — E119 Type 2 diabetes mellitus without complications: Secondary | ICD-10-CM | POA: Diagnosis not present

## 2024-04-12 DIAGNOSIS — Z9682 Presence of neurostimulator: Secondary | ICD-10-CM | POA: Diagnosis not present

## 2024-04-12 DIAGNOSIS — F32A Depression, unspecified: Secondary | ICD-10-CM | POA: Diagnosis not present

## 2024-04-12 DIAGNOSIS — Z87442 Personal history of urinary calculi: Secondary | ICD-10-CM | POA: Diagnosis not present

## 2024-04-12 DIAGNOSIS — Z85828 Personal history of other malignant neoplasm of skin: Secondary | ICD-10-CM | POA: Diagnosis not present

## 2024-04-12 DIAGNOSIS — K219 Gastro-esophageal reflux disease without esophagitis: Secondary | ICD-10-CM | POA: Diagnosis not present

## 2024-04-13 DIAGNOSIS — K219 Gastro-esophageal reflux disease without esophagitis: Secondary | ICD-10-CM | POA: Diagnosis not present

## 2024-04-13 DIAGNOSIS — Z87442 Personal history of urinary calculi: Secondary | ICD-10-CM | POA: Diagnosis not present

## 2024-04-13 DIAGNOSIS — F32A Depression, unspecified: Secondary | ICD-10-CM | POA: Diagnosis not present

## 2024-04-13 DIAGNOSIS — E119 Type 2 diabetes mellitus without complications: Secondary | ICD-10-CM | POA: Diagnosis not present

## 2024-04-13 DIAGNOSIS — Z85828 Personal history of other malignant neoplasm of skin: Secondary | ICD-10-CM | POA: Diagnosis not present

## 2024-04-13 DIAGNOSIS — I69154 Hemiplegia and hemiparesis following nontraumatic intracerebral hemorrhage affecting left non-dominant side: Secondary | ICD-10-CM | POA: Diagnosis not present

## 2024-04-13 DIAGNOSIS — Z9682 Presence of neurostimulator: Secondary | ICD-10-CM | POA: Diagnosis not present

## 2024-04-13 DIAGNOSIS — F419 Anxiety disorder, unspecified: Secondary | ICD-10-CM | POA: Diagnosis not present

## 2024-04-13 DIAGNOSIS — I10 Essential (primary) hypertension: Secondary | ICD-10-CM | POA: Diagnosis not present

## 2024-04-14 DIAGNOSIS — Z85828 Personal history of other malignant neoplasm of skin: Secondary | ICD-10-CM | POA: Diagnosis not present

## 2024-04-14 DIAGNOSIS — I69154 Hemiplegia and hemiparesis following nontraumatic intracerebral hemorrhage affecting left non-dominant side: Secondary | ICD-10-CM | POA: Diagnosis not present

## 2024-04-14 DIAGNOSIS — E119 Type 2 diabetes mellitus without complications: Secondary | ICD-10-CM | POA: Diagnosis not present

## 2024-04-14 DIAGNOSIS — Z87442 Personal history of urinary calculi: Secondary | ICD-10-CM | POA: Diagnosis not present

## 2024-04-14 DIAGNOSIS — F419 Anxiety disorder, unspecified: Secondary | ICD-10-CM | POA: Diagnosis not present

## 2024-04-14 DIAGNOSIS — F32A Depression, unspecified: Secondary | ICD-10-CM | POA: Diagnosis not present

## 2024-04-14 DIAGNOSIS — Z9682 Presence of neurostimulator: Secondary | ICD-10-CM | POA: Diagnosis not present

## 2024-04-14 DIAGNOSIS — K219 Gastro-esophageal reflux disease without esophagitis: Secondary | ICD-10-CM | POA: Diagnosis not present

## 2024-04-14 DIAGNOSIS — I10 Essential (primary) hypertension: Secondary | ICD-10-CM | POA: Diagnosis not present

## 2024-04-15 DIAGNOSIS — F419 Anxiety disorder, unspecified: Secondary | ICD-10-CM | POA: Diagnosis not present

## 2024-04-15 DIAGNOSIS — Z85828 Personal history of other malignant neoplasm of skin: Secondary | ICD-10-CM | POA: Diagnosis not present

## 2024-04-15 DIAGNOSIS — Z9682 Presence of neurostimulator: Secondary | ICD-10-CM | POA: Diagnosis not present

## 2024-04-15 DIAGNOSIS — K219 Gastro-esophageal reflux disease without esophagitis: Secondary | ICD-10-CM | POA: Diagnosis not present

## 2024-04-15 DIAGNOSIS — Z87442 Personal history of urinary calculi: Secondary | ICD-10-CM | POA: Diagnosis not present

## 2024-04-15 DIAGNOSIS — I69154 Hemiplegia and hemiparesis following nontraumatic intracerebral hemorrhage affecting left non-dominant side: Secondary | ICD-10-CM | POA: Diagnosis not present

## 2024-04-15 DIAGNOSIS — I10 Essential (primary) hypertension: Secondary | ICD-10-CM | POA: Diagnosis not present

## 2024-04-15 DIAGNOSIS — F32A Depression, unspecified: Secondary | ICD-10-CM | POA: Diagnosis not present

## 2024-04-15 DIAGNOSIS — E119 Type 2 diabetes mellitus without complications: Secondary | ICD-10-CM | POA: Diagnosis not present

## 2024-04-19 DIAGNOSIS — I69154 Hemiplegia and hemiparesis following nontraumatic intracerebral hemorrhage affecting left non-dominant side: Secondary | ICD-10-CM | POA: Diagnosis not present

## 2024-04-19 DIAGNOSIS — E1122 Type 2 diabetes mellitus with diabetic chronic kidney disease: Secondary | ICD-10-CM | POA: Diagnosis not present

## 2024-04-19 DIAGNOSIS — Z87891 Personal history of nicotine dependence: Secondary | ICD-10-CM | POA: Diagnosis not present

## 2024-04-19 DIAGNOSIS — Z85828 Personal history of other malignant neoplasm of skin: Secondary | ICD-10-CM | POA: Diagnosis not present

## 2024-04-19 DIAGNOSIS — Z87442 Personal history of urinary calculi: Secondary | ICD-10-CM | POA: Diagnosis not present

## 2024-04-19 DIAGNOSIS — K219 Gastro-esophageal reflux disease without esophagitis: Secondary | ICD-10-CM | POA: Diagnosis not present

## 2024-04-19 DIAGNOSIS — Z0001 Encounter for general adult medical examination with abnormal findings: Secondary | ICD-10-CM | POA: Diagnosis not present

## 2024-04-19 DIAGNOSIS — I69354 Hemiplegia and hemiparesis following cerebral infarction affecting left non-dominant side: Secondary | ICD-10-CM | POA: Diagnosis not present

## 2024-04-19 DIAGNOSIS — F419 Anxiety disorder, unspecified: Secondary | ICD-10-CM | POA: Diagnosis not present

## 2024-04-19 DIAGNOSIS — Z9682 Presence of neurostimulator: Secondary | ICD-10-CM | POA: Diagnosis not present

## 2024-04-19 DIAGNOSIS — N1832 Chronic kidney disease, stage 3b: Secondary | ICD-10-CM | POA: Diagnosis not present

## 2024-04-19 DIAGNOSIS — E119 Type 2 diabetes mellitus without complications: Secondary | ICD-10-CM | POA: Diagnosis not present

## 2024-04-19 DIAGNOSIS — I10 Essential (primary) hypertension: Secondary | ICD-10-CM | POA: Diagnosis not present

## 2024-04-19 DIAGNOSIS — I129 Hypertensive chronic kidney disease with stage 1 through stage 4 chronic kidney disease, or unspecified chronic kidney disease: Secondary | ICD-10-CM | POA: Diagnosis not present

## 2024-04-19 DIAGNOSIS — F32A Depression, unspecified: Secondary | ICD-10-CM | POA: Diagnosis not present

## 2024-04-19 DIAGNOSIS — Z0000 Encounter for general adult medical examination without abnormal findings: Secondary | ICD-10-CM | POA: Diagnosis not present

## 2024-04-19 DIAGNOSIS — Z1331 Encounter for screening for depression: Secondary | ICD-10-CM | POA: Diagnosis not present

## 2024-04-20 DIAGNOSIS — I69154 Hemiplegia and hemiparesis following nontraumatic intracerebral hemorrhage affecting left non-dominant side: Secondary | ICD-10-CM | POA: Diagnosis not present

## 2024-04-20 DIAGNOSIS — F32A Depression, unspecified: Secondary | ICD-10-CM | POA: Diagnosis not present

## 2024-04-20 DIAGNOSIS — Z87442 Personal history of urinary calculi: Secondary | ICD-10-CM | POA: Diagnosis not present

## 2024-04-20 DIAGNOSIS — I10 Essential (primary) hypertension: Secondary | ICD-10-CM | POA: Diagnosis not present

## 2024-04-20 DIAGNOSIS — E119 Type 2 diabetes mellitus without complications: Secondary | ICD-10-CM | POA: Diagnosis not present

## 2024-04-20 DIAGNOSIS — K219 Gastro-esophageal reflux disease without esophagitis: Secondary | ICD-10-CM | POA: Diagnosis not present

## 2024-04-20 DIAGNOSIS — Z9682 Presence of neurostimulator: Secondary | ICD-10-CM | POA: Diagnosis not present

## 2024-04-20 DIAGNOSIS — Z85828 Personal history of other malignant neoplasm of skin: Secondary | ICD-10-CM | POA: Diagnosis not present

## 2024-04-20 DIAGNOSIS — F419 Anxiety disorder, unspecified: Secondary | ICD-10-CM | POA: Diagnosis not present

## 2024-04-23 DIAGNOSIS — D485 Neoplasm of uncertain behavior of skin: Secondary | ICD-10-CM | POA: Diagnosis not present

## 2024-04-23 DIAGNOSIS — R58 Hemorrhage, not elsewhere classified: Secondary | ICD-10-CM | POA: Diagnosis not present

## 2024-04-23 DIAGNOSIS — R208 Other disturbances of skin sensation: Secondary | ICD-10-CM | POA: Diagnosis not present

## 2024-04-23 DIAGNOSIS — D2262 Melanocytic nevi of left upper limb, including shoulder: Secondary | ICD-10-CM | POA: Diagnosis not present

## 2024-04-23 DIAGNOSIS — L218 Other seborrheic dermatitis: Secondary | ICD-10-CM | POA: Diagnosis not present

## 2024-04-23 DIAGNOSIS — C44229 Squamous cell carcinoma of skin of left ear and external auricular canal: Secondary | ICD-10-CM | POA: Diagnosis not present

## 2024-04-23 DIAGNOSIS — D225 Melanocytic nevi of trunk: Secondary | ICD-10-CM | POA: Diagnosis not present

## 2024-04-23 DIAGNOSIS — Z85828 Personal history of other malignant neoplasm of skin: Secondary | ICD-10-CM | POA: Diagnosis not present

## 2024-04-23 DIAGNOSIS — D2261 Melanocytic nevi of right upper limb, including shoulder: Secondary | ICD-10-CM | POA: Diagnosis not present

## 2024-04-24 DIAGNOSIS — E119 Type 2 diabetes mellitus without complications: Secondary | ICD-10-CM | POA: Diagnosis not present

## 2024-04-24 DIAGNOSIS — I69154 Hemiplegia and hemiparesis following nontraumatic intracerebral hemorrhage affecting left non-dominant side: Secondary | ICD-10-CM | POA: Diagnosis not present

## 2024-04-24 DIAGNOSIS — F32A Depression, unspecified: Secondary | ICD-10-CM | POA: Diagnosis not present

## 2024-04-24 DIAGNOSIS — K219 Gastro-esophageal reflux disease without esophagitis: Secondary | ICD-10-CM | POA: Diagnosis not present

## 2024-04-24 DIAGNOSIS — I10 Essential (primary) hypertension: Secondary | ICD-10-CM | POA: Diagnosis not present

## 2024-04-24 DIAGNOSIS — Z9682 Presence of neurostimulator: Secondary | ICD-10-CM | POA: Diagnosis not present

## 2024-04-24 DIAGNOSIS — F419 Anxiety disorder, unspecified: Secondary | ICD-10-CM | POA: Diagnosis not present

## 2024-04-24 DIAGNOSIS — Z87442 Personal history of urinary calculi: Secondary | ICD-10-CM | POA: Diagnosis not present

## 2024-04-24 DIAGNOSIS — Z85828 Personal history of other malignant neoplasm of skin: Secondary | ICD-10-CM | POA: Diagnosis not present

## 2024-04-26 DIAGNOSIS — E119 Type 2 diabetes mellitus without complications: Secondary | ICD-10-CM | POA: Diagnosis not present

## 2024-04-26 DIAGNOSIS — Z85828 Personal history of other malignant neoplasm of skin: Secondary | ICD-10-CM | POA: Diagnosis not present

## 2024-04-26 DIAGNOSIS — F32A Depression, unspecified: Secondary | ICD-10-CM | POA: Diagnosis not present

## 2024-04-26 DIAGNOSIS — F419 Anxiety disorder, unspecified: Secondary | ICD-10-CM | POA: Diagnosis not present

## 2024-04-26 DIAGNOSIS — I10 Essential (primary) hypertension: Secondary | ICD-10-CM | POA: Diagnosis not present

## 2024-04-26 DIAGNOSIS — K219 Gastro-esophageal reflux disease without esophagitis: Secondary | ICD-10-CM | POA: Diagnosis not present

## 2024-04-26 DIAGNOSIS — Z9682 Presence of neurostimulator: Secondary | ICD-10-CM | POA: Diagnosis not present

## 2024-04-26 DIAGNOSIS — Z87442 Personal history of urinary calculi: Secondary | ICD-10-CM | POA: Diagnosis not present

## 2024-04-26 DIAGNOSIS — I69154 Hemiplegia and hemiparesis following nontraumatic intracerebral hemorrhage affecting left non-dominant side: Secondary | ICD-10-CM | POA: Diagnosis not present

## 2024-04-28 DIAGNOSIS — Z9682 Presence of neurostimulator: Secondary | ICD-10-CM | POA: Diagnosis not present

## 2024-04-28 DIAGNOSIS — I69154 Hemiplegia and hemiparesis following nontraumatic intracerebral hemorrhage affecting left non-dominant side: Secondary | ICD-10-CM | POA: Diagnosis not present

## 2024-04-28 DIAGNOSIS — F419 Anxiety disorder, unspecified: Secondary | ICD-10-CM | POA: Diagnosis not present

## 2024-04-28 DIAGNOSIS — I10 Essential (primary) hypertension: Secondary | ICD-10-CM | POA: Diagnosis not present

## 2024-04-28 DIAGNOSIS — K219 Gastro-esophageal reflux disease without esophagitis: Secondary | ICD-10-CM | POA: Diagnosis not present

## 2024-04-28 DIAGNOSIS — Z87442 Personal history of urinary calculi: Secondary | ICD-10-CM | POA: Diagnosis not present

## 2024-04-28 DIAGNOSIS — E119 Type 2 diabetes mellitus without complications: Secondary | ICD-10-CM | POA: Diagnosis not present

## 2024-04-28 DIAGNOSIS — F32A Depression, unspecified: Secondary | ICD-10-CM | POA: Diagnosis not present

## 2024-04-28 DIAGNOSIS — Z85828 Personal history of other malignant neoplasm of skin: Secondary | ICD-10-CM | POA: Diagnosis not present

## 2024-05-03 DIAGNOSIS — K219 Gastro-esophageal reflux disease without esophagitis: Secondary | ICD-10-CM | POA: Diagnosis not present

## 2024-05-03 DIAGNOSIS — E119 Type 2 diabetes mellitus without complications: Secondary | ICD-10-CM | POA: Diagnosis not present

## 2024-05-03 DIAGNOSIS — Z85828 Personal history of other malignant neoplasm of skin: Secondary | ICD-10-CM | POA: Diagnosis not present

## 2024-05-03 DIAGNOSIS — Z9682 Presence of neurostimulator: Secondary | ICD-10-CM | POA: Diagnosis not present

## 2024-05-03 DIAGNOSIS — F419 Anxiety disorder, unspecified: Secondary | ICD-10-CM | POA: Diagnosis not present

## 2024-05-03 DIAGNOSIS — I69154 Hemiplegia and hemiparesis following nontraumatic intracerebral hemorrhage affecting left non-dominant side: Secondary | ICD-10-CM | POA: Diagnosis not present

## 2024-05-03 DIAGNOSIS — Z87442 Personal history of urinary calculi: Secondary | ICD-10-CM | POA: Diagnosis not present

## 2024-05-03 DIAGNOSIS — F32A Depression, unspecified: Secondary | ICD-10-CM | POA: Diagnosis not present

## 2024-05-03 DIAGNOSIS — I10 Essential (primary) hypertension: Secondary | ICD-10-CM | POA: Diagnosis not present

## 2024-05-04 DIAGNOSIS — K219 Gastro-esophageal reflux disease without esophagitis: Secondary | ICD-10-CM | POA: Diagnosis not present

## 2024-05-04 DIAGNOSIS — F32A Depression, unspecified: Secondary | ICD-10-CM | POA: Diagnosis not present

## 2024-05-04 DIAGNOSIS — F419 Anxiety disorder, unspecified: Secondary | ICD-10-CM | POA: Diagnosis not present

## 2024-05-04 DIAGNOSIS — E119 Type 2 diabetes mellitus without complications: Secondary | ICD-10-CM | POA: Diagnosis not present

## 2024-05-04 DIAGNOSIS — I10 Essential (primary) hypertension: Secondary | ICD-10-CM | POA: Diagnosis not present

## 2024-05-04 DIAGNOSIS — Z87442 Personal history of urinary calculi: Secondary | ICD-10-CM | POA: Diagnosis not present

## 2024-05-04 DIAGNOSIS — Z85828 Personal history of other malignant neoplasm of skin: Secondary | ICD-10-CM | POA: Diagnosis not present

## 2024-05-04 DIAGNOSIS — I69154 Hemiplegia and hemiparesis following nontraumatic intracerebral hemorrhage affecting left non-dominant side: Secondary | ICD-10-CM | POA: Diagnosis not present

## 2024-05-04 DIAGNOSIS — Z9682 Presence of neurostimulator: Secondary | ICD-10-CM | POA: Diagnosis not present

## 2024-05-13 DIAGNOSIS — I69154 Hemiplegia and hemiparesis following nontraumatic intracerebral hemorrhage affecting left non-dominant side: Secondary | ICD-10-CM | POA: Diagnosis not present

## 2024-05-13 DIAGNOSIS — Z85828 Personal history of other malignant neoplasm of skin: Secondary | ICD-10-CM | POA: Diagnosis not present

## 2024-05-13 DIAGNOSIS — F32A Depression, unspecified: Secondary | ICD-10-CM | POA: Diagnosis not present

## 2024-05-13 DIAGNOSIS — I10 Essential (primary) hypertension: Secondary | ICD-10-CM | POA: Diagnosis not present

## 2024-05-13 DIAGNOSIS — F419 Anxiety disorder, unspecified: Secondary | ICD-10-CM | POA: Diagnosis not present

## 2024-05-13 DIAGNOSIS — E119 Type 2 diabetes mellitus without complications: Secondary | ICD-10-CM | POA: Diagnosis not present

## 2024-05-13 DIAGNOSIS — Z87442 Personal history of urinary calculi: Secondary | ICD-10-CM | POA: Diagnosis not present

## 2024-05-13 DIAGNOSIS — K219 Gastro-esophageal reflux disease without esophagitis: Secondary | ICD-10-CM | POA: Diagnosis not present

## 2024-05-13 DIAGNOSIS — Z9682 Presence of neurostimulator: Secondary | ICD-10-CM | POA: Diagnosis not present

## 2024-05-19 DIAGNOSIS — M6250 Muscle wasting and atrophy, not elsewhere classified, unspecified site: Secondary | ICD-10-CM | POA: Diagnosis not present

## 2024-05-19 DIAGNOSIS — K2100 Gastro-esophageal reflux disease with esophagitis, without bleeding: Secondary | ICD-10-CM | POA: Diagnosis not present

## 2024-05-19 DIAGNOSIS — E440 Moderate protein-calorie malnutrition: Secondary | ICD-10-CM | POA: Diagnosis not present

## 2024-05-19 DIAGNOSIS — I69154 Hemiplegia and hemiparesis following nontraumatic intracerebral hemorrhage affecting left non-dominant side: Secondary | ICD-10-CM | POA: Diagnosis not present

## 2024-05-19 DIAGNOSIS — F419 Anxiety disorder, unspecified: Secondary | ICD-10-CM | POA: Diagnosis not present

## 2024-05-19 DIAGNOSIS — F32A Depression, unspecified: Secondary | ICD-10-CM | POA: Diagnosis not present

## 2024-05-19 DIAGNOSIS — I10 Essential (primary) hypertension: Secondary | ICD-10-CM | POA: Diagnosis not present

## 2024-05-19 DIAGNOSIS — E119 Type 2 diabetes mellitus without complications: Secondary | ICD-10-CM | POA: Diagnosis not present

## 2024-05-19 DIAGNOSIS — R1312 Dysphagia, oropharyngeal phase: Secondary | ICD-10-CM | POA: Diagnosis not present

## 2024-05-20 DIAGNOSIS — R197 Diarrhea, unspecified: Secondary | ICD-10-CM | POA: Diagnosis not present

## 2024-05-24 DIAGNOSIS — R197 Diarrhea, unspecified: Secondary | ICD-10-CM | POA: Diagnosis not present

## 2024-05-27 DIAGNOSIS — R1312 Dysphagia, oropharyngeal phase: Secondary | ICD-10-CM | POA: Diagnosis not present

## 2024-05-27 DIAGNOSIS — K2100 Gastro-esophageal reflux disease with esophagitis, without bleeding: Secondary | ICD-10-CM | POA: Diagnosis not present

## 2024-05-27 DIAGNOSIS — F32A Depression, unspecified: Secondary | ICD-10-CM | POA: Diagnosis not present

## 2024-05-27 DIAGNOSIS — M6250 Muscle wasting and atrophy, not elsewhere classified, unspecified site: Secondary | ICD-10-CM | POA: Diagnosis not present

## 2024-05-27 DIAGNOSIS — I10 Essential (primary) hypertension: Secondary | ICD-10-CM | POA: Diagnosis not present

## 2024-05-27 DIAGNOSIS — I69154 Hemiplegia and hemiparesis following nontraumatic intracerebral hemorrhage affecting left non-dominant side: Secondary | ICD-10-CM | POA: Diagnosis not present

## 2024-05-27 DIAGNOSIS — E119 Type 2 diabetes mellitus without complications: Secondary | ICD-10-CM | POA: Diagnosis not present

## 2024-05-27 DIAGNOSIS — F419 Anxiety disorder, unspecified: Secondary | ICD-10-CM | POA: Diagnosis not present

## 2024-05-27 DIAGNOSIS — E440 Moderate protein-calorie malnutrition: Secondary | ICD-10-CM | POA: Diagnosis not present

## 2024-05-29 DIAGNOSIS — K2100 Gastro-esophageal reflux disease with esophagitis, without bleeding: Secondary | ICD-10-CM | POA: Diagnosis not present

## 2024-05-29 DIAGNOSIS — F32A Depression, unspecified: Secondary | ICD-10-CM | POA: Diagnosis not present

## 2024-05-29 DIAGNOSIS — F419 Anxiety disorder, unspecified: Secondary | ICD-10-CM | POA: Diagnosis not present

## 2024-05-29 DIAGNOSIS — E440 Moderate protein-calorie malnutrition: Secondary | ICD-10-CM | POA: Diagnosis not present

## 2024-05-29 DIAGNOSIS — R1312 Dysphagia, oropharyngeal phase: Secondary | ICD-10-CM | POA: Diagnosis not present

## 2024-05-29 DIAGNOSIS — I69154 Hemiplegia and hemiparesis following nontraumatic intracerebral hemorrhage affecting left non-dominant side: Secondary | ICD-10-CM | POA: Diagnosis not present

## 2024-05-29 DIAGNOSIS — M6250 Muscle wasting and atrophy, not elsewhere classified, unspecified site: Secondary | ICD-10-CM | POA: Diagnosis not present

## 2024-05-29 DIAGNOSIS — I10 Essential (primary) hypertension: Secondary | ICD-10-CM | POA: Diagnosis not present

## 2024-05-29 DIAGNOSIS — E119 Type 2 diabetes mellitus without complications: Secondary | ICD-10-CM | POA: Diagnosis not present

## 2024-06-02 DIAGNOSIS — F32A Depression, unspecified: Secondary | ICD-10-CM | POA: Diagnosis not present

## 2024-06-02 DIAGNOSIS — I69154 Hemiplegia and hemiparesis following nontraumatic intracerebral hemorrhage affecting left non-dominant side: Secondary | ICD-10-CM | POA: Diagnosis not present

## 2024-06-02 DIAGNOSIS — I10 Essential (primary) hypertension: Secondary | ICD-10-CM | POA: Diagnosis not present

## 2024-06-02 DIAGNOSIS — K2100 Gastro-esophageal reflux disease with esophagitis, without bleeding: Secondary | ICD-10-CM | POA: Diagnosis not present

## 2024-06-02 DIAGNOSIS — F419 Anxiety disorder, unspecified: Secondary | ICD-10-CM | POA: Diagnosis not present

## 2024-06-02 DIAGNOSIS — E119 Type 2 diabetes mellitus without complications: Secondary | ICD-10-CM | POA: Diagnosis not present

## 2024-06-02 DIAGNOSIS — E440 Moderate protein-calorie malnutrition: Secondary | ICD-10-CM | POA: Diagnosis not present

## 2024-06-02 DIAGNOSIS — M6250 Muscle wasting and atrophy, not elsewhere classified, unspecified site: Secondary | ICD-10-CM | POA: Diagnosis not present

## 2024-06-02 DIAGNOSIS — R1312 Dysphagia, oropharyngeal phase: Secondary | ICD-10-CM | POA: Diagnosis not present

## 2024-06-03 DIAGNOSIS — F419 Anxiety disorder, unspecified: Secondary | ICD-10-CM | POA: Diagnosis not present

## 2024-06-03 DIAGNOSIS — E119 Type 2 diabetes mellitus without complications: Secondary | ICD-10-CM | POA: Diagnosis not present

## 2024-06-03 DIAGNOSIS — K2100 Gastro-esophageal reflux disease with esophagitis, without bleeding: Secondary | ICD-10-CM | POA: Diagnosis not present

## 2024-06-03 DIAGNOSIS — M6250 Muscle wasting and atrophy, not elsewhere classified, unspecified site: Secondary | ICD-10-CM | POA: Diagnosis not present

## 2024-06-03 DIAGNOSIS — R1312 Dysphagia, oropharyngeal phase: Secondary | ICD-10-CM | POA: Diagnosis not present

## 2024-06-03 DIAGNOSIS — I69154 Hemiplegia and hemiparesis following nontraumatic intracerebral hemorrhage affecting left non-dominant side: Secondary | ICD-10-CM | POA: Diagnosis not present

## 2024-06-03 DIAGNOSIS — I10 Essential (primary) hypertension: Secondary | ICD-10-CM | POA: Diagnosis not present

## 2024-06-03 DIAGNOSIS — F32A Depression, unspecified: Secondary | ICD-10-CM | POA: Diagnosis not present

## 2024-06-03 DIAGNOSIS — E440 Moderate protein-calorie malnutrition: Secondary | ICD-10-CM | POA: Diagnosis not present

## 2024-06-10 DIAGNOSIS — F419 Anxiety disorder, unspecified: Secondary | ICD-10-CM | POA: Diagnosis not present

## 2024-06-10 DIAGNOSIS — E119 Type 2 diabetes mellitus without complications: Secondary | ICD-10-CM | POA: Diagnosis not present

## 2024-06-10 DIAGNOSIS — R1312 Dysphagia, oropharyngeal phase: Secondary | ICD-10-CM | POA: Diagnosis not present

## 2024-06-10 DIAGNOSIS — K2100 Gastro-esophageal reflux disease with esophagitis, without bleeding: Secondary | ICD-10-CM | POA: Diagnosis not present

## 2024-06-10 DIAGNOSIS — F32A Depression, unspecified: Secondary | ICD-10-CM | POA: Diagnosis not present

## 2024-06-10 DIAGNOSIS — E440 Moderate protein-calorie malnutrition: Secondary | ICD-10-CM | POA: Diagnosis not present

## 2024-06-10 DIAGNOSIS — I69154 Hemiplegia and hemiparesis following nontraumatic intracerebral hemorrhage affecting left non-dominant side: Secondary | ICD-10-CM | POA: Diagnosis not present

## 2024-06-10 DIAGNOSIS — M6250 Muscle wasting and atrophy, not elsewhere classified, unspecified site: Secondary | ICD-10-CM | POA: Diagnosis not present

## 2024-06-10 DIAGNOSIS — I10 Essential (primary) hypertension: Secondary | ICD-10-CM | POA: Diagnosis not present

## 2024-06-12 DIAGNOSIS — E440 Moderate protein-calorie malnutrition: Secondary | ICD-10-CM | POA: Diagnosis not present

## 2024-06-12 DIAGNOSIS — F32A Depression, unspecified: Secondary | ICD-10-CM | POA: Diagnosis not present

## 2024-06-12 DIAGNOSIS — E119 Type 2 diabetes mellitus without complications: Secondary | ICD-10-CM | POA: Diagnosis not present

## 2024-06-12 DIAGNOSIS — I69154 Hemiplegia and hemiparesis following nontraumatic intracerebral hemorrhage affecting left non-dominant side: Secondary | ICD-10-CM | POA: Diagnosis not present

## 2024-06-12 DIAGNOSIS — F419 Anxiety disorder, unspecified: Secondary | ICD-10-CM | POA: Diagnosis not present

## 2024-06-12 DIAGNOSIS — M6250 Muscle wasting and atrophy, not elsewhere classified, unspecified site: Secondary | ICD-10-CM | POA: Diagnosis not present

## 2024-06-12 DIAGNOSIS — K2100 Gastro-esophageal reflux disease with esophagitis, without bleeding: Secondary | ICD-10-CM | POA: Diagnosis not present

## 2024-06-12 DIAGNOSIS — R1312 Dysphagia, oropharyngeal phase: Secondary | ICD-10-CM | POA: Diagnosis not present

## 2024-06-12 DIAGNOSIS — I10 Essential (primary) hypertension: Secondary | ICD-10-CM | POA: Diagnosis not present

## 2024-06-16 DIAGNOSIS — E440 Moderate protein-calorie malnutrition: Secondary | ICD-10-CM | POA: Diagnosis not present

## 2024-06-16 DIAGNOSIS — M6250 Muscle wasting and atrophy, not elsewhere classified, unspecified site: Secondary | ICD-10-CM | POA: Diagnosis not present

## 2024-06-16 DIAGNOSIS — E119 Type 2 diabetes mellitus without complications: Secondary | ICD-10-CM | POA: Diagnosis not present

## 2024-06-16 DIAGNOSIS — I69154 Hemiplegia and hemiparesis following nontraumatic intracerebral hemorrhage affecting left non-dominant side: Secondary | ICD-10-CM | POA: Diagnosis not present

## 2024-06-16 DIAGNOSIS — F419 Anxiety disorder, unspecified: Secondary | ICD-10-CM | POA: Diagnosis not present

## 2024-06-16 DIAGNOSIS — F32A Depression, unspecified: Secondary | ICD-10-CM | POA: Diagnosis not present

## 2024-06-16 DIAGNOSIS — K2100 Gastro-esophageal reflux disease with esophagitis, without bleeding: Secondary | ICD-10-CM | POA: Diagnosis not present

## 2024-06-16 DIAGNOSIS — I10 Essential (primary) hypertension: Secondary | ICD-10-CM | POA: Diagnosis not present

## 2024-06-16 DIAGNOSIS — R1312 Dysphagia, oropharyngeal phase: Secondary | ICD-10-CM | POA: Diagnosis not present

## 2024-06-18 DIAGNOSIS — R1312 Dysphagia, oropharyngeal phase: Secondary | ICD-10-CM | POA: Diagnosis not present

## 2024-06-18 DIAGNOSIS — E119 Type 2 diabetes mellitus without complications: Secondary | ICD-10-CM | POA: Diagnosis not present

## 2024-06-18 DIAGNOSIS — K2100 Gastro-esophageal reflux disease with esophagitis, without bleeding: Secondary | ICD-10-CM | POA: Diagnosis not present

## 2024-06-18 DIAGNOSIS — I69154 Hemiplegia and hemiparesis following nontraumatic intracerebral hemorrhage affecting left non-dominant side: Secondary | ICD-10-CM | POA: Diagnosis not present

## 2024-06-18 DIAGNOSIS — F32A Depression, unspecified: Secondary | ICD-10-CM | POA: Diagnosis not present

## 2024-06-18 DIAGNOSIS — M6250 Muscle wasting and atrophy, not elsewhere classified, unspecified site: Secondary | ICD-10-CM | POA: Diagnosis not present

## 2024-06-18 DIAGNOSIS — F419 Anxiety disorder, unspecified: Secondary | ICD-10-CM | POA: Diagnosis not present

## 2024-06-18 DIAGNOSIS — I10 Essential (primary) hypertension: Secondary | ICD-10-CM | POA: Diagnosis not present

## 2024-06-18 DIAGNOSIS — E440 Moderate protein-calorie malnutrition: Secondary | ICD-10-CM | POA: Diagnosis not present

## 2024-06-24 DIAGNOSIS — E440 Moderate protein-calorie malnutrition: Secondary | ICD-10-CM | POA: Diagnosis not present

## 2024-06-24 DIAGNOSIS — I10 Essential (primary) hypertension: Secondary | ICD-10-CM | POA: Diagnosis not present

## 2024-06-24 DIAGNOSIS — F32A Depression, unspecified: Secondary | ICD-10-CM | POA: Diagnosis not present

## 2024-06-24 DIAGNOSIS — M6250 Muscle wasting and atrophy, not elsewhere classified, unspecified site: Secondary | ICD-10-CM | POA: Diagnosis not present

## 2024-06-24 DIAGNOSIS — K2100 Gastro-esophageal reflux disease with esophagitis, without bleeding: Secondary | ICD-10-CM | POA: Diagnosis not present

## 2024-06-24 DIAGNOSIS — I69154 Hemiplegia and hemiparesis following nontraumatic intracerebral hemorrhage affecting left non-dominant side: Secondary | ICD-10-CM | POA: Diagnosis not present

## 2024-06-24 DIAGNOSIS — F419 Anxiety disorder, unspecified: Secondary | ICD-10-CM | POA: Diagnosis not present

## 2024-06-24 DIAGNOSIS — R1312 Dysphagia, oropharyngeal phase: Secondary | ICD-10-CM | POA: Diagnosis not present

## 2024-06-24 DIAGNOSIS — E119 Type 2 diabetes mellitus without complications: Secondary | ICD-10-CM | POA: Diagnosis not present

## 2024-06-30 DIAGNOSIS — M6250 Muscle wasting and atrophy, not elsewhere classified, unspecified site: Secondary | ICD-10-CM | POA: Diagnosis not present

## 2024-06-30 DIAGNOSIS — K2100 Gastro-esophageal reflux disease with esophagitis, without bleeding: Secondary | ICD-10-CM | POA: Diagnosis not present

## 2024-06-30 DIAGNOSIS — I10 Essential (primary) hypertension: Secondary | ICD-10-CM | POA: Diagnosis not present

## 2024-06-30 DIAGNOSIS — E119 Type 2 diabetes mellitus without complications: Secondary | ICD-10-CM | POA: Diagnosis not present

## 2024-06-30 DIAGNOSIS — R1312 Dysphagia, oropharyngeal phase: Secondary | ICD-10-CM | POA: Diagnosis not present

## 2024-06-30 DIAGNOSIS — F32A Depression, unspecified: Secondary | ICD-10-CM | POA: Diagnosis not present

## 2024-06-30 DIAGNOSIS — F419 Anxiety disorder, unspecified: Secondary | ICD-10-CM | POA: Diagnosis not present

## 2024-06-30 DIAGNOSIS — I69154 Hemiplegia and hemiparesis following nontraumatic intracerebral hemorrhage affecting left non-dominant side: Secondary | ICD-10-CM | POA: Diagnosis not present

## 2024-06-30 DIAGNOSIS — E440 Moderate protein-calorie malnutrition: Secondary | ICD-10-CM | POA: Diagnosis not present

## 2024-07-01 DIAGNOSIS — R197 Diarrhea, unspecified: Secondary | ICD-10-CM | POA: Diagnosis not present

## 2024-07-07 DIAGNOSIS — F32A Depression, unspecified: Secondary | ICD-10-CM | POA: Diagnosis not present

## 2024-07-07 DIAGNOSIS — E440 Moderate protein-calorie malnutrition: Secondary | ICD-10-CM | POA: Diagnosis not present

## 2024-07-07 DIAGNOSIS — R1312 Dysphagia, oropharyngeal phase: Secondary | ICD-10-CM | POA: Diagnosis not present

## 2024-07-07 DIAGNOSIS — I10 Essential (primary) hypertension: Secondary | ICD-10-CM | POA: Diagnosis not present

## 2024-07-07 DIAGNOSIS — E119 Type 2 diabetes mellitus without complications: Secondary | ICD-10-CM | POA: Diagnosis not present

## 2024-07-07 DIAGNOSIS — F419 Anxiety disorder, unspecified: Secondary | ICD-10-CM | POA: Diagnosis not present

## 2024-07-07 DIAGNOSIS — M6250 Muscle wasting and atrophy, not elsewhere classified, unspecified site: Secondary | ICD-10-CM | POA: Diagnosis not present

## 2024-07-07 DIAGNOSIS — I69154 Hemiplegia and hemiparesis following nontraumatic intracerebral hemorrhage affecting left non-dominant side: Secondary | ICD-10-CM | POA: Diagnosis not present

## 2024-07-07 DIAGNOSIS — K2100 Gastro-esophageal reflux disease with esophagitis, without bleeding: Secondary | ICD-10-CM | POA: Diagnosis not present

## 2024-10-06 ENCOUNTER — Other Ambulatory Visit: Payer: Self-pay | Admitting: Adult Health

## 2024-12-07 ENCOUNTER — Inpatient Hospital Stay
Admission: EM | Admit: 2024-12-07 | Discharge: 2024-12-11 | DRG: 690 | Disposition: A | Discharge status: Home Health | Attending: Internal Medicine | Admitting: Internal Medicine

## 2024-12-07 DIAGNOSIS — N183 Chronic kidney disease, stage 3 unspecified: Secondary | ICD-10-CM | POA: Diagnosis present

## 2024-12-07 DIAGNOSIS — F329 Major depressive disorder, single episode, unspecified: Secondary | ICD-10-CM | POA: Diagnosis present

## 2024-12-07 DIAGNOSIS — Z1152 Encounter for screening for COVID-19: Secondary | ICD-10-CM | POA: Diagnosis not present

## 2024-12-07 DIAGNOSIS — F419 Anxiety disorder, unspecified: Secondary | ICD-10-CM | POA: Diagnosis present

## 2024-12-07 DIAGNOSIS — Z961 Presence of intraocular lens: Secondary | ICD-10-CM | POA: Diagnosis present

## 2024-12-07 DIAGNOSIS — E119 Type 2 diabetes mellitus without complications: Secondary | ICD-10-CM

## 2024-12-07 DIAGNOSIS — Z7982 Long term (current) use of aspirin: Secondary | ICD-10-CM

## 2024-12-07 DIAGNOSIS — Z9682 Presence of neurostimulator: Secondary | ICD-10-CM | POA: Diagnosis not present

## 2024-12-07 DIAGNOSIS — B964 Proteus (mirabilis) (morganii) as the cause of diseases classified elsewhere: Secondary | ICD-10-CM | POA: Diagnosis present

## 2024-12-07 DIAGNOSIS — I10 Essential (primary) hypertension: Secondary | ICD-10-CM | POA: Diagnosis present

## 2024-12-07 DIAGNOSIS — Z7984 Long term (current) use of oral hypoglycemic drugs: Secondary | ICD-10-CM

## 2024-12-07 DIAGNOSIS — Z87442 Personal history of urinary calculi: Secondary | ICD-10-CM | POA: Diagnosis not present

## 2024-12-07 DIAGNOSIS — N390 Urinary tract infection, site not specified: Secondary | ICD-10-CM | POA: Diagnosis present

## 2024-12-07 DIAGNOSIS — I129 Hypertensive chronic kidney disease with stage 1 through stage 4 chronic kidney disease, or unspecified chronic kidney disease: Secondary | ICD-10-CM | POA: Diagnosis present

## 2024-12-07 DIAGNOSIS — Z85828 Personal history of other malignant neoplasm of skin: Secondary | ICD-10-CM

## 2024-12-07 DIAGNOSIS — E1122 Type 2 diabetes mellitus with diabetic chronic kidney disease: Secondary | ICD-10-CM | POA: Diagnosis present

## 2024-12-07 DIAGNOSIS — Z87891 Personal history of nicotine dependence: Secondary | ICD-10-CM | POA: Diagnosis not present

## 2024-12-07 DIAGNOSIS — N1831 Chronic kidney disease, stage 3a: Secondary | ICD-10-CM | POA: Diagnosis present

## 2024-12-07 DIAGNOSIS — N401 Enlarged prostate with lower urinary tract symptoms: Secondary | ICD-10-CM | POA: Diagnosis present

## 2024-12-07 DIAGNOSIS — E785 Hyperlipidemia, unspecified: Secondary | ICD-10-CM | POA: Diagnosis present

## 2024-12-07 DIAGNOSIS — K219 Gastro-esophageal reflux disease without esophagitis: Secondary | ICD-10-CM | POA: Diagnosis present

## 2024-12-07 DIAGNOSIS — Z9842 Cataract extraction status, left eye: Secondary | ICD-10-CM | POA: Diagnosis not present

## 2024-12-07 DIAGNOSIS — Z9841 Cataract extraction status, right eye: Secondary | ICD-10-CM

## 2024-12-07 DIAGNOSIS — E1165 Type 2 diabetes mellitus with hyperglycemia: Secondary | ICD-10-CM | POA: Diagnosis present

## 2024-12-07 DIAGNOSIS — N3000 Acute cystitis without hematuria: Secondary | ICD-10-CM | POA: Diagnosis present

## 2024-12-07 DIAGNOSIS — Z96611 Presence of right artificial shoulder joint: Secondary | ICD-10-CM | POA: Diagnosis present

## 2024-12-07 DIAGNOSIS — Z79899 Other long term (current) drug therapy: Secondary | ICD-10-CM

## 2024-12-07 DIAGNOSIS — N179 Acute kidney failure, unspecified: Secondary | ICD-10-CM | POA: Diagnosis present

## 2024-12-07 DIAGNOSIS — Z794 Long term (current) use of insulin: Secondary | ICD-10-CM

## 2024-12-07 DIAGNOSIS — I69354 Hemiplegia and hemiparesis following cerebral infarction affecting left non-dominant side: Secondary | ICD-10-CM | POA: Diagnosis not present

## 2024-12-07 LAB — CBC WITH DIFFERENTIAL/PLATELET
Abs Immature Granulocytes: 0.04 K/uL (ref 0.00–0.07)
Basophils Absolute: 0 K/uL (ref 0.0–0.1)
Basophils Relative: 0 %
Eosinophils Absolute: 0 K/uL (ref 0.0–0.5)
Eosinophils Relative: 0 %
HCT: 32.7 % — ABNORMAL LOW (ref 39.0–52.0)
Hemoglobin: 10.9 g/dL — ABNORMAL LOW (ref 13.0–17.0)
Immature Granulocytes: 0 %
Lymphocytes Relative: 8 %
Lymphs Abs: 0.8 K/uL (ref 0.7–4.0)
MCH: 31.3 pg (ref 26.0–34.0)
MCHC: 33.3 g/dL (ref 30.0–36.0)
MCV: 94 fL (ref 80.0–100.0)
Monocytes Absolute: 1.2 K/uL — ABNORMAL HIGH (ref 0.1–1.0)
Monocytes Relative: 13 %
Neutro Abs: 7.7 K/uL (ref 1.7–7.7)
Neutrophils Relative %: 79 %
Platelets: 172 K/uL (ref 150–400)
RBC: 3.48 MIL/uL — ABNORMAL LOW (ref 4.22–5.81)
RDW: 12.5 % (ref 11.5–15.5)
WBC: 9.8 K/uL (ref 4.0–10.5)
nRBC: 0 % (ref 0.0–0.2)

## 2024-12-07 LAB — URINALYSIS, ROUTINE W REFLEX MICROSCOPIC
Bilirubin Urine: NEGATIVE
Glucose, UA: NEGATIVE mg/dL
Ketones, ur: NEGATIVE mg/dL
Nitrite: POSITIVE — AB
Protein, ur: 100 mg/dL — AB
Specific Gravity, Urine: 1.014 (ref 1.005–1.030)
WBC, UA: >50 WBC/hpf (ref 0–5)
pH: 7 (ref 5.0–8.0)

## 2024-12-07 LAB — BASIC METABOLIC PANEL WITH GFR
Anion gap: 12 (ref 5–15)
BUN: 48 mg/dL — ABNORMAL HIGH (ref 8–23)
CO2: 26 mmol/L (ref 22–32)
Calcium: 9.9 mg/dL (ref 8.9–10.3)
Chloride: 99 mmol/L (ref 98–111)
Creatinine, Ser: 2.02 mg/dL — ABNORMAL HIGH (ref 0.61–1.24)
GFR, Estimated: 33 mL/min — ABNORMAL LOW
Glucose, Bld: 161 mg/dL — ABNORMAL HIGH (ref 70–99)
Potassium: 4.9 mmol/L (ref 3.5–5.1)
Sodium: 137 mmol/L (ref 135–145)

## 2024-12-07 LAB — MAGNESIUM: Magnesium: 1.8 mg/dL (ref 1.7–2.4)

## 2024-12-07 LAB — RESP PANEL BY RT-PCR (RSV, FLU A&B, COVID)  RVPGX2
Influenza A by PCR: NEGATIVE
Influenza B by PCR: NEGATIVE
Resp Syncytial Virus by PCR: NEGATIVE
SARS Coronavirus 2 by RT PCR: NEGATIVE

## 2024-12-07 MED ORDER — ACETAMINOPHEN 650 MG RE SUPP: 650.0000 mg | Freq: Four times a day (QID) | RECTAL | Status: DC | PRN

## 2024-12-07 MED ORDER — ONDANSETRON HCL 4 MG/2ML IJ SOLN: 4.0000 mg | Freq: Four times a day (QID) | INTRAMUSCULAR | Status: DC | PRN

## 2024-12-07 MED ORDER — ONDANSETRON HCL 4 MG PO TABS: 4.0000 mg | ORAL_TABLET | Freq: Four times a day (QID) | ORAL | Status: DC | PRN

## 2024-12-07 MED ORDER — ACETAMINOPHEN 325 MG PO TABS
650.0000 mg | ORAL_TABLET | Freq: Four times a day (QID) | ORAL | Status: DC | PRN
  Administered 2024-12-08 – 2024-12-09 (×2): 650 mg via ORAL
  Filled 2024-12-07 (×2): qty 2

## 2024-12-07 MED ORDER — LACTATED RINGERS IV SOLN: INTRAVENOUS | Status: AC

## 2024-12-07 MED ORDER — LACTATED RINGERS IV BOLUS
1000.0000 mL | Freq: Once | INTRAVENOUS | Status: AC
  Administered 2024-12-07: 1000 mL via INTRAVENOUS

## 2024-12-07 MED ORDER — SODIUM CHLORIDE 0.9 % IV SOLN
1.0000 g | Freq: Once | INTRAVENOUS | Status: AC
  Administered 2024-12-07: 1 g via INTRAVENOUS
  Filled 2024-12-07: qty 10

## 2024-12-07 MED ORDER — SENNOSIDES-DOCUSATE SODIUM 8.6-50 MG PO TABS: 1.0000 | ORAL_TABLET | Freq: Every evening | ORAL | Status: DC | PRN

## 2024-12-07 MED ORDER — HEPARIN SODIUM (PORCINE) 5000 UNIT/ML IJ SOLN
5000.0000 [IU] | Freq: Three times a day (TID) | INTRAMUSCULAR | Status: DC
  Administered 2024-12-07 – 2024-12-11 (×12): 5000 [IU] via SUBCUTANEOUS
  Filled 2024-12-07 (×11): qty 1

## 2024-12-07 MED ORDER — SODIUM CHLORIDE 0.9% FLUSH
3.0000 mL | Freq: Two times a day (BID) | INTRAVENOUS | Status: DC
  Administered 2024-12-07 – 2024-12-11 (×6): 3 mL via INTRAVENOUS

## 2024-12-07 NOTE — ED Triage Notes (Signed)
 BIB [facility] from home. C/O one week history of increased urination and painful urination, weakness, and failure to thrive.  VS wnl. Sepsis screen negative. AOx1 - patient to baseline.  Lives at home with son.

## 2024-12-07 NOTE — ED Triage Notes (Signed)
 Pt reports feeling weak and lack of appetite. Pt concerned for UTI due to dysuria. Pt had stroke in Jan 2025 and wears an incontinence brief. Pt has been taking OTC medication for the urinary discomfort and says his urine is discolored from that.

## 2024-12-07 NOTE — H&P (Signed)
 " History and Physical    Herbert Chapman FMW:[id number] DOB: 30-May-1946 DOA: 12/07/2024  DOS: the patient was seen and examined on 12/07/2024  PCP: Herbert Norleen BIRCH, MD   Patient coming from: Home  I have personally briefly reviewed patient's old medical records in [facility] and CareEverywhere  HPI:   Herbert Chapman is a 78 y.o. year old male with medical history of hypertension, hyperlipidemia, type 2 diabetes, CKD 3A, BPH presenting to the ED with dysuria, weakness and malaise. States he has dysuria for one month. Has not seen PCP or received any abx for this. Denies any fevers or chills. States has poor intake and weakness since his stroke that have worsened over the the last month. On arrival to the ED patient was noted to be HDS stable.  Lab work and imaging obtained.  CBC without leukocytosis, mild anemia.  BMP with AKI, moderate hyperglycemia.  Respiratory panel negative for COVID, flu, RSV.  UA with signs of infection.  Urine culture ordered.  Patient started on IV antibiotics.  Given UTI and functional decline, TRH contacted for admission.  Review of Systems: As mentioned in the history of present illness. All other systems reviewed and are negative.   Past Medical History:  Diagnosis Date   Anxiety    Arthritis    Cancer (HCC)    squamous cell CA removed from skin   Depression    Diabetes mellitus without complication (HCC)    GERD (gastroesophageal reflux disease)    rare-no meds   Headache    h/o migraines   Hepatitis    Hep C-pt had injections and pill and pt states he does not have hep c anymore   History of kidney stones    h/o age 71   Hypertension    Mental disorder    Pancreatitis 2012   Personal history of tobacco use, presenting hazards to health 11/07/2015    Past Surgical History:  Procedure Laterality Date   CERVICAL SPINE SURGERY     CIRCUMCISION     COLONOSCOPY     EYE SURGERY     bilateral cataract removal; implants   LUMBAR  LAMINECTOMY/DECOMPRESSION MICRODISCECTOMY  10/07/2012   Procedure: LUMBAR LAMINECTOMY/DECOMPRESSION MICRODISCECTOMY 2 LEVELS;  Surgeon: Alm GORMAN Molt, MD;  Location: [facility];  Service: Neurosurgery;  Laterality: Left;  Left Lumbar two-three,lumbar four-five hemilaminectomy   PILONIDAL CYST EXCISION     SPINAL CORD STIMULATOR IMPLANT  2018   [employer]   TONSILLECTOMY     TOTAL SHOULDER ARTHROPLASTY Right 10/13/2019   Procedure: TOTAL SHOULDER ARTHROPLASTY;  Surgeon: Leora Lynwood SAUNDERS, MD;  Location: [facility] ORS;  Service: Orthopedics;  Laterality: Right;     Allergies[1]  No family history on file.  Prior to Admission medications  Medication Sig Start Date End Date Taking? Authorizing Provider  aspirin  EC 81 MG tablet Take 1 tablet (81 mg total) by mouth daily. Swallow whole. 03/23/24   Whitfield Raisin, NP  rosuvastatin  (CRESTOR ) 5 MG tablet Take 1 tablet (5 mg total) by mouth daily. 03/23/24   Whitfield Raisin, NP  acetaminophen  (TYLENOL ) 325 MG tablet Take 2 tablets (650 mg total) by mouth every 4 (four) hours as needed for mild pain (pain score 1-3) (or temp > 37.5 C (99.5 F)). 01/30/24   Sheikh, Omair Latif, DO  amLODipine  (NORVASC ) 10 MG tablet Take 1 tablet (10 mg total) by mouth daily. 01/31/24   Sherrill Cable Latif, DO  doxazosin  (CARDURA ) 4 MG tablet Take 4  mg by mouth at bedtime.    [provider]  famotidine  (PEPCID ) 20 MG tablet Take 1 tablet (20 mg total) by mouth 2 (two) times daily. 01/30/24   Sherrill Cable Latif, DO  FLUoxetine  (PROZAC ) 20 MG capsule Take 60 mg by mouth every morning.     [provider]  hydrALAZINE  (APRESOLINE ) 100 MG tablet Take 1 tablet (100 mg total) by mouth every 8 (eight) hours. 01/30/24   Sheikh, Omair Latif, DO  insulin  glargine-yfgn (SEMGLEE ) 100 UNIT/ML injection Inject 0.25 mLs (25 Units total) into the skin 2 (two) times daily. 01/30/24   Sherrill Cable Latif, DO  ipratropium-albuterol  (DUONEB) 0.5-2.5 (3) MG/3ML SOLN Take 3 mLs  by nebulization every 4 (four) hours as needed. 01/30/24   Sherrill Cable Latif, DO  losartan  (COZAAR ) 100 MG tablet Take 1 tablet (100 mg total) by mouth daily. 01/31/24   Sherrill Cable Latif, DO  metFORMIN  (GLUCOPHAGE ) 1000 MG tablet Take 1,000 mg by mouth 2 (two) times daily with a meal.    [provider]  metoprolol  tartrate (LOPRESSOR ) 100 MG tablet Take 1 tablet (100 mg total) by mouth 2 (two) times daily. 01/30/24   Sheikh, Omair Latif, DO  Multiple Vitamin (MULTIVITAMIN WITH MINERALS) TABS tablet Take 1 tablet by mouth daily. 01/31/24   Sherrill Cable Latif, DO  naloxone [facility]) nasal spray 4 mg/0.1 mL Place 1 spray into the nose.    [provider]  Nystatin (GERHARDT'S BUTT CREAM) CREA Apply 1 Application topically 3 (three) times daily. 01/30/24   Sherrill Cable Latif, DO  oxyCODONE  (OXY IR/ROXICODONE ) 5 MG immediate release tablet Take 1 tablet (5 mg total) by mouth 5 (five) times daily. 01/30/24   Sherrill Cable Donovan, DO    Social History:  reports that he quit smoking about 6 years ago. His smoking use included cigarettes. He started smoking about 36 years ago. He has a 30 pack-year smoking history. He has never used smokeless tobacco. He reports that he does not currently use alcohol . He reports that he does not use drugs. Lives with husband. Does not currently smoke or drink. Needs assistance with ADLs/IADLs.    Physical Exam: Vitals:   12/07/24 1830 12/07/24 1858 12/07/24 1922 12/07/24 2145  BP: (!) 156/84   (!) 148/57  Pulse: 85   86  Resp: 16   18  Temp:  99.6 F (37.6 C)  99.1 F (37.3 C)  TempSrc:  Oral    SpO2: 99%  99% 95%  Weight:      Height:        Gen: NAD HENT: NCAT CV: normal heart sounds Lung: CTAB Abd: No TTP, normal bowel sounds MSK: No asymmetry, decreased muscle bulk. Left hand contracture. Neuro: alert and oriented x4, CNII-XII grossly intact, good strength and sensation in all extremities.    Labs on Admission: I have personally  reviewed following labs and imaging studies  CBC: Recent Labs  Lab 12/07/24 1155  WBC 9.8  NEUTROABS 7.7  HGB 10.9*  HCT 32.7*  MCV 94.0  PLT 172   Basic Metabolic Panel: Recent Labs  Lab 12/07/24 1155  NA 137  K 4.9  CL 99  CO2 26  GLUCOSE 161*  BUN 48*  CREATININE 2.02*  CALCIUM  9.9   GFR: Estimated Creatinine Clearance: 28.7 mL/min (A) (by C-G formula based on SCr of 2.02 mg/dL (H)). Liver Function Tests: No results for input(s): AST, ALT, ALKPHOS, BILITOT, PROT, ALBUMIN in the last 168 hours. No results for input(s): LIPASE,  AMYLASE in the last 168 hours. No results for input(s): AMMONIA in the last 168 hours. Coagulation Profile: No results for input(s): INR, PROTIME in the last 168 hours. Cardiac Enzymes: No results for input(s): CKTOTAL, CKMB, CKMBINDEX, TROPONINI, TROPONINIHS in the last 168 hours. BNP (last 3 results) No results for input(s): BNP in the last 8760 hours. HbA1C: No results for input(s): HGBA1C in the last 72 hours. CBG: No results for input(s): GLUCAP in the last 168 hours. Lipid Profile: No results for input(s): CHOL, HDL, LDLCALC, TRIG, CHOLHDL, LDLDIRECT in the last 72 hours. Thyroid Function Tests: No results for input(s): TSH, T4TOTAL, FREET4, T3FREE, THYROIDAB in the last 72 hours. Anemia Panel: No results for input(s): VITAMINB12, FOLATE, FERRITIN, TIBC, IRON, RETICCTPCT in the last 72 hours. Urine analysis:    Component Value Date/Time   COLORURINE AMBER (A) 12/07/2024 1858   APPEARANCEUR HAZY (A) 12/07/2024 1858   APPEARANCEUR Clear 11/30/2013 0303   LABSPEC 1.014 12/07/2024 1858   LABSPEC 1.027 11/30/2013 0303   PHURINE 7.0 12/07/2024 1858   GLUCOSEU NEGATIVE 12/07/2024 1858   GLUCOSEU 50 mg/dL 87/76/7985 9696   HGBUR SMALL (A) 12/07/2024 1858   BILIRUBINUR NEGATIVE 12/07/2024 1858   BILIRUBINUR Negative 11/30/2013 0303   KETONESUR NEGATIVE 12/07/2024  1858   PROTEINUR 100 (A) 12/07/2024 1858   NITRITE POSITIVE (A) 12/07/2024 1858   LEUKOCYTESUR LARGE (A) 12/07/2024 1858   LEUKOCYTESUR Negative 11/30/2013 0303    Radiological Exams on Admission: I have personally reviewed images No results found.  EKG: My personal interpretation of EKG shows: Pending  Assessment/Plan Principal Problem:   Complicated urinary tract infection Active Problems:   Benign non-nodular prostatic hyperplasia with lower urinary tract symptoms   CKD (chronic kidney disease) stage 3, GFR 30-59 ml/min (HCC)   Hypertension   Type 2 diabetes mellitus (HCC)   Urinary Tract Infection Pt presented with complaints of dysuria, frequent urination and found to have UTI. Urine and blood culture pending. Pt started on IV Ceftriaxone  1 g daily. -Continue current abx therapy -Follow up cultures -Monitor fever curve and leukocytosis  Pt with AKI. Baseline creatinine is 1.0 and currently at 2.0. Suspect pre-renal etiology.  Renal ultrasound ordered. Will give IVF and trend creatinine. No urinary retention on bladder scan.  -IVF 100 cc/hr for 1 day -Monitor electrolytes daily, creatinine daily. Daily renal panel ordered -Avoid nephrotoxic agents, renally dose meds, use heparin  for VTE ppx.   BPH: continue home med  HTN: hold arb, continue beta blocker, amlodipine  and hydralazine   HLD: continue home statin  T2DM: holding home regimen and starting on SSI. Given his decrease in appetite, this could be discontinued as it can lead to some appetite suppression.   MDD: On mirtazapine 7.5 mg and prozac . Continue and up-titrate if needed on outpatient basis.   VTE prophylaxis:  SQ Heparin   Diet: Heart healthy/carb modified Code Status:  Full Code Telemetry:  Admission status: Inpatient, Telemetry bed Patient is from: Home Anticipated d/c is to: Home Anticipated d/c is in: 2-3 days   Family Communication: Updated at bedside  Consults called: None   Severity of  Illness: The appropriate patient status for this patient is INPATIENT. Inpatient status is judged to be reasonable and necessary in order to provide the required intensity of service to ensure the patient's safety. The patient's presenting symptoms, physical exam findings, and initial radiographic and laboratory data in the context of their chronic comorbidities is felt to place them at high risk for further clinical deterioration. Furthermore, it  is not anticipated that the patient will be medically stable for discharge from the hospital within 2 midnights of admission.   * I certify that at the point of admission it is my clinical judgment that the patient will require inpatient hospital care spanning beyond 2 midnights from the point of admission due to high intensity of service, high risk for further deterioration and high frequency of surveillance required.Herbert Morene Bathe, MD Herbert Chapman. [facility]      [1] No Known Allergies  "

## 2024-12-07 NOTE — ED Provider Notes (Signed)
 "  [facility] Provider Note    Event Date/Time   First MD Initiated Contact with Patient 12/07/24 1457     (approximate)   History   Weakness and Dysuria   HPI  SHAHEEM PICHON is a 78 y.o. male who presents to the ED for evaluation of Weakness and Dysuria   Reviewed PCP visit from September.  History of HTN, DM, HLD, CVA with left hemiparesis, CKD 3.  Patient presents alongside his spouse for evaluation of progressive generalized weakness, poor appetite, dysuria and explicit concern for UTI.  Subjective chills, shivers that were poorly controlled last night.  No emesis or stool changes.  No abdominal pain.   Physical Exam   Triage Vital Signs: ED Triage Vitals  Encounter Vitals Group     BP 12/07/24 1155 131/61     Girls Systolic BP Percentile --      Girls Diastolic BP Percentile --      Boys Systolic BP Percentile --      Boys Diastolic BP Percentile --      Pulse Rate 12/07/24 1155 87     Resp 12/07/24 1155 17     Temp 12/07/24 1155 100.2 F (37.9 C)     Temp Source 12/07/24 1155 Oral     SpO2 12/07/24 1155 100 %     Weight 12/07/24 1154 160 lb 0.9 oz (72.6 kg)     Height 12/07/24 1153 5' 8 (1.727 m)     Head Circumference --      Peak Flow --      Pain Score 12/07/24 1153 0     Pain Loc --      Pain Education --      Exclude from Growth Chart --     Most recent vital signs: Vitals:   12/07/24 1858 12/07/24 1922  BP:    Pulse:    Resp:    Temp: 99.6 F (37.6 C)   SpO2:  99%    General: Awake, no distress.  CV:  Good peripheral perfusion.  Resp:  Normal effort.  Abd:  No distention.  Soft and nontender MSK:  No deformity noted.  Neuro:  Chronic left-sided hemiparesis Other:     ED Results / Procedures / Treatments   Labs (all labs ordered are listed, but only abnormal results are displayed) Labs Reviewed  CBC WITH DIFFERENTIAL/PLATELET - Abnormal; Notable for the following components:      Result Value   RBC 3.48 (*)     Hemoglobin 10.9 (*)    HCT 32.7 (*)    Monocytes Absolute 1.2 (*)    All other components within normal limits  BASIC METABOLIC PANEL WITH GFR - Abnormal; Notable for the following components:   Glucose, Bld 161 (*)    BUN 48 (*)    Creatinine, Ser 2.02 (*)    GFR, Estimated 33 (*)    All other components within normal limits  URINALYSIS, ROUTINE W REFLEX MICROSCOPIC - Abnormal; Notable for the following components:   Color, Urine AMBER (*)    APPearance HAZY (*)    Hgb urine dipstick SMALL (*)    Protein, ur 100 (*)    Nitrite POSITIVE (*)    Leukocytes,Ua LARGE (*)    Bacteria, UA RARE (*)    All other components within normal limits  RESP PANEL BY RT-PCR (RSV, FLU A&B, COVID)  [id number]  URINE CULTURE    EKG   RADIOLOGY   Official radiology report(s): No  results found.  PROCEDURES and INTERVENTIONS:  Procedures  Medications  cefTRIAXone  (ROCEPHIN ) 1 g in sodium chloride  0.9 % 100 mL IVPB (1 g Intravenous New Bag/Given 12/07/24 2005)  lactated ringers  bolus 1,000 mL (0 mLs Intravenous Stopped 12/07/24 1800)     IMPRESSION / MDM / ASSESSMENT AND PLAN / ED COURSE  I reviewed the triage vital signs and the nursing notes.  Differential diagnosis includes, but is not limited to, UTI, sepsis, AKI, viral syndrome  {Patient presents with symptoms of an acute illness or injury that is potentially life-threatening.  Patient presents with generalized weakness and dysuria with signs of an AKI and a UTI requiring medical admission.  No signs of sepsis though borderline temperature is noted.  No leukocytosis.  AKI is noted likely prerenal due to poor intake.  Less likely urologic obstruction.  Negative viral swabs.  Urine with infectious features and sent for culture.  He is provided IV fluids, antibiotics and a consult medicine for admission.  Clinical Course as of 12/07/24 2030  Tue Dec 07, 2024  1945 Reassessed and discussed UA results, UTI, AKI, admission.  Answered  questions [DS]  2010 I consulted medicine who agrees to admit. We discussed getting a bladder scan [DS]    Clinical Course User Index [DS] Claudene Rover, MD     FINAL CLINICAL IMPRESSION(S) / ED DIAGNOSES   Final diagnoses:  Acute cystitis without hematuria  AKI (acute kidney injury)     Rx / DC Orders   ED Discharge Orders     None        Note:  This document was prepared using Dragon voice recognition software and may include unintentional dictation errors.   Claudene Rover, MD 12/07/24 2031  "

## 2024-12-08 ENCOUNTER — Encounter: Payer: Self-pay | Admitting: Internal Medicine

## 2024-12-08 ENCOUNTER — Inpatient Hospital Stay

## 2024-12-08 ENCOUNTER — Other Ambulatory Visit: Payer: Self-pay

## 2024-12-08 DIAGNOSIS — N390 Urinary tract infection, site not specified: Secondary | ICD-10-CM

## 2024-12-08 LAB — BASIC METABOLIC PANEL WITH GFR
Anion gap: 11 (ref 5–15)
BUN: 48 mg/dL — ABNORMAL HIGH (ref 8–23)
CO2: 27 mmol/L (ref 22–32)
Calcium: 9.2 mg/dL (ref 8.9–10.3)
Chloride: 101 mmol/L (ref 98–111)
Creatinine, Ser: 1.83 mg/dL — ABNORMAL HIGH (ref 0.61–1.24)
GFR, Estimated: 37 mL/min — ABNORMAL LOW
Glucose, Bld: 130 mg/dL — ABNORMAL HIGH (ref 70–99)
Potassium: 4.7 mmol/L (ref 3.5–5.1)
Sodium: 139 mmol/L (ref 135–145)

## 2024-12-08 LAB — CBC
HCT: 29.9 % — ABNORMAL LOW (ref 39.0–52.0)
Hemoglobin: 9.9 g/dL — ABNORMAL LOW (ref 13.0–17.0)
MCH: 31 pg (ref 26.0–34.0)
MCHC: 33.1 g/dL (ref 30.0–36.0)
MCV: 93.7 fL (ref 80.0–100.0)
Platelets: 168 K/uL (ref 150–400)
RBC: 3.19 MIL/uL — ABNORMAL LOW (ref 4.22–5.81)
RDW: 12.3 % (ref 11.5–15.5)
WBC: 9.3 K/uL (ref 4.0–10.5)
nRBC: 0 % (ref 0.0–0.2)

## 2024-12-08 LAB — GLUCOSE, CAPILLARY
Glucose-Capillary: 119 mg/dL — ABNORMAL HIGH (ref 70–99)
Glucose-Capillary: 106 mg/dL — ABNORMAL HIGH (ref 70–99)
Glucose-Capillary: 142 mg/dL — ABNORMAL HIGH (ref 70–99)
Glucose-Capillary: 140 mg/dL — ABNORMAL HIGH (ref 70–99)

## 2024-12-08 LAB — HEMOGLOBIN A1C
Hgb A1c MFr Bld: 5.7 % — ABNORMAL HIGH (ref 4.8–5.6)
Mean Plasma Glucose: 116.89 mg/dL

## 2024-12-08 MED ORDER — SODIUM CHLORIDE 0.9 % IV SOLN
1.0000 g | INTRAVENOUS | Status: DC
  Administered 2024-12-08 – 2024-12-09 (×2): 1 g via INTRAVENOUS
  Filled 2024-12-08 (×3): qty 10

## 2024-12-08 MED ORDER — INSULIN ASPART 100 UNIT/ML IJ SOLN
0.0000 [IU] | Freq: Three times a day (TID) | INTRAMUSCULAR | Status: DC
  Administered 2024-12-08: 1 [IU] via SUBCUTANEOUS
  Administered 2024-12-09 (×2): 2 [IU] via SUBCUTANEOUS
  Administered 2024-12-09: 1 [IU] via SUBCUTANEOUS
  Administered 2024-12-10: 2 [IU] via SUBCUTANEOUS
  Filled 2024-12-08 (×3): qty 2
  Filled 2024-12-08: qty 1
  Filled 2024-12-08: qty 2

## 2024-12-08 MED ORDER — INSULIN ASPART 100 UNIT/ML IJ SOLN: 0.0000 [IU] | Freq: Every day | INTRAMUSCULAR | Status: DC

## 2024-12-08 NOTE — Evaluation (Signed)
 Physical Therapy Evaluation Patient Details Name: Herbert Chapman MRN: [id number] DOB: 1946-11-11 Today's Date: 12/08/2024  History of Present Illness  Herbert Chapman is a 78 y.o. year old male with medical history of hypertension, hyperlipidemia, type 2 diabetes, CKD 3A, BPH presenting to the ED with dysuria, weakness and malaise. States he has dysuria for one month. Has not seen PCP or received any abx for this. Denies any fevers or chills. States has poor intake and weakness since his stroke that have worsened over the the last month. On arrival to the ED patient was noted to be HDS stable.  Lab work and imaging obtained.  CBC without leukocytosis, mild anemia.  BMP with AKI, moderate hyperglycemia.  Respiratory panel negative for COVID, flu, RSV.  UA with signs of infection.  Urine culture ordered.  Patient started on IV antibiotics.  Given UTI and functional decline, [facility] contacted for admission.  Clinical Impression  Patient noted to be in supine position at PT arrival in room, for an initial PT evaluation due to a decline in functional status, with baseline mobility reported as needing assistance for transfers, and currently requiring maxA x2 for bed mobility, sit to stand attempt and lateral scoot at bedside. Pt has focal L sided weakness in LE and UE. The patient is A&O x 4, presenting with good willingness to work with PT. The patient resides in a  and lives mobile home with significant other. There are no STE inside the residence. The overall clinical impression is that the patient presents with severe mobility limitations. Recommended skilled PT will address safety, mobility, and discharge planning.        If plan is discharge home, recommend the following: Two people to help with bathing/dressing/bathroom;Two people to help with walking and/or transfers;Help with stairs or ramp for entrance;Assist for transportation;Assistance with cooking/housework;Direct supervision/assist for medications  management   Can travel by private vehicle   No    Equipment Recommendations Other (comment);None recommended by PT (TBD at next level of care)  Recommendations for Other Services       Functional Status Assessment Patient has had a recent decline in their functional status and demonstrates the ability to make significant improvements in function in a reasonable and predictable amount of time.     Precautions / Restrictions Precautions Precautions: Fall Restrictions Weight Bearing Restrictions Per Provider Order: No      Mobility  Bed Mobility Overal bed mobility: Needs Assistance Bed Mobility: Sidelying to Sit, Rolling Rolling: Max assist Sidelying to sit: Max assist, +2 for safety/equipment       General bed mobility comments: 1+ assist for LE and 1+ for trunk    Transfers Overall transfer level: Needs assistance   Transfers: Sit to/from Stand Sit to Stand: +2 physical assistance, Max assist           General transfer comment: attempt to stand at beside pt ultimately no able to fully upright; +1 attempt to initiate transfer simulation +1 modA; pt seemed more comfortable; pt. maxA lateral scoot at bedside for positioning    Ambulation/Gait                  Stairs            Wheelchair Mobility     Tilt Bed    Modified Rankin (Stroke Patients Only)       Balance Overall balance assessment: Needs assistance Sitting-balance support: Bilateral upper extremity supported, Feet supported Sitting balance-Leahy Scale: Poor Sitting balance - Comments:  needed multiple vc to establise midline Postural control: Left lateral lean   Standing balance-Leahy Scale: Zero                               Pertinent Vitals/Pain      Home Living Family/patient expects to be discharged to:: Private residence Living Arrangements: Spouse/significant other Available Help at Discharge: Family;Available 24 hours/day Type of Home: Mobile  home Home Access: Ramped entrance       Home Layout: One level Home Equipment: Tub bench;Wheelchair - manual;BSC/3in1      Prior Function Prior Level of Function : Needs assist                     Extremity/Trunk Assessment   Upper Extremity Assessment Upper Extremity Assessment: Generalized weakness;LUE deficits/detail LUE Deficits / Details: CVA Hx grossly 2-    Lower Extremity Assessment Lower Extremity Assessment: Generalized weakness;LLE deficits/detail LLE Deficits / Details: CVA Hx grossly 2- / 3- non-affected side    Cervical / Trunk Assessment Cervical / Trunk Assessment: Normal  Communication   Communication Communication: No apparent difficulties    Cognition Arousal: Alert Behavior During Therapy: WFL for tasks assessed/performed   PT - Cognitive impairments: No apparent impairments                         Following commands: Intact       Cueing Cueing Techniques: Verbal cues     General Comments      Exercises     Assessment/Plan    PT Assessment Patient needs continued PT services  PT Problem List Decreased strength;Decreased activity tolerance;Decreased balance;Decreased mobility       PT Treatment Interventions Gait training;Functional mobility training;Therapeutic activities;Therapeutic exercise;Balance training;Neuromuscular re-education;Patient/family education    PT Goals (Current goals can be found in the Care Plan section)  Acute Rehab PT Goals Patient Stated Goal: wants to go home PT Goal Formulation: With patient/family Time For Goal Achievement: 12/29/24 Potential to Achieve Goals: Good    Frequency Min 2X/week     Co-evaluation PT/OT/SLP Co-Evaluation/Treatment: Yes Reason for Co-Treatment: Complexity of the patient's impairments (multi-system involvement) PT goals addressed during session: Mobility/safety with mobility OT goals addressed during session: ADL's and self-care       AM-PAC PT 6  Clicks Mobility  Outcome Measure Help needed turning from your back to your side while in a flat bed without using bedrails?: A Lot Help needed moving from lying on your back to sitting on the side of a flat bed without using bedrails?: A Lot Help needed moving to and from a bed to a chair (including a wheelchair)?: Total Help needed standing up from a chair using your arms (e.g., wheelchair or bedside chair)?: Total Help needed to walk in hospital room?: Total Help needed climbing 3-5 steps with a railing? : Total 6 Click Score: 8    End of Session   Activity Tolerance: Patient tolerated treatment well Patient left: in bed;with call bell/phone within reach;with family/visitor present Nurse Communication: Mobility status PT Visit Diagnosis: Other abnormalities of gait and mobility (R26.89);Muscle weakness (generalized) (M62.81);History of falling (Z91.81);Difficulty in walking, not elsewhere classified (R26.2)    Time: 8564-8492 PT Time Calculation (min) (ACUTE ONLY): 32 min   Charges:   PT Evaluation $PT Eval Low Complexity: 1 Low   PT General Charges $$ ACUTE PT VISIT: 1 Visit  Sherlean Lesches DPT, PT    Sherlean DELENA Lesches 12/08/2024, 3:57 PM

## 2024-12-08 NOTE — Progress Notes (Signed)
 " Progress Note   Patient: Herbert Chapman FMW:[id number] DOB: Jul 01, 1946 DOA: 12/07/2024     1 DOS: the patient was seen and examined on 12/08/2024   Brief hospital course:  From HPI Herbert Chapman is a 78 y.o. year old male with medical history of hypertension, hyperlipidemia, type 2 diabetes, CKD 3A, BPH presenting to the ED with dysuria, weakness and malaise. States he has dysuria for one month. Has not seen PCP or received any abx for this. Denies any fevers or chills. States has poor intake and weakness since his stroke that have worsened over the the last month. On arrival to the ED patient was noted to be HDS stable.  Lab work and imaging obtained.  CBC without leukocytosis, mild anemia.  BMP with AKI, moderate hyperglycemia.  Respiratory panel negative for COVID, flu, RSV.  UA with signs of infection.  Urine culture ordered.  Patient started on IV antibiotics.  Given UTI and functional decline, [facility] contacted for admission.    Assessment and Plan:   Urinary Tract Infection Pt presented with complaints of dysuria, frequent urination and found to have UTI.  Will follow-up on culture results Continue ceftriaxone     AKI Continue IV fluid Monitor renal function closely   BPH: continue home med   HTN: hold arb, continue beta blocker, amlodipine  and hydralazine    HLD: continue home statin   T2DM: Continue insulin  therapy Monitor glucose levels   MDD: On mirtazapine 7.5 mg and prozac . Continue and up-titrate if needed on outpatient basis.    VTE prophylaxis:  SQ Heparin   Diet: Heart healthy/carb modified Code Status:  Full Code   Family Communication: Updated at bedside   Consults called: None   Subjective:  Patient seen and examined at bedside this morning Feeling lethargic Denies chest pain abdominal pain cough  Physical Exam:  Gen: Elderly male laying in bed appears lethargic HENT: Normocephalic/atraumatic CV: normal heart sounds Lung: CTAB Abd: No TTP, normal  bowel sounds MSK: No asymmetry, decreased muscle bulk. Left hand contracture. Neuro: alert and oriented x4, CNII-XII grossly intact, good strength and sensation in all extremities.     Vitals:   12/07/24 2145 12/08/24 0237 12/08/24 0429 12/08/24 0815  BP: (!) 148/57  139/70 136/62  Pulse: 86  80 73  Resp: 18  15 18   Temp: 99.1 F (37.3 C)  99.3 F (37.4 C) 99.9 F (37.7 C)  TempSrc:      SpO2: 95%  98% 95%  Weight:  72.9 kg    Height:        Data Reviewed:     Latest Ref Rng & Units 12/08/2024    5:16 AM 12/07/2024   11:55 AM 01/30/2024   12:03 PM  CBC  WBC 4.0 - 10.5 K/uL 9.3  9.8  13.5   Hemoglobin 13.0 - 17.0 g/dL 9.9  89.0  87.3   Hematocrit 39.0 - 52.0 % 29.9  32.7  37.6   Platelets 150 - 400 K/uL 168  172  196        Latest Ref Rng & Units 12/08/2024    5:16 AM 12/07/2024   11:55 AM 01/30/2024    5:59 AM  BMP  Glucose 70 - 99 mg/dL 869  838  98   BUN 8 - 23 mg/dL 48  48  24   Creatinine 0.61 - 1.24 mg/dL 8.16  7.97  9.00   Sodium 135 - 145 mmol/L 139  137  136   Potassium 3.5 - 5.1  mmol/L 4.7  4.9  4.2   Chloride 98 - 111 mmol/L 101  99  101   CO2 22 - 32 mmol/L 27  26  26    Calcium  8.9 - 10.3 mg/dL 9.2  9.9  8.7      Family Communication: Discussed with patient's husband at bedside  Disposition: Status is: Inpatient   Time spent: 51 minutes  Author: Drue ONEIDA Potter, MD 12/08/2024 5:26 PM  For on call review [email or web address]  "

## 2024-12-08 NOTE — Progress Notes (Signed)
" °   12/08/24 1000  Spiritual Encounters  Type of Visit Initial  Care provided to: Patient  Referral source Patient request  Reason for visit Advance directives  Interventions  Spiritual Care Interventions Made Established relationship of care and support;Compassionate presence  Intervention Outcomes  Outcomes Connection to spiritual care;Awareness of support  Spiritual Care Plan  Spiritual Care Issues Still Outstanding No further spiritual care needs at this time (see row info)   Chaplain took advance directives to pt "

## 2024-12-08 NOTE — Evaluation (Signed)
 Occupational Therapy Evaluation Patient Details Name: CASMER YEPIZ MRN: [id number] DOB: 1946/01/26 Today's Date: 12/08/2024   History of Present Illness   REX OESTERLE is a 78 y.o. year old male with medical history of hypertension, hyperlipidemia, type 2 diabetes, CKD 3A, BPH presenting to the ED with dysuria, weakness and malaise. States he has dysuria for one month. Has not seen PCP or received any abx for this. Denies any fevers or chills. States has poor intake and weakness since his stroke that have worsened over the the last month. On arrival to the ED patient was noted to be HDS stable.  Lab work and imaging obtained.  CBC without leukocytosis, mild anemia.  BMP with AKI, moderate hyperglycemia.  Respiratory panel negative for COVID, flu, RSV.  UA with signs of infection.  Urine culture ordered.  Patient started on IV antibiotics.  Given UTI and functional decline, [facility] contacted for admission.     Clinical Impressions Patient presenting with decreased Ind in self care,balance, functional mobility, transfers, endurance, and safety awareness. Patient reports living at home with husband. Husband currently present during evaluation and reports he is pt's caregiver at baseline. He assists pt with stand pivot transfer to wheelchair and car at baseline as well as self care needs.  Patient currently needs max A of 2 for bed mobility and L lateral lean noted with manual facilitation to return to midline. +2 assistance for standing attempts and Max A for lateral scoots towards HOB.Patient will benefit from acute OT to increase overall independence in the areas of ADLs, functional mobility, and safety awareness in order to safely discharge.     If plan is discharge home, recommend the following:   Two people to help with walking and/or transfers;Two people to help with bathing/dressing/bathroom;Help with stairs or ramp for entrance;Assist for transportation     Functional Status Assessment    Patient has had a recent decline in their functional status and demonstrates the ability to make significant improvements in function in a reasonable and predictable amount of time.     Equipment Recommendations   Other (comment) (defer)      Precautions/Restrictions   Precautions Precautions: Fall     Mobility Bed Mobility Overal bed mobility: Needs Assistance Bed Mobility: Sidelying to Sit, Rolling   Sidelying to sit: Max assist, +2 for safety/equipment            Transfers Overall transfer level: Needs assistance   Transfers: Sit to/from Stand, Bed to chair/wheelchair/BSC Sit to Stand: +2 physical assistance, Max assist          Lateral/Scoot Transfers: Max assist, +2 physical assistance        Balance Overall balance assessment: Needs assistance Sitting-balance support: Bilateral upper extremity supported, Feet supported Sitting balance-Leahy Scale: Poor Sitting balance - Comments: needed multiple vc to establise midline Postural control: Left lateral lean   Standing balance-Leahy Scale: Zero                             ADL either performed or assessed with clinical judgement   ADL Overall ADL's : Needs assistance/impaired                                             Vision Baseline Vision/History: 1 Wears glasses Patient Visual Report: No change from baseline  Pertinent Vitals/Pain Pain Assessment Pain Assessment: No/denies pain     Extremity/Trunk Assessment Upper Extremity Assessment Upper Extremity Assessment: Generalized weakness;LUE deficits/detail LUE Deficits / Details: CVA Hx grossly 2-   Lower Extremity Assessment Lower Extremity Assessment: Generalized weakness;LLE deficits/detail LLE Deficits / Details: CVA Hx grossly 2- / 3- non-affected side   Cervical / Trunk Assessment Cervical / Trunk Assessment: Normal   Communication Communication Communication: No apparent  difficulties Factors Affecting Communication: Difficulty expressing self   Cognition Arousal: Alert Behavior During Therapy: WFL for tasks assessed/performed Cognition: No apparent impairments                               Following commands: Intact       Cueing  General Comments   Cueing Techniques: Verbal cues              Home Living Family/patient expects to be discharged to:: Private residence Living Arrangements: Spouse/significant other Available Help at Discharge: Family;Available 24 hours/day Type of Home: Mobile home Home Access: Ramped entrance     Home Layout: One level     Bathroom Shower/Tub: Chief Strategy Officer: Standard     Home Equipment: Tub bench;Wheelchair - manual;BSC/3in1          Prior Functioning/Environment Prior Level of Function : Needs assist             Mobility Comments: Pt transfers with husband to wheelchair only. Pt does not ambulate. ADLs Comments: Husband assists pt with self care needs at baseline.    OT Problem List: Decreased strength;Decreased safety awareness;Decreased activity tolerance;Impaired balance (sitting and/or standing);Decreased knowledge of precautions;Cardiopulmonary status limiting activity   OT Treatment/Interventions: Self-care/ADL training;Therapeutic activities;Therapeutic exercise;Energy conservation;Patient/family education;Balance training      OT Goals(Current goals can be found in the care plan section)   Acute Rehab OT Goals Patient Stated Goal: to get stronger OT Goal Formulation: With patient/family Time For Goal Achievement: 12/22/24 Potential to Achieve Goals: Fair ADL Goals Pt Will Perform Grooming: (P) with supervision;sitting Pt Will Transfer to Toilet: (P) with min assist;bedside commode;stand pivot transfer   OT Frequency:  Min 2X/week    Co-evaluation PT/OT/SLP Co-Evaluation/Treatment: Yes Reason for Co-Treatment: Complexity of the patient's  impairments (multi-system involvement) PT goals addressed during session: Mobility/safety with mobility OT goals addressed during session: ADL's and self-care      AM-PAC OT 6 Clicks Daily Activity     Outcome Measure Help from another person eating meals?: A Little Help from another person taking care of personal grooming?: A Little Help from another person toileting, which includes using toliet, bedpan, or urinal?: A Lot Help from another person bathing (including washing, rinsing, drying)?: A Lot Help from another person to put on and taking off regular upper body clothing?: A Lot Help from another person to put on and taking off regular lower body clothing?: A Lot 6 Click Score: 14   End of Session Nurse Communication: Mobility status  Activity Tolerance: Patient limited by fatigue Patient left: in bed;with call bell/phone within reach;with bed alarm set  OT Visit Diagnosis: Unsteadiness on feet (R26.81);Repeated falls (R29.6);Muscle weakness (generalized) (M62.81)                Time: 8568-8494 OT Time Calculation (min): 34 min Charges:  OT General Charges $OT Visit: 1 Visit OT Evaluation $OT Eval Moderate Complexity: 1 [address] MS, OTR/L , CBIS ascom [phone]  12/08/2024, 4:27  PM

## 2024-12-09 DIAGNOSIS — N390 Urinary tract infection, site not specified: Secondary | ICD-10-CM | POA: Diagnosis not present

## 2024-12-09 LAB — GLUCOSE, CAPILLARY
Glucose-Capillary: 173 mg/dL — ABNORMAL HIGH (ref 70–99)
Glucose-Capillary: 151 mg/dL — ABNORMAL HIGH (ref 70–99)
Glucose-Capillary: 124 mg/dL — ABNORMAL HIGH (ref 70–99)

## 2024-12-09 LAB — CBC WITH DIFFERENTIAL/PLATELET
Abs Immature Granulocytes: 0.07 K/uL (ref 0.00–0.07)
Basophils Absolute: 0 K/uL (ref 0.0–0.1)
Basophils Relative: 0 %
Eosinophils Absolute: 0 K/uL (ref 0.0–0.5)
Eosinophils Relative: 0 %
HCT: 29.9 % — ABNORMAL LOW (ref 39.0–52.0)
Hemoglobin: 10 g/dL — ABNORMAL LOW (ref 13.0–17.0)
Immature Granulocytes: 1 %
Lymphocytes Relative: 4 %
Lymphs Abs: 0.4 K/uL — ABNORMAL LOW (ref 0.7–4.0)
MCH: 31.3 pg (ref 26.0–34.0)
MCHC: 33.4 g/dL (ref 30.0–36.0)
MCV: 93.4 fL (ref 80.0–100.0)
Monocytes Absolute: 0.9 K/uL (ref 0.1–1.0)
Monocytes Relative: 9 %
Neutro Abs: 8.2 K/uL — ABNORMAL HIGH (ref 1.7–7.7)
Neutrophils Relative %: 86 %
Platelets: 184 K/uL (ref 150–400)
RBC: 3.2 MIL/uL — ABNORMAL LOW (ref 4.22–5.81)
RDW: 12.2 % (ref 11.5–15.5)
WBC: 9.5 K/uL (ref 4.0–10.5)
nRBC: 0 % (ref 0.0–0.2)

## 2024-12-09 LAB — BASIC METABOLIC PANEL WITH GFR
Anion gap: 12 (ref 5–15)
BUN: 41 mg/dL — ABNORMAL HIGH (ref 8–23)
CO2: 25 mmol/L (ref 22–32)
Calcium: 8.9 mg/dL (ref 8.9–10.3)
Chloride: 101 mmol/L (ref 98–111)
Creatinine, Ser: 1.72 mg/dL — ABNORMAL HIGH (ref 0.61–1.24)
GFR, Estimated: 40 mL/min — ABNORMAL LOW
Glucose, Bld: 192 mg/dL — ABNORMAL HIGH (ref 70–99)
Potassium: 4 mmol/L (ref 3.5–5.1)
Sodium: 138 mmol/L (ref 135–145)

## 2024-12-09 MED ORDER — FLUOXETINE HCL 20 MG PO CAPS
60.0000 mg | ORAL_CAPSULE | Freq: Every day | ORAL | Status: DC
  Administered 2024-12-10 – 2024-12-11 (×2): 60 mg via ORAL
  Filled 2024-12-09 (×2): qty 3

## 2024-12-09 MED ORDER — ADULT MULTIVITAMIN W/MINERALS CH
1.0000 | ORAL_TABLET | Freq: Every day | ORAL | Status: DC
  Administered 2024-12-09 – 2024-12-11 (×3): 1 via ORAL
  Filled 2024-12-09 (×3): qty 1

## 2024-12-09 MED ORDER — FAMOTIDINE 20 MG PO TABS
20.0000 mg | ORAL_TABLET | Freq: Two times a day (BID) | ORAL | Status: DC
  Administered 2024-12-09 – 2024-12-11 (×4): 20 mg via ORAL
  Filled 2024-12-09 (×4): qty 1

## 2024-12-09 MED ORDER — AMLODIPINE BESYLATE 10 MG PO TABS
10.0000 mg | ORAL_TABLET | Freq: Every day | ORAL | Status: DC
  Administered 2024-12-09 – 2024-12-11 (×3): 10 mg via ORAL
  Filled 2024-12-09 (×3): qty 1

## 2024-12-09 MED ORDER — ATORVASTATIN CALCIUM 10 MG PO TABS
10.0000 mg | ORAL_TABLET | Freq: Every day | ORAL | Status: DC
  Administered 2024-12-09 – 2024-12-11 (×3): 10 mg via ORAL
  Filled 2024-12-09 (×3): qty 1

## 2024-12-09 MED ORDER — HYDRALAZINE HCL 50 MG PO TABS
100.0000 mg | ORAL_TABLET | Freq: Three times a day (TID) | ORAL | Status: DC
  Administered 2024-12-09 – 2024-12-11 (×5): 100 mg via ORAL
  Filled 2024-12-09 (×5): qty 2

## 2024-12-09 MED ORDER — SODIUM CHLORIDE 0.9 % IV SOLN: INTRAVENOUS | Status: AC

## 2024-12-09 MED ORDER — METOPROLOL SUCCINATE ER 100 MG PO TB24
100.0000 mg | ORAL_TABLET | Freq: Every day | ORAL | Status: DC
  Administered 2024-12-09 – 2024-12-11 (×3): 100 mg via ORAL
  Filled 2024-12-09 (×3): qty 1

## 2024-12-09 NOTE — Plan of Care (Signed)
" °  Problem: Clinical Measurements: Goal: Ability to maintain clinical measurements within normal limits will improve Outcome: Progressing Goal: Will remain free from infection Outcome: Progressing Goal: Respiratory complications will improve Outcome: Progressing   Problem: Activity: Goal: Risk for activity intolerance will decrease Outcome: Progressing   Problem: Coping: Goal: Level of anxiety will decrease Outcome: Progressing   Problem: Elimination: Goal: Will not experience complications related to bowel motility Outcome: Progressing Goal: Will not experience complications related to urinary retention Outcome: Progressing   Problem: Safety: Goal: Ability to remain free from injury will improve Outcome: Progressing   Problem: Skin Integrity: Goal: Risk for impaired skin integrity will decrease Outcome: Progressing   Problem: Coping: Goal: Ability to adjust to condition or change in health will improve Outcome: Progressing   Problem: Fluid Volume: Goal: Ability to maintain a balanced intake and output will improve Outcome: Progressing   "

## 2024-12-09 NOTE — Progress Notes (Signed)
 " Progress Note   Patient: AKSHAJ BESANCON FMW:[id number] DOB: 07-18-1946 DOA: 12/07/2024     2 DOS: the patient was seen and examined on 12/09/2024     Brief hospital course:  From HPI MARCELLAS MARCHANT is a 79 y.o. year old male with medical history of hypertension, hyperlipidemia, type 2 diabetes, CKD 3A, BPH presenting to the ED with dysuria, weakness and malaise. States he has dysuria for one month. Has not seen PCP or received any abx for this. Denies any fevers or chills. States has poor intake and weakness since his stroke that have worsened over the the last month. On arrival to the ED patient was noted to be HDS stable.  Lab work and imaging obtained.  CBC without leukocytosis, mild anemia.  BMP with AKI, moderate hyperglycemia.  Respiratory panel negative for COVID, flu, RSV.  UA with signs of infection.  Urine culture ordered.  Patient started on IV antibiotics.  Given UTI and functional decline, [facility] contacted for admission.     Assessment and Plan:   Urinary Tract Infection Pt presented with complaints of dysuria, frequent urination and found to have UTI.  Will follow-up on culture results Continue ceftriaxone      AKI Continue  normal saline IV fluid Monitor renal function closely   BPH: continue home med   HTN: hold arb, continue beta blocker, amlodipine  and hydralazine    HLD: continue home statin   T2DM: Continue insulin  therapy Monitor glucose levels   MDD: On mirtazapine 7.5 mg and prozac . Continue and up-titrate if needed on outpatient basis.    VTE prophylaxis:  SQ Heparin   Diet: Heart healthy/carb modified Code Status:  Full Code   Family Communication: Updated at bedside   Consults called: None     Subjective:  Patient seen and examined at bedside this morning Denies abdominal pain chest pain or cough   Physical Exam:   Gen: Elderly male laying in bed appears lethargic HENT: Normocephalic/atraumatic CV: normal heart sounds Lung: CTAB Abd: No TTP,  normal bowel sounds MSK: No asymmetry, decreased muscle bulk. Left hand contracture. Neuro: alert and oriented x4, CNII-XII grossly intact, good strength and sensation in all extremities.       Data Reviewed:      Latest Ref Rng & Units 12/09/2024    4:12 AM 12/08/2024    5:16 AM 12/07/2024   11:55 AM  BMP  Glucose 70 - 99 mg/dL 807  869  838   BUN 8 - 23 mg/dL 41  48  48   Creatinine 0.61 - 1.24 mg/dL 8.27  8.16  7.97   Sodium 135 - 145 mmol/L 138  139  137   Potassium 3.5 - 5.1 mmol/L 4.0  4.7  4.9   Chloride 98 - 111 mmol/L 101  101  99   CO2 22 - 32 mmol/L 25  27  26    Calcium  8.9 - 10.3 mg/dL 8.9  9.2  9.9        Latest Ref Rng & Units 12/09/2024    4:12 AM 12/08/2024    5:16 AM 12/07/2024   11:55 AM  CBC  WBC 4.0 - 10.5 K/uL 9.5  9.3  9.8   Hemoglobin 13.0 - 17.0 g/dL 89.9  9.9  89.0   Hematocrit 39.0 - 52.0 % 29.9  29.9  32.7   Platelets 150 - 400 K/uL 184  168  172      Vitals:   12/08/24 2027 12/09/24 0258 12/09/24 0443 12/09/24 0444  BP: (!) 119/48 (!) 150/76    Pulse: 64 99    Resp: 16 18    Temp: (!) 97.5 F (36.4 C) (!) 102.6 F (39.2 C) 97.8 F (36.6 C)   TempSrc: Axillary  Oral   SpO2: 96% 95%    Weight:    70 kg  Height:         Author: Drue ONEIDA Potter, MD 12/09/2024 2:10 PM  For on call review [email or web address]  "

## 2024-12-10 DIAGNOSIS — N390 Urinary tract infection, site not specified: Secondary | ICD-10-CM | POA: Diagnosis not present

## 2024-12-10 LAB — GLUCOSE, CAPILLARY
Glucose-Capillary: 113 mg/dL — ABNORMAL HIGH (ref 70–99)
Glucose-Capillary: 163 mg/dL — ABNORMAL HIGH (ref 70–99)
Glucose-Capillary: 105 mg/dL — ABNORMAL HIGH (ref 70–99)
Glucose-Capillary: 107 mg/dL — ABNORMAL HIGH (ref 70–99)

## 2024-12-10 LAB — BASIC METABOLIC PANEL WITH GFR
Anion gap: 10 (ref 5–15)
BUN: 34 mg/dL — ABNORMAL HIGH (ref 8–23)
CO2: 26 mmol/L (ref 22–32)
Calcium: 8.3 mg/dL — ABNORMAL LOW (ref 8.9–10.3)
Chloride: 102 mmol/L (ref 98–111)
Creatinine, Ser: 1.69 mg/dL — ABNORMAL HIGH (ref 0.61–1.24)
GFR, Estimated: 41 mL/min — ABNORMAL LOW
Glucose, Bld: 109 mg/dL — ABNORMAL HIGH (ref 70–99)
Potassium: 3.6 mmol/L (ref 3.5–5.1)
Sodium: 138 mmol/L (ref 135–145)

## 2024-12-10 LAB — URINE CULTURE: Culture: >=100000 — AB

## 2024-12-10 MED ORDER — SODIUM CHLORIDE 0.9 % IV SOLN: INTRAVENOUS | Status: AC

## 2024-12-10 MED ORDER — AMOXICILLIN 500 MG PO CAPS
500.0000 mg | ORAL_CAPSULE | Freq: Three times a day (TID) | ORAL | Status: DC
  Administered 2024-12-10 – 2024-12-11 (×4): 500 mg via ORAL
  Filled 2024-12-10 (×5): qty 1

## 2024-12-10 NOTE — Plan of Care (Signed)
" °  Problem: Education: Goal: Knowledge of General Education information will improve Description: Including pain rating scale, medication(s)/side effects and non-pharmacologic comfort measures Outcome: Progressing   Problem: Health Behavior/Discharge Planning: Goal: Ability to manage health-related needs will improve Outcome: Progressing   Problem: Clinical Measurements: Goal: Ability to maintain clinical measurements within normal limits will improve Outcome: Progressing Goal: Will remain free from infection Outcome: Progressing Goal: Diagnostic test results will improve Outcome: Progressing Goal: Respiratory complications will improve Outcome: Progressing Goal: Cardiovascular complication will be avoided Outcome: Progressing   Problem: Activity: Goal: Risk for activity intolerance will decrease Outcome: Progressing   Problem: Nutrition: Goal: Adequate nutrition will be maintained Outcome: Progressing   Problem: Coping: Goal: Level of anxiety will decrease Outcome: Progressing   Problem: Elimination: Goal: Will not experience complications related to bowel motility Outcome: Progressing Goal: Will not experience complications related to urinary retention Outcome: Progressing   Problem: Pain Managment: Goal: General experience of comfort will improve and/or be controlled Outcome: Progressing   Problem: Safety: Goal: Ability to remain free from injury will improve Outcome: Progressing   Problem: Skin Integrity: Goal: Risk for impaired skin integrity will decrease Outcome: Progressing   Problem: Education: Goal: Ability to describe self-care measures that may prevent or decrease complications (Diabetes Survival Skills Education) will improve Outcome: Progressing Goal: Individualized Educational Video(s) Outcome: Progressing   Problem: Fluid Volume: Goal: Ability to maintain a balanced intake and output will improve Outcome: Progressing   Problem:  Coping: Goal: Ability to adjust to condition or change in health will improve Outcome: Progressing   Problem: Health Behavior/Discharge Planning: Goal: Ability to identify and utilize available resources and services will improve Outcome: Progressing Goal: Ability to manage health-related needs will improve Outcome: Progressing   Problem: Nutritional: Goal: Maintenance of adequate nutrition will improve Outcome: Progressing Goal: Progress toward achieving an optimal weight will improve Outcome: Progressing   Problem: Metabolic: Goal: Ability to maintain appropriate glucose levels will improve Outcome: Progressing   Problem: Skin Integrity: Goal: Risk for impaired skin integrity will decrease Outcome: Progressing   Problem: Tissue Perfusion: Goal: Adequacy of tissue perfusion will improve Outcome: Progressing   "

## 2024-12-10 NOTE — Progress Notes (Signed)
 Physical Therapy Treatment Patient Details Name: Herbert Chapman MRN: [id number] DOB: 08-16-1946 Today's Date: 12/10/2024   History of Present Illness Herbert Chapman is a 79 y.o. year old male with medical history of hypertension, hyperlipidemia, type 2 diabetes, CKD 3A, BPH presenting to the ED with dysuria, weakness and malaise. States he has dysuria for one month. Has not seen PCP or received any abx for this. Denies any fevers or chills. States has poor intake and weakness since his stroke that have worsened over the the last month. On arrival to the ED patient was noted to be HDS stable.  Lab work and imaging obtained.  CBC without leukocytosis, mild anemia.  BMP with AKI, moderate hyperglycemia.  Respiratory panel negative for COVID, flu, RSV.  UA with signs of infection.  Urine culture ordered.  Patient started on IV antibiotics.  Given UTI and functional decline, [facility] contacted for admission.    PT Comments  Patient supine in bed on arrival and agreeable to PT/OT treatment session. Eager to mobilize. Required minA for bed mobility and minA+2 to stand from EOB and take steps towards recliner with HHAx2. Seated rest break after. Stood from medical illustrator with RW and minA+2 and able to ambulate 8' with similar assist but demonstrates ataxic gait pattern and requires assist for L grip placement on RW. Discharge plan updated to reflect current functional level.     If plan is discharge home, recommend the following: A little help with walking and/or transfers;A little help with bathing/dressing/bathroom;Assistance with cooking/housework;Assist for transportation;Help with stairs or ramp for entrance   Can travel by private vehicle     Yes  Equipment Recommendations  None recommended by PT (patient has necessary equipement)    Recommendations for Other Services       Precautions / Restrictions Precautions Precautions: Fall Recall of Precautions/Restrictions: Intact Restrictions Weight Bearing  Restrictions Per Provider Order: No     Mobility  Bed Mobility Overal bed mobility: Needs Assistance Bed Mobility: Supine to Sit     Supine to sit: Min assist          Transfers Overall transfer level: Needs assistance Equipment used: 2 person hand held assist, Rolling Allahna Husband (2 wheels) Transfers: Sit to/from Stand, Bed to chair/wheelchair/BSC Sit to Stand: Min assist, +2 safety/equipment, +2 physical assistance   Step pivot transfers: Min assist, +2 physical assistance, +2 safety/equipment       General transfer comment: first stand with HHAx2 and transferred to recliner with minA+2. On second stand, patient with good technique with hand placement for RW    Ambulation/Gait Ambulation/Gait assistance: Min assist, +2 physical assistance, +2 safety/equipment Gait Distance (Feet): 8 Feet Assistive device: Rolling Lyvia Mondesir (2 wheels) Gait Pattern/deviations: Step-to pattern, Decreased stride length, Ataxic, Leaning posteriorly Gait velocity: decreased     General Gait Details: assist for balance and RW management with L hand. Chair follow for safety. Initially with posterior lean   Stairs             Wheelchair Mobility     Tilt Bed    Modified Rankin (Stroke Patients Only)       Balance Overall balance assessment: Needs assistance Sitting-balance support: Bilateral upper extremity supported, Feet supported Sitting balance-Leahy Scale: Good     Standing balance support: Bilateral upper extremity supported, Reliant on assistive device for balance Standing balance-Leahy Scale: Poor  Communication Communication Communication: No apparent difficulties  Cognition Arousal: Alert Behavior During Therapy: WFL for tasks assessed/performed   PT - Cognitive impairments: No apparent impairments                         Following commands: Intact      Cueing Cueing Techniques: Verbal cues  Exercises       General Comments        Pertinent Vitals/Pain Pain Assessment Pain Assessment: No/denies pain    Home Living                          Prior Function            PT Goals (current goals can now be found in the care plan section) Acute Rehab PT Goals Patient Stated Goal: wants to go home PT Goal Formulation: With patient/family Time For Goal Achievement: 12/29/24 Potential to Achieve Goals: Good Progress towards PT goals: Progressing toward goals    Frequency    Min 2X/week      PT Plan      Co-evaluation PT/OT/SLP Co-Evaluation/Treatment: Yes Reason for Co-Treatment: Complexity of the patient's impairments (multi-system involvement) PT goals addressed during session: Mobility/safety with mobility OT goals addressed during session: ADL's and self-care      AM-PAC PT 6 Clicks Mobility   Outcome Measure  Help needed turning from your back to your side while in a flat bed without using bedrails?: A Little Help needed moving from lying on your back to sitting on the side of a flat bed without using bedrails?: A Little Help needed moving to and from a bed to a chair (including a wheelchair)?: Total Help needed standing up from a chair using your arms (e.g., wheelchair or bedside chair)?: Total Help needed to walk in hospital room?: Total Help needed climbing 3-5 steps with a railing? : Total 6 Click Score: 10    End of Session   Activity Tolerance: Patient tolerated treatment well Patient left: in chair;with call bell/phone within reach;with chair alarm set Nurse Communication: Mobility status PT Visit Diagnosis: Other abnormalities of gait and mobility (R26.89);Muscle weakness (generalized) (M62.81);History of falling (Z91.81);Difficulty in walking, not elsewhere classified (R26.2)     Time: 9068-9045 PT Time Calculation (min) (ACUTE ONLY): 23 min  Charges:    $Therapeutic Activity: 8-22 mins PT General Charges $$ ACUTE PT VISIT: 1 Visit                      Maryanne Finder, PT, DPT Physical Therapist - [facility]  [facility]    Latrel Szymczak A Denym Rahimi 12/10/2024, 10:55 AM

## 2024-12-10 NOTE — Progress Notes (Signed)
 " Progress Note   Patient: Herbert Chapman FMW:[id number] DOB: 1946-03-29 DOA: 12/07/2024     3 DOS: the patient was seen and examined on 12/10/2024      Brief hospital course:  From HPI Herbert Chapman is a 79 y.o. year old male with medical history of hypertension, hyperlipidemia, type 2 diabetes, CKD 3A, BPH presenting to the ED with dysuria, weakness and malaise. States he has dysuria for one month. Has not seen PCP or received any abx for this. Denies any fevers or chills. States has poor intake and weakness since his stroke that have worsened over the the last month. On arrival to the ED patient was noted to be HDS stable.  Lab work and imaging obtained.  CBC without leukocytosis, mild anemia.  BMP with AKI, moderate hyperglycemia.  Respiratory panel negative for COVID, flu, RSV.  UA with signs of infection.  Urine culture ordered.  Patient started on IV antibiotics.  Given UTI and functional decline, TR[facility] contacted for admission.     Assessment and Plan:   Urinary Tract Infection Pt presented with complaints of dysuria, frequent urination and found to have UTI.  Urine cultures growing Proteus Continue ceftriaxone      AKI Continue IV fluid Monitor renal function closely   BPH: continue home med   HTN: hold arb, continue beta blocker, amlodipine  and hydralazine    HLD: continue home statin   T2DM: Continue insulin  therapy Monitor glucose levels   MDD: On mirtazapine 7.5 mg and prozac . Continue and up-titrate if needed on outpatient basis.    VTE prophylaxis:  SQ Heparin   Diet: Heart healthy/carb modified Code Status:  Full Code   Family Communication: Updated at bedside   Consults called: None     Subjective:  Patient seen and examined at bedside this morning Denies abdominal pain chest pain or cough Still feels lethargic but improving   Physical Exam:   Gen: Elderly male laying in bed appears lethargic HENT: Normocephalic/atraumatic CV: normal heart sounds Lung:  CTAB Abd: No TTP, normal bowel sounds MSK: No asymmetry, decreased muscle bulk. Left hand contracture. Neuro: alert and oriented x4, CNII-XII grossly intact, good strength and sensation in all extremities.       Data Reviewed:  Vitals:   12/09/24 2034 12/10/24 0421 12/10/24 0432 12/10/24 0811  BP: (!) 122/56 (!) 116/53 126/63 129/62  Pulse: 73 64 63 65  Resp: 20 16  16   Temp: 99.2 F (37.3 C) 98.7 F (37.1 C) 99.1 F (37.3 C) 98.2 F (36.8 C)  TempSrc: Oral Oral    SpO2: 96% 96% 100% 97%  Weight:      Height:          Latest Ref Rng & Units 12/09/2024    4:12 AM 12/08/2024    5:16 AM 12/07/2024   11:55 AM  CBC  WBC 4.0 - 10.5 K/uL 9.5  9.3  9.8   Hemoglobin 13.0 - 17.0 g/dL 89.9  9.9  89.0   Hematocrit 39.0 - 52.0 % 29.9  29.9  32.7   Platelets 150 - 400 K/uL 184  168  172        Latest Ref Rng & Units 12/10/2024    4:55 AM 12/09/2024    4:12 AM 12/08/2024    5:16 AM  BMP  Glucose 70 - 99 mg/dL 890  807  869   BUN 8 - 23 mg/dL 34  41  48   Creatinine 0.61 - 1.24 mg/dL 8.30  8.27  8.16  Sodium 135 - 145 mmol/L 138  138  139   Potassium 3.5 - 5.1 mmol/L 3.6  4.0  4.7   Chloride 98 - 111 mmol/L 102  101  101   CO2 22 - 32 mmol/L 26  25  27    Calcium  8.9 - 10.3 mg/dL 8.3  8.9  9.2      Author: Drue ONEIDA Potter, MD 12/10/2024 1:15 PM  For on call review [email or web address].  "

## 2024-12-10 NOTE — TOC Initial Note (Signed)
 Transition of Care [facility]) - Initial/Assessment Note    Patient Details  Name: Herbert Chapman MRN: [id number] Date of Birth: 1946/03/13  Transition of Care [facility] CM/SW Contact:    Shasta DELENA Daring, RN Phone Number: 12/10/2024, 5:25 PM  Clinical Narrative:                 [facility] met with patient and his husband at bedside. Patient lives in trailer with husband. No HH services, no DME.  Confirmed PCP.   Plans to return home at discharge. Husband will drive. Amenable to [facility] services including PT, OT. Will initiate search.  Expected Discharge Plan: Home w Home Health Services Barriers to Discharge: Continued Medical Work up   Patient Goals and CMS Choice            Expected Discharge Plan and Services In-house Referral: Clinical Social Work Discharge Planning Services: CM Consult                                          Prior Living Arrangements/Services   Lives with:: Spouse Patient language and need for interpreter reviewed:: Yes        Need for Family Participation in Patient Care: Yes (Comment) Care giver support system in place?: Yes (comment)   Criminal Activity/Legal Involvement Pertinent to Current Situation/Hospitalization: No - Comment as needed  Activities of Daily Living   ADL Screening (condition at time of admission) Independently performs ADLs?: Yes (appropriate for developmental age) Is the patient deaf or have difficulty hearing?: Yes Does the patient have difficulty seeing, even when wearing glasses/contacts?: No Does the patient have difficulty concentrating, remembering, or making decisions?: No  Permission Sought/Granted Permission sought to share information with : Case Manager Permission granted to share information with : Yes, Verbal Permission Granted              Emotional Assessment Appearance:: Appears stated age Attitude/Demeanor/Rapport: Gracious, Engaged Affect (typically observed): Appropriate, Calm Orientation: : Oriented to  Self, Oriented to Place, Oriented to  Time, Oriented to Situation Alcohol  / Substance Use: Not Applicable Psych Involvement: No (comment)  Admission diagnosis:  Complicated urinary tract infection [N39.0] Acute cystitis without hematuria [N30.00] AKI (acute kidney injury) [N17.9] Patient Active Problem List   Diagnosis Date Noted   Complicated urinary tract infection 12/07/2024   Right thalamic stroke (HCC) 01/23/2024   Acute hypoxemic respiratory failure (HCC) 01/23/2024   Stroke, hemorrhagic (HCC) 01/23/2024   ICH (intracerebral hemorrhage) (HCC) 01/22/2024   Status post total shoulder arthroplasty, right 10/13/2019   Depression 01/13/2017   Hepatitis C 01/13/2017   Hypertension 01/13/2017   Squamous cell carcinoma 01/13/2017   Type 2 diabetes mellitus (HCC) 01/13/2017   Atherosclerosis 02/23/2016   CKD (chronic kidney disease) stage 3, GFR 30-59 ml/min (HCC) 02/23/2016   Personal history of tobacco use, presenting hazards to health 11/07/2015   Benign non-nodular prostatic hyperplasia with lower urinary tract symptoms 03/16/2015   Chronic back pain 03/16/2015   PCP:  Rudolpho Norleen BIRCH, MD Pharmacy:   [facility] - [place], [place] - [address] [place] [place] [address] Phone: [phone] Fax: [phone]     Social Drivers of Health (SDOH) Social History: SDOH Screenings   Food Insecurity: No Food Insecurity (12/07/2024)  Housing: Unknown (12/08/2024)  Transportation Needs: No Transportation Needs (12/08/2024)  Utilities: Not At Risk (12/08/2024)  Financial Resource Strain: Low Risk  (  05/20/2024)   Received from [facility]  Social Connections: Socially Integrated (12/08/2024)  Tobacco Use: Medium Risk (12/08/2024)   SDOH Interventions:     Readmission Risk Interventions     No data to display

## 2024-12-10 NOTE — Progress Notes (Signed)
 Occupational Therapy Treatment Patient Details Name: Herbert Chapman MRN: [id number] DOB: 11-21-1946 Today's Date: 12/10/2024   History of present illness Herbert Chapman is a 79 y.o. year old male with medical history of hypertension, hyperlipidemia, type 2 diabetes, CKD 3A, BPH presenting to the ED with dysuria, weakness and malaise. States he has dysuria for one month. Has not seen PCP or received any abx for this. Denies any fevers or chills. States has poor intake and weakness since his stroke that have worsened over the the last month. On arrival to the ED patient was noted to be HDS stable.  Lab work and imaging obtained.  CBC without leukocytosis, mild anemia.  BMP with AKI, moderate hyperglycemia.  Respiratory panel negative for COVID, flu, RSV.  UA with signs of infection.  Urine culture ordered.  Patient started on IV antibiotics.  Given UTI and functional decline, TRH contacted for admission.   OT comments  Upon entering the room, pt supine in bed and agreeable to OT intervention. Pt is feeling much better and conversational today. Bed mobility performed with min A. Pt maintains seated balance on EOB with supervision. No L lateral lean this session noted. Pt stands with min A of 2 and use of RW to stand pivot to recliner chair. Chair alarm activated and all needs within reach. Call bell and all needed items within reach.       If plan is discharge home, recommend the following:  Help with stairs or ramp for entrance;Assist for transportation;A little help with walking and/or transfers;A little help with bathing/dressing/bathroom   Equipment Recommendations  None recommended by OT       Precautions / Restrictions Precautions Precautions: Fall Recall of Precautions/Restrictions: Intact       Mobility Bed Mobility Overal bed mobility: Needs Assistance Bed Mobility: Supine to Sit     Supine to sit: Min assist          Transfers Overall transfer level: Needs assistance Equipment  used: 2 person hand held assist, Rolling walker (2 wheels) Transfers: Sit to/from Stand, Bed to chair/wheelchair/BSC Sit to Stand: Min assist, +2 safety/equipment, +2 physical assistance     Step pivot transfers: Min assist, +2 physical assistance, +2 safety/equipment     General transfer comment: first stand with HHAx2 and transferred to recliner with minA+2. On second stand, patient with good technique with hand placement for RW     Balance Overall balance assessment: Needs assistance Sitting-balance support: Bilateral upper extremity supported, Feet supported Sitting balance-Leahy Scale: Good   Postural control: Left lateral lean Standing balance support: Bilateral upper extremity supported, Reliant on assistive device for balance                               ADL either performed or assessed with clinical judgement     Vision Patient Visual Report: No change from baseline           Communication Communication Communication: No apparent difficulties Factors Affecting Communication: Difficulty expressing self   Cognition Arousal: Alert Behavior During Therapy: WFL for tasks assessed/performed Cognition: No apparent impairments                               Following commands: Intact        Cueing   Cueing Techniques: Verbal cues             Pertinent  Vitals/ Pain       Pain Assessment Pain Assessment: No/denies pain         Frequency  Min 2X/week        Progress Toward Goals  OT Goals(current goals can now be found in the care plan section)  Progress towards OT goals: Progressing toward goals         Co-evaluation    PT/OT/SLP Co-Evaluation/Treatment: Yes Reason for Co-Treatment: Complexity of the patient's impairments (multi-system involvement) PT goals addressed during session: Mobility/safety with mobility OT goals addressed during session: ADL's and self-care      AM-PAC OT 6 Clicks Daily Activity      Outcome Measure   Help from another person eating meals?: A Little Help from another person taking care of personal grooming?: A Little Help from another person toileting, which includes using toliet, bedpan, or urinal?: A Lot Help from another person bathing (including washing, rinsing, drying)?: A Lot Help from another person to put on and taking off regular upper body clothing?: A Little Help from another person to put on and taking off regular lower body clothing?: A Lot 6 Click Score: 15    End of Session Equipment Utilized During Treatment: Rolling walker (2 wheels)  OT Visit Diagnosis: Unsteadiness on feet (R26.81);Repeated falls (R29.6);Muscle weakness (generalized) (M62.81)   Activity Tolerance Patient limited by fatigue   Patient Left in bed;with call bell/phone within reach;with bed alarm set   Nurse Communication Mobility status        Time: 9068-9045 OT Time Calculation (min): 23 min  Charges: OT General Charges $OT Visit: 1 Visit OT Treatments $Therapeutic Activity: 8-22 mins  Izetta Claude, MS, OTR/L , CBIS ascom [phone]  12/10/2024, 1:25 PM

## 2024-12-11 ENCOUNTER — Other Ambulatory Visit: Payer: Self-pay

## 2024-12-11 DIAGNOSIS — N390 Urinary tract infection, site not specified: Secondary | ICD-10-CM | POA: Diagnosis not present

## 2024-12-11 LAB — BASIC METABOLIC PANEL WITH GFR
Anion gap: 12 (ref 5–15)
BUN: 29 mg/dL — ABNORMAL HIGH (ref 8–23)
CO2: 24 mmol/L (ref 22–32)
Calcium: 8.8 mg/dL — ABNORMAL LOW (ref 8.9–10.3)
Chloride: 104 mmol/L (ref 98–111)
Creatinine, Ser: 1.59 mg/dL — ABNORMAL HIGH (ref 0.61–1.24)
GFR, Estimated: 44 mL/min — ABNORMAL LOW
Glucose, Bld: 117 mg/dL — ABNORMAL HIGH (ref 70–99)
Potassium: 3.5 mmol/L (ref 3.5–5.1)
Sodium: 139 mmol/L (ref 135–145)

## 2024-12-11 LAB — GLUCOSE, CAPILLARY
Glucose-Capillary: 118 mg/dL — ABNORMAL HIGH (ref 70–99)
Glucose-Capillary: 113 mg/dL — ABNORMAL HIGH (ref 70–99)

## 2024-12-11 LAB — CBC WITH DIFFERENTIAL/PLATELET
Abs Immature Granulocytes: 0.08 K/uL — ABNORMAL HIGH (ref 0.00–0.07)
Basophils Absolute: 0 K/uL (ref 0.0–0.1)
Basophils Relative: 0 %
Eosinophils Absolute: 0.1 K/uL (ref 0.0–0.5)
Eosinophils Relative: 1 %
HCT: 29.3 % — ABNORMAL LOW (ref 39.0–52.0)
Hemoglobin: 9.7 g/dL — ABNORMAL LOW (ref 13.0–17.0)
Immature Granulocytes: 1 %
Lymphocytes Relative: 9 %
Lymphs Abs: 0.8 K/uL (ref 0.7–4.0)
MCH: 30.9 pg (ref 26.0–34.0)
MCHC: 33.1 g/dL (ref 30.0–36.0)
MCV: 93.3 fL (ref 80.0–100.0)
Monocytes Absolute: 0.7 K/uL (ref 0.1–1.0)
Monocytes Relative: 7 %
Neutro Abs: 7.7 K/uL (ref 1.7–7.7)
Neutrophils Relative %: 82 %
Platelets: 200 K/uL (ref 150–400)
RBC: 3.14 MIL/uL — ABNORMAL LOW (ref 4.22–5.81)
RDW: 12.3 % (ref 11.5–15.5)
WBC: 9.4 K/uL (ref 4.0–10.5)
nRBC: 0 % (ref 0.0–0.2)

## 2024-12-11 MED ORDER — GERHARDT'S BUTT CREAM
TOPICAL_CREAM | Freq: Two times a day (BID) | CUTANEOUS | Status: DC
  Filled 2024-12-11: qty 60

## 2024-12-11 MED ORDER — SENNOSIDES-DOCUSATE SODIUM 8.6-50 MG PO TABS
1.0000 | ORAL_TABLET | Freq: Every evening | ORAL | 0 refills | Status: AC | PRN
  Filled 2024-12-11: qty 20, 20d supply, fill #0

## 2024-12-11 MED ORDER — AMOXICILLIN 500 MG PO CAPS
500.0000 mg | ORAL_CAPSULE | Freq: Three times a day (TID) | ORAL | 0 refills | Status: AC
  Filled 2024-12-11: qty 15, 5d supply, fill #0

## 2024-12-11 MED ORDER — ONDANSETRON HCL 4 MG PO TABS
4.0000 mg | ORAL_TABLET | Freq: Four times a day (QID) | ORAL | 0 refills | Status: AC | PRN
  Filled 2024-12-11: qty 20, 5d supply, fill #0

## 2024-12-11 NOTE — Discharge Summary (Signed)
 " Physician Discharge Summary   Patient: Herbert Chapman MRN: [id number] DOB: Apr 25, 1946  Admit date:     12/07/2024  Discharge date: 12/11/2024  Discharge Physician: Drue ONEIDA Potter   PCP: Rudolpho Norleen BIRCH, MD   Recommendations at discharge:  Continue to follow-up with PCP  Discharge Diagnoses: Principal Problem:   Complicated urinary tract infection Active Problems:   Benign non-nodular prostatic hyperplasia with lower urinary tract symptoms   CKD (chronic kidney disease) stage 3, GFR 30-59 ml/min (HCC)   Hypertension   Type 2 diabetes mellitus (HCC)  Resolved Problems:   * No resolved hospital problems. [facility] Course:   From HPI Herbert Chapman is a 79 y.o. year old male with medical history of hypertension, hyperlipidemia, type 2 diabetes, CKD 3A, BPH presenting to the ED with dysuria, weakness and malaise. States he has dysuria for one month. Has not seen PCP or received any abx for this. Denies any fevers or chills. States has poor intake and weakness since his stroke that have worsened over the the last month. On arrival to the ED patient was noted to be HDS stable.  Lab work and imaging obtained.  CBC without leukocytosis, mild anemia.  BMP with AKI, moderate hyperglycemia.  Respiratory panel negative for COVID, flu, RSV.  UA with signs of infection.  Urine culture ordered.  Patient started on IV antibiotics.  Given UTI and functional decline, TR[facility] contacted for admission.     Assessment and Plan:   Urinary Tract Infection Pt presented with complaints of dysuria, frequent urination and found to have UTI.  Urine cultures growing Proteus Continue antibiotics Follow-up with PCP Patient was seen by PT OT with recommendation for home health   AKI-improved Monitor renal function closely   BPH: continue home med   HTN: hold arb, continue beta blocker, amlodipine  and hydralazine    HLD: continue home statin   T2DM: Continue insulin  therapy Monitor glucose levels   MDD: On  mirtazapine 7.5 mg and prozac . Continue and up-titrate if needed on outpatient basis.     Consultants: None Procedures performed: None Disposition: Home health Diet recommendation:  Cardiac diet DISCHARGE MEDICATION: Allergies as of 12/11/2024   No Known Allergies      Medication List     STOP taking these medications    lisinopril  40 MG tablet Commonly known as: ZESTRIL    losartan  100 MG tablet Commonly known as: COZAAR    metoprolol  tartrate 100 MG tablet Commonly known as: LOPRESSOR        TAKE these medications    acetaminophen  325 MG tablet Commonly known as: TYLENOL  Take 2 tablets (650 mg total) by mouth every 4 (four) hours as needed for mild pain (pain score 1-3) (or temp > 37.5 C (99.5 F)).   amLODipine  10 MG tablet Commonly known as: NORVASC  Take 1 tablet (10 mg total) by mouth daily.   amoxicillin  500 MG capsule Commonly known as: AMOXIL  Take 1 capsule (500 mg total) by mouth every 8 (eight) hours for 5 days.   aspirin  EC 81 MG tablet Take 1 tablet (81 mg total) by mouth daily. Swallow whole.   atorvastatin  10 MG tablet Commonly known as: LIPITOR Take 10 mg by mouth daily.   doxazosin  4 MG tablet Commonly known as: CARDURA  Take 4 mg by mouth at bedtime.   famotidine  20 MG tablet Commonly known as: PEPCID  Take 1 tablet (20 mg total) by mouth 2 (two) times daily.   FLUoxetine  20 MG capsule Commonly known as: PROZAC   Take 60 mg by mouth every morning.   Gerhardt's butt cream Crea Apply 1 Application topically 3 (three) times daily.   hydrALAZINE  100 MG tablet Commonly known as: APRESOLINE  Take 1 tablet (100 mg total) by mouth every 8 (eight) hours.   insulin  glargine-yfgn 100 UNIT/ML injection Commonly known as: SEMGLEE  Inject 0.25 mLs (25 Units total) into the skin 2 (two) times daily.   ipratropium-albuterol  0.5-2.5 (3) MG/3ML Soln Commonly known as: DUONEB Take 3 mLs by nebulization every 4 (four) hours as needed.   metFORMIN  1000  MG tablet Commonly known as: GLUCOPHAGE  Take 1,000 mg by mouth 2 (two) times daily with a meal.   metoprolol  succinate 100 MG 24 hr tablet Commonly known as: TOPROL -XL Take 100 mg by mouth daily.   mirtazapine 7.5 MG tablet Commonly known as: REMERON Take 7.5 mg by mouth at bedtime.   multivitamin with minerals Tabs tablet Take 1 tablet by mouth daily.   naloxone 4 MG/0.1ML Liqd nasal spray kit Commonly known as: NARCAN Place 1 spray into the nose.   ondansetron  4 MG tablet Commonly known as: ZOFRAN  Take 1 tablet (4 mg total) by mouth every 6 (six) hours as needed for nausea.   oxyCODONE  5 MG immediate release tablet Commonly known as: Oxy IR/ROXICODONE  Take 1 tablet (5 mg total) by mouth 5 (five) times daily.   Stool Softener/Laxative 50-8.6 MG tablet Generic drug: senna-docusate Take 1 tablet by mouth at bedtime as needed for mild constipation.        Contact information for after-discharge care     Home Medical Care     [facility]) .   Service: Home Health Services Contact information: [address] [place]  [address] [phone]                    Discharge Exam: Fredricka Weights   12/08/24 0237 12/09/24 0444 12/11/24 0452  Weight: 72.9 kg 70 kg 70 kg   Gen: Elderly male laying in bed appears lethargic HENT: Normocephalic/atraumatic CV: normal heart sounds Lung: CTAB Abd: No TTP, normal bowel sounds MSK: No asymmetry, decreased muscle bulk. Left hand contracture. Neuro: alert and oriented x4, CNII-XII grossly intact, good strength and sensation in all extremities.     Condition at discharge: good  The results of significant diagnostics from this hospitalization (including imaging, microbiology, ancillary and laboratory) are listed below for reference.   Imaging Studies: US  RENAL Result Date: 12/09/2024 EXAM: US  Retroperitoneum Complete, Renal. 12/08/2024 09:47:19 AM TECHNIQUE: Real-time  ultrasonography of the retroperitoneum renal was performed. COMPARISON: CT abdomen and pelvis dated 12/28/2019. CLINICAL HISTORY: AKI (acute kidney injury). FINDINGS: RIGHT KIDNEY/URETER: Right kidney measures 11.7 x 6.4 x 6.7 cm. Normal cortical echogenicity. Moderate right sided hydronephrosis. No calculus. There are multiple right renal cysts. The largest is in the upper pole measuring 2.8 x 2.9 x 3.0 cm. LEFT KIDNEY/URETER: Left kidney measures 11.0 x 5.1 x 5.4 cm. Normal cortical echogenicity. No hydronephrosis. No calculus. There are at least 2 left renal cysts with the largest in the mid kidney measuring 2.3 x 2.3 x 2.5 cm. BLADDER: Lobulated prominent prostate gland with nodular protrusion into the bladder base. Sludge/layering debris noted in the bladder. IMPRESSION: 1. Moderate right-sided hydronephrosis. 2. Lobulated prominent prostate gland with nodular protrusion into the bladder base. 3. Layering debris/sludge in the bladder. Correlate clinically for infection or hemorrhage. 4. Multiple renal cysts. Electronically signed by: Greig Pique MD 12/09/2024 12:35 AM EST RP Workstation:  [id number]    Microbiology: Results for orders placed or performed during the hospital encounter of 12/07/24  Resp panel by RT-PCR (RSV, Flu A&B, Covid) Anterior Nasal Swab     Status: None   Collection Time: 12/07/24  3:36 PM   Specimen: Anterior Nasal Swab  Result Value Ref Range Status   SARS Coronavirus 2 by RT PCR NEGATIVE NEGATIVE Final    Comment: (NOTE) SARS-CoV-2 target nucleic acids are NOT DETECTED.  The SARS-CoV-2 RNA is generally detectable in upper respiratory specimens during the acute phase of infection. The lowest concentration of SARS-CoV-2 viral copies this assay can detect is 138 copies/mL. A negative result does not preclude SARS-Cov-2 infection and should not be used as the sole basis for treatment or other patient management decisions. A negative result may occur with  improper  specimen collection/handling, submission of specimen other than nasopharyngeal swab, presence of viral mutation(s) within the areas targeted by this assay, and inadequate number of viral copies(<138 copies/mL). A negative result must be combined with clinical observations, patient history, and epidemiological information. The expected result is Negative.  Fact Sheet for Patients:  bloggercourse.com  Fact Sheet for Healthcare Providers:  seriousbroker.it  This test is no t yet approved or cleared by the [place]  FDA and  has been authorized for detection and/or diagnosis of SARS-CoV-2 by FDA under an Emergency Use Authorization (EUA). This EUA will remain  in effect (meaning this test can be used) for the duration of the COVID-19 declaration under Section 564(b)(1) of the Act, 21 U.S.C.section 360bbb-3(b)(1), unless the authorization is terminated  or revoked sooner.       Influenza A by PCR NEGATIVE NEGATIVE Final   Influenza B by PCR NEGATIVE NEGATIVE Final    Comment: (NOTE) The Xpert Xpress SARS-CoV-2/FLU/RSV plus assay is intended as an aid in the diagnosis of influenza from Nasopharyngeal swab specimens and should not be used as a sole basis for treatment. Nasal washings and aspirates are unacceptable for Xpert Xpress SARS-CoV-2/FLU/RSV testing.  Fact Sheet for Patients: bloggercourse.com  Fact Sheet for Healthcare Providers: seriousbroker.it  This test is not yet approved or cleared by the [place]  FDA and has been authorized for detection and/or diagnosis of SARS-CoV-2 by FDA under an Emergency Use Authorization (EUA). This EUA will remain in effect (meaning this test can be used) for the duration of the COVID-19 declaration under Section 564(b)(1) of the Act, 21 U.S.C. section 360bbb-3(b)(1), unless the authorization is terminated or revoked.     Resp  Syncytial Virus by PCR NEGATIVE NEGATIVE Final    Comment: (NOTE) Fact Sheet for Patients: bloggercourse.com  Fact Sheet for Healthcare Providers: seriousbroker.it  This test is not yet approved or cleared by the [place]  FDA and has been authorized for detection and/or diagnosis of SARS-CoV-2 by FDA under an Emergency Use Authorization (EUA). This EUA will remain in effect (meaning this test can be used) for the duration of the COVID-19 declaration under Section 564(b)(1) of the Act, 21 U.S.C. section 360bbb-3(b)(1), unless the authorization is terminated or revoked.  Performed at [facility], [address], [place], [place] [address]   Urine Culture     Status: Abnormal   Collection Time: 12/07/24  6:58 PM   Specimen: Urine, Clean Catch  Result Value Ref Range Status   Specimen Description   Final    URINE, CLEAN CATCH Performed at [facility], [address] [place], [place] [address]    Special Requests   Final  NONE Performed at [facility], [address] [place], [place] [address]    Culture >=100,000 COLONIES/mL PROTEUS MIRABILIS (A)  Final   Report Status 12/10/2024 FINAL  Final   Organism ID, Bacteria PROTEUS MIRABILIS (A)  Final      Susceptibility   Proteus mirabilis - MIC*    AMPICILLIN  <=2 SENSITIVE Sensitive     CEFAZOLIN  (URINE) Value in next row Sensitive      4 SENSITIVEThis is a modified FDA-approved test that has been validated and its performance characteristics determined by the reporting laboratory.  This laboratory is certified under the Clinical Laboratory Improvement Amendments CLIA as qualified to perform high complexity clinical laboratory testing.    CEFEPIME Value in next row Sensitive      4 SENSITIVEThis is a modified FDA-approved test that has been validated and its performance characteristics determined by the reporting laboratory.  This laboratory is  certified under the Clinical Laboratory Improvement Amendments CLIA as qualified to perform high complexity clinical laboratory testing.    ERTAPENEM Value in next row Sensitive      4 SENSITIVEThis is a modified FDA-approved test that has been validated and its performance characteristics determined by the reporting laboratory.  This laboratory is certified under the Clinical Laboratory Improvement Amendments CLIA as qualified to perform high complexity clinical laboratory testing.    CEFTRIAXONE  Value in next row Sensitive      4 SENSITIVEThis is a modified FDA-approved test that has been validated and its performance characteristics determined by the reporting laboratory.  This laboratory is certified under the Clinical Laboratory Improvement Amendments CLIA as qualified to perform high complexity clinical laboratory testing.    CIPROFLOXACIN Value in next row Sensitive      4 SENSITIVEThis is a modified FDA-approved test that has been validated and its performance characteristics determined by the reporting laboratory.  This laboratory is certified under the Clinical Laboratory Improvement Amendments CLIA as qualified to perform high complexity clinical laboratory testing.    GENTAMICIN Value in next row Sensitive      4 SENSITIVEThis is a modified FDA-approved test that has been validated and its performance characteristics determined by the reporting laboratory.  This laboratory is certified under the Clinical Laboratory Improvement Amendments CLIA as qualified to perform high complexity clinical laboratory testing.    NITROFURANTOIN Value in next row Resistant      4 SENSITIVEThis is a modified FDA-approved test that has been validated and its performance characteristics determined by the reporting laboratory.  This laboratory is certified under the Clinical Laboratory Improvement Amendments CLIA as qualified to perform high complexity clinical laboratory testing.    TRIMETH/SULFA Value in next row  Sensitive      4 SENSITIVEThis is a modified FDA-approved test that has been validated and its performance characteristics determined by the reporting laboratory.  This laboratory is certified under the Clinical Laboratory Improvement Amendments CLIA as qualified to perform high complexity clinical laboratory testing.    AMPICILLIN /SULBACTAM Value in next row Sensitive      4 SENSITIVEThis is a modified FDA-approved test that has been validated and its performance characteristics determined by the reporting laboratory.  This laboratory is certified under the Clinical Laboratory Improvement Amendments CLIA as qualified to perform high complexity clinical laboratory testing.    PIP/TAZO Value in next row Sensitive      <=4 SENSITIVEThis is a modified FDA-approved test that has been validated and its performance characteristics determined by the reporting laboratory.  This laboratory is certified under  the Clinical Laboratory Improvement Amendments CLIA as qualified to perform high complexity clinical laboratory testing.    MEROPENEM Value in next row Sensitive      <=4 SENSITIVEThis is a modified FDA-approved test that has been validated and its performance characteristics determined by the reporting laboratory.  This laboratory is certified under the Clinical Laboratory Improvement Amendments CLIA as qualified to perform high complexity clinical laboratory testing.    * >=100,000 COLONIES/mL PROTEUS MIRABILIS  Culture, blood (Routine X 2) w Reflex to ID Panel     Status: None (Preliminary result)   Collection Time: 12/07/24 10:36 PM   Specimen: BLOOD  Result Value Ref Range Status   Specimen Description BLOOD RIGHT ANTECUBITAL  Final   Special Requests   Final    BOTTLES DRAWN AEROBIC AND ANAEROBIC Blood Culture adequate volume   Culture   Final    NO GROWTH 4 DAYS Performed at [facility], [address] [place], [place] [address]    Report Status PENDING  Incomplete  Culture, blood  (Routine X 2) w Reflex to ID Panel     Status: None (Preliminary result)   Collection Time: 12/07/24 10:36 PM   Specimen: BLOOD RIGHT HAND  Result Value Ref Range Status   Specimen Description BLOOD RIGHT HAND  Final   Special Requests   Final    BOTTLES DRAWN AEROBIC AND ANAEROBIC Blood Culture adequate volume   Culture   Final    NO GROWTH 4 DAYS Performed at [facility], [address] [place], [place] [address]    Report Status PENDING  Incomplete    Labs: CBC: Recent Labs  Lab 12/07/24 1155 12/08/24 0516 12/09/24 0412 12/11/24 0500  WBC 9.8 9.3 9.5 9.4  NEUTROABS 7.7  --  8.2* 7.7  HGB 10.9* 9.9* 10.0* 9.7*  HCT 32.7* 29.9* 29.9* 29.3*  MCV 94.0 93.7 93.4 93.3  PLT 172 168 184 200   Basic Metabolic Panel: Recent Labs  Lab 12/07/24 1155 12/07/24 2236 12/08/24 0516 12/09/24 0412 12/10/24 0455 12/11/24 0500  NA 137  --  139 138 138 139  K 4.9  --  4.7 4.0 3.6 3.5  CL 99  --  101 101 102 104  CO2 26  --  27 25 26 24   GLUCOSE 161*  --  130* 192* 109* 117*  BUN 48*  --  48* 41* 34* 29*  CREATININE 2.02*  --  1.83* 1.72* 1.69* 1.59*  CALCIUM  9.9  --  9.2 8.9 8.3* 8.8*  MG  --  1.8  --   --   --   --    Liver Function Tests: No results for input(s): AST, ALT, ALKPHOS, BILITOT, PROT, ALBUMIN in the last 168 hours. CBG: Recent Labs  Lab 12/10/24 1204 12/10/24 1654 12/10/24 2220 12/11/24 0826 12/11/24 1130  GLUCAP 163* 105* 107* 118* 113*    Discharge time spent:  37 minutes.  Signed: Drue ONEIDA Potter, MD [facility] 12/11/2024 "

## 2024-12-11 NOTE — Progress Notes (Signed)
 Mobility Specialist Progress Note:    12/11/24 0903  Therapy Vitals  Pulse Rate 65  Resp 20  BP 132/60  Patient Position (if appropriate) Lying  Oxygen Therapy  SpO2 96 %  O2 Device Room Air  Mobility  Activity Dangled on edge of bed  Level of Assistance Moderate assist, patient does 50-74%  Range of Motion/Exercises Active;All extremities  Mobility visit 1 Mobility  Mobility Specialist Start Time (ACUTE ONLY) 0840  Mobility Specialist Stop Time (ACUTE ONLY) 0908  Mobility Specialist Time Calculation (min) (ACUTE ONLY) 28 min   Pt received in bed, agreeable to mobility. Required ModA to sit EOB, once sitting pt able to control trunk. Upon sitting, pt soiled and awaiting NT for peri care assistance. Pt appears lethargic and weak sitting EOB, slowly lays himself back down. RN called to bedside, VSS. Peri care and bed change performed and pt returned fowlers. Alarm on, all needs met.  Sherrilee Ditty Mobility Specialist Please contact via Special Educational Needs Teacher or  Rehab office at [phone]

## 2024-12-11 NOTE — TOC Transition Note (Signed)
 Transition of Care [facility]) - Discharge Note   Patient Details  Name: Herbert Chapman MRN: [id number] Date of Birth: 01-01-1946  [facility] CM/SW Contact:  Victory Jackquline RAMAN, RN Phone Number: 12/11/2024, 2:38 PM   Clinical Narrative:    Patient discharging home with HH/PT/OT. Spoke with the patient spouse Herbert Chapman @ [phone], RNCM introduced myself and my role and explained that discharge planning would be discussed. RNCM review the [facility] agencies that accepted him and he choose [facility] and said that they had worked with them in the past and were very happy with their services. Patient being transported home by his spouse. No further concerns. RNCM signing off.  Final next level of care: Home w Home Health Services Barriers to Discharge: Barriers Resolved   Patient Goals and CMS Choice            Discharge Placement                Patient to be transferred to facility by: Spouse Name of family member notified: Herbert Chapman Patient and family notified of of transfer: 12/11/24  Discharge Plan and Services Additional resources added to the After Visit Summary for   In-house Referral: Clinical Social Work Discharge Planning Services: CM Consult                      HH Arranged: PT, OT HH Agency: [facility] Date [facility] Agency Contacted: 12/10/24   Representative spoke with at [facility] Agency: Referral's sent via EPIC  Social Drivers of Health (SDOH) Interventions SDOH Screenings   Food Insecurity: No Food Insecurity (12/07/2024)  Housing: Unknown (12/08/2024)  Transportation Needs: No Transportation Needs (12/08/2024)  Utilities: Not At Risk (12/08/2024)  Financial Resource Strain: Low Risk  (05/20/2024)   Received from [facility]  Social Connections: Socially Integrated (12/08/2024)  Tobacco Use: Medium Risk (12/08/2024)     Readmission Risk Interventions     No data to display

## 2024-12-11 NOTE — Progress Notes (Signed)
 Discharge instructions and med details reviewed with patient and family member. All questions answered. Printed AVS given to patient.  Meds delivered by pharmacy. IV removed. Patient was assisted with getting dressed and and transferred into wheelchair.  2 person assistance required for transfers.  Parmvir Boomer G Patino Hernandez, RN

## 2024-12-11 NOTE — Evaluation (Signed)
 Physical Therapy Evaluation Patient Details Name: JULION GATT MRN: [id number] DOB: 1946-10-23 Today's Date: 12/11/2024  History of Present Illness  Herbert Chapman is a 79 y.o. year old male with medical history of hypertension, hyperlipidemia, type 2 diabetes, CKD 3A, BPH presenting to the ED with dysuria, weakness and malaise. States he has dysuria for one month. Has not seen PCP or received any abx for this. Denies any fevers or chills. States has poor intake and weakness since his stroke that have worsened over the the last month. On arrival to the ED patient was noted to be HDS stable.  Lab work and imaging obtained.  CBC without leukocytosis, mild anemia.  BMP with AKI, moderate hyperglycemia.  Respiratory panel negative for COVID, flu, RSV.  UA with signs of infection.  Urine culture ordered.  Patient started on IV antibiotics.  Given UTI and functional decline, [facility] contacted for admission.  Clinical Impression  Patient noted to be in supine position at PT arrival in room, for an initial PT evaluation due to a decline in functional status, with baseline mobility reported as needing assistance, and currently requiring modA for stand pivot tranfer at bedside. The patient is A&O x 3 with cognitive changes compared to baseline, presenting with good willingness to work with PT. The patient resides in a house and lives with spouse with family/friend support. There are no STE inside the residence. The overall clinical impression is that the patient presents with moderate mobility limitations. Recommended skilled PT will address safety, mobility, and discharge planning.        If plan is discharge home, recommend the following: A little help with walking and/or transfers;A little help with bathing/dressing/bathroom;Assistance with cooking/housework;Assist for transportation;Help with stairs or ramp for entrance   Can travel by private vehicle   Yes    Equipment Recommendations None recommended by PT   Recommendations for Other Services       Functional Status Assessment Patient has had a recent decline in their functional status and demonstrates the ability to make significant improvements in function in a reasonable and predictable amount of time.     Precautions / Restrictions Precautions Precautions: Fall Recall of Precautions/Restrictions: Intact Restrictions Weight Bearing Restrictions Per Provider Order: No      Mobility  Bed Mobility Overal bed mobility: Needs Assistance Bed Mobility: Supine to Sit Rolling: Mod assist Sidelying to sit: +2 for safety/equipment, Min assist Supine to sit: Min assist     General bed mobility comments: 1+ assist for LE and 1+ for trunk; +2 for repositioning    Transfers Overall transfer level: Needs assistance Equipment used: 2 person hand held assist, Rolling walker (2 wheels) Transfers: Sit to/from Stand, Bed to chair/wheelchair/BSC Sit to Stand: +2 safety/equipment, Mod assist, Max assist   Step pivot transfers: Min assist      Lateral/Scoot Transfers: Mod assist General transfer comment: caregiver observed sit to stand transfer; vc provided to maximize safety    Ambulation/Gait                  Stairs            Wheelchair Mobility     Tilt Bed    Modified Rankin (Stroke Patients Only)       Balance Overall balance assessment: Needs assistance Sitting-balance support: Bilateral upper extremity supported, Feet supported Sitting balance-Leahy Scale: Good   Postural control: Left lateral lean Standing balance support: Bilateral upper extremity supported, Reliant on assistive device for balance Standing balance-Leahy Scale:  Poor                               Pertinent Vitals/Pain Pain Assessment Pain Assessment: No/denies pain    Home Living Family/patient expects to be discharged to:: Private residence Living Arrangements: Spouse/significant other Available Help at Discharge:  Family;Available 24 hours/day Type of Home: Mobile home Home Access: Ramped entrance       Home Layout: One level Home Equipment: Tub bench;Wheelchair - manual;BSC/3in1      Prior Function Prior Level of Function : Needs assist             Mobility Comments: Pt transfers with husband to wheelchair only. Pt does not ambulate. ADLs Comments: Husband assists pt with self care needs at baseline.     Extremity/Trunk Assessment   Upper Extremity Assessment Upper Extremity Assessment: Generalized weakness;LUE deficits/detail LUE Deficits / Details: CVA Hx grossly 2-    Lower Extremity Assessment Lower Extremity Assessment: Generalized weakness;LLE deficits/detail LLE Deficits / Details: CVA Hx grossly 2- / 3- non-affected side    Cervical / Trunk Assessment Cervical / Trunk Assessment: Normal  Communication   Communication Communication: No apparent difficulties Factors Affecting Communication: Difficulty expressing self    Cognition Arousal: Lethargic, Alert Behavior During Therapy: WFL for tasks assessed/performed   PT - Cognitive impairments: Safety/Judgement, Problem solving, Attention                         Following commands: Impaired Following commands impaired: Follows one step commands inconsistently     Cueing Cueing Techniques: Verbal cues     General Comments      Exercises     Assessment/Plan    PT Assessment Patient needs continued PT services  PT Problem List Decreased strength;Decreased activity tolerance;Decreased balance;Decreased mobility       PT Treatment Interventions Gait training;Functional mobility training;Therapeutic activities;Therapeutic exercise;Balance training;Neuromuscular re-education;Patient/family education    PT Goals (Current goals can be found in the Care Plan section)  Acute Rehab PT Goals Patient Stated Goal: wants to go home PT Goal Formulation: With patient/family Time For Goal Achievement:  01/01/25 Potential to Achieve Goals: Good    Frequency Min 2X/week     Co-evaluation               AM-PAC PT 6 Clicks Mobility  Outcome Measure Help needed turning from your back to your side while in a flat bed without using bedrails?: A Little Help needed moving from lying on your back to sitting on the side of a flat bed without using bedrails?: A Little Help needed moving to and from a bed to a chair (including a wheelchair)?: A Lot Help needed standing up from a chair using your arms (e.g., wheelchair or bedside chair)?: Total Help needed to walk in hospital room?: Total Help needed climbing 3-5 steps with a railing? : Total 6 Click Score: 11    End of Session   Activity Tolerance: Patient tolerated treatment well Patient left: in chair;with call bell/phone within reach;with chair alarm set Nurse Communication: Mobility status PT Visit Diagnosis: Other abnormalities of gait and mobility (R26.89);Muscle weakness (generalized) (M62.81);History of falling (Z91.81);Difficulty in walking, not elsewhere classified (R26.2)    Time: 8673-8596 PT Time Calculation (min) (ACUTE ONLY): 37 min   Charges:   PT Evaluation $PT Eval Low Complexity: 1 Low   PT General Charges $$ ACUTE PT VISIT: 1 Visit  Sherlean Lesches DPT, PT    Sherlean DELENA Lesches 12/11/2024, 2:30 PM

## 2024-12-12 LAB — CULTURE, BLOOD (ROUTINE X 2)
Culture: NO GROWTH
Culture: NO GROWTH
Special Requests: ADEQUATE
Special Requests: ADEQUATE

## 2025-01-17 ENCOUNTER — Ambulatory Visit: Admitting: Urology

## 2025-02-21 ENCOUNTER — Ambulatory Visit: Admit: 2025-02-21 | Admitting: Gastroenterology
